# Patient Record
Sex: Female | Born: 1955 | Race: White | Hispanic: No | Smoking: Current every day smoker
Health system: Southern US, Community
[De-identification: ages and names within clinical notes are randomized; demographics above are authoritative.]

## PROBLEM LIST (undated history)

## (undated) DIAGNOSIS — J449 Chronic obstructive pulmonary disease, unspecified: Secondary | ICD-10-CM

## (undated) DIAGNOSIS — E7800 Pure hypercholesterolemia, unspecified: Secondary | ICD-10-CM

## (undated) DIAGNOSIS — K219 Gastro-esophageal reflux disease without esophagitis: Secondary | ICD-10-CM

## (undated) DIAGNOSIS — F329 Major depressive disorder, single episode, unspecified: Secondary | ICD-10-CM

## (undated) DIAGNOSIS — F32A Depression, unspecified: Secondary | ICD-10-CM

## (undated) DIAGNOSIS — I639 Cerebral infarction, unspecified: Secondary | ICD-10-CM

## (undated) DIAGNOSIS — I700 Atherosclerosis of aorta: Secondary | ICD-10-CM

## (undated) DIAGNOSIS — F419 Anxiety disorder, unspecified: Secondary | ICD-10-CM

## (undated) DIAGNOSIS — M797 Fibromyalgia: Secondary | ICD-10-CM

## (undated) DIAGNOSIS — I10 Essential (primary) hypertension: Secondary | ICD-10-CM

## (undated) HISTORY — DX: Pure hypercholesterolemia, unspecified: E78.00

## (undated) HISTORY — DX: Gastro-esophageal reflux disease without esophagitis: K21.9

## (undated) HISTORY — DX: Chronic obstructive pulmonary disease, unspecified: J44.9

## (undated) HISTORY — PX: TRACHEOSTOMY: SUR1362

## (undated) HISTORY — DX: Fibromyalgia: M79.7

## (undated) HISTORY — DX: Essential (primary) hypertension: I10

---

## 1997-08-12 ENCOUNTER — Ambulatory Visit (HOSPITAL_COMMUNITY): Admission: RE | Admit: 1997-08-12 | Discharge: 1997-08-12 | Payer: Self-pay | Admitting: Gastroenterology

## 1998-05-04 ENCOUNTER — Other Ambulatory Visit: Admission: RE | Admit: 1998-05-04 | Discharge: 1998-05-04 | Payer: Self-pay | Admitting: Obstetrics and Gynecology

## 1999-04-19 ENCOUNTER — Ambulatory Visit (HOSPITAL_COMMUNITY): Admission: RE | Admit: 1999-04-19 | Discharge: 1999-04-19 | Payer: Self-pay | Admitting: Obstetrics and Gynecology

## 1999-04-19 ENCOUNTER — Encounter: Payer: Self-pay | Admitting: Obstetrics and Gynecology

## 1999-08-06 ENCOUNTER — Other Ambulatory Visit: Admission: RE | Admit: 1999-08-06 | Discharge: 1999-08-06 | Payer: Self-pay | Admitting: Obstetrics and Gynecology

## 2000-10-27 ENCOUNTER — Other Ambulatory Visit: Admission: RE | Admit: 2000-10-27 | Discharge: 2000-10-27 | Payer: Self-pay | Admitting: Obstetrics and Gynecology

## 2001-11-09 ENCOUNTER — Other Ambulatory Visit: Admission: RE | Admit: 2001-11-09 | Discharge: 2001-11-09 | Payer: Self-pay | Admitting: Obstetrics and Gynecology

## 2005-01-09 ENCOUNTER — Emergency Department (HOSPITAL_COMMUNITY): Admission: EM | Admit: 2005-01-09 | Discharge: 2005-01-09 | Payer: Self-pay | Admitting: Emergency Medicine

## 2005-01-12 ENCOUNTER — Emergency Department (HOSPITAL_COMMUNITY): Admission: EM | Admit: 2005-01-12 | Discharge: 2005-01-12 | Payer: Self-pay | Admitting: Emergency Medicine

## 2006-05-25 ENCOUNTER — Ambulatory Visit: Payer: Self-pay | Admitting: Family Medicine

## 2006-05-26 ENCOUNTER — Ambulatory Visit: Payer: Self-pay | Admitting: *Deleted

## 2006-06-12 ENCOUNTER — Ambulatory Visit: Payer: Self-pay | Admitting: Family Medicine

## 2006-06-12 ENCOUNTER — Encounter (INDEPENDENT_AMBULATORY_CARE_PROVIDER_SITE_OTHER): Payer: Self-pay | Admitting: *Deleted

## 2013-05-30 ENCOUNTER — Encounter: Payer: Self-pay | Admitting: *Deleted

## 2013-05-30 ENCOUNTER — Ambulatory Visit (INDEPENDENT_AMBULATORY_CARE_PROVIDER_SITE_OTHER): Payer: Medicaid Other | Admitting: Neurology

## 2013-05-30 ENCOUNTER — Encounter (INDEPENDENT_AMBULATORY_CARE_PROVIDER_SITE_OTHER): Payer: Self-pay

## 2013-05-30 VITALS — BP 136/86 | HR 61 | Ht 61.0 in | Wt 109.0 lb

## 2013-05-30 DIAGNOSIS — R269 Unspecified abnormalities of gait and mobility: Secondary | ICD-10-CM

## 2013-05-30 DIAGNOSIS — R209 Unspecified disturbances of skin sensation: Secondary | ICD-10-CM

## 2013-05-30 DIAGNOSIS — G8110 Spastic hemiplegia affecting unspecified side: Secondary | ICD-10-CM

## 2013-05-30 DIAGNOSIS — I619 Nontraumatic intracerebral hemorrhage, unspecified: Secondary | ICD-10-CM | POA: Insufficient documentation

## 2013-05-30 DIAGNOSIS — G890 Central pain syndrome: Secondary | ICD-10-CM

## 2013-05-30 MED ORDER — TRAMADOL HCL 50 MG PO TABS: 50.0000 mg | ORAL_TABLET | Freq: Four times a day (QID) | ORAL | Status: DC | PRN

## 2013-05-30 MED ORDER — TOPIRAMATE 25 MG PO TABS: 25.0000 mg | ORAL_TABLET | Freq: Two times a day (BID) | ORAL | Status: DC

## 2013-05-30 NOTE — Progress Notes (Signed)
[facility] [address] [place]. [place] [address] [phone]       OFFICE CONSULT NOTE  [name] Date of Birth:  Dec 27, 1955 Medical Record Number:  [id number]   Referring MD:  self  Reason for Referral:  ICH f/u  HPI: 19 year Caucasian lady who who is seen today for transfer of neurological care as she has moved to [facility]  In February 2015. 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 I do not have details of hospital admission records but do have available for my review of neurological office visit note from  Doran Stabler, NP from [facility] in [place]. She was hospitalized with right thalamic hemorrhage with intraventricular extension. She initially stayed in the hospital and subsequently in a rehabilitation facility for couple of months and was admitted initially with a hemiwalker. She states she's done well and is not able to walk independently and has finished home physical occupational therapy. She still has left-sided weakness with spasticity and dragging of the left leg. She also has intermittent left facial pain as well as left upper extremity numbness and paresthesias which are bothersome. She takes tramadol 50 mg which seems to help. She tried gabapentin 100 mg 3 times daily but it did not work was admitted sleepy and she stopped it. She could not tolerate Lyrica as well. She feels she has lost some weight  And her appetite is poor. She does have mild depression she states she is coping quite well. She states her blood pressure has been quite well controlled and today it is 136/86 office. At home at times it's been high in the 180s. ROS:   14 system review of systems is positive for weight loss, fatigue, trouble swallowing, , snoring, constipation, easy bruising eye pain, snoring, constipation, increased thirst, flushing, joint pain, cramps, aching muscles,  allergies, skin sensitivity, memory loss, confusion, headache, numbness, dizziness, depression, tumors sleep, decreased energy, change in appetite, disinterest in activities, sleepiness, snoring, restless legs  PMH:  Past Medical History  Diagnosis Date  . Hypertension   . High cholesterol   . Fibromyalgia     Social History:  History   Social History  . Marital Status: Divorced    Spouse Name: N/A    Number of Children: 1  . Years of Education: N/A   Occupational History  . Disablity     Social History Main Topics  . Smoking status: Current Every Day Smoker  . Smokeless tobacco: Not on file  . Alcohol Use: Yes  . Drug Use: No  . Sexual Activity: Not on file   Other Topics Concern  . Not on file   Social History Narrative   Patient is right handed and lives at home with family.Patient drinks caffinated drinks.    Medications:   No current outpatient prescriptions on file prior to visit.   No current facility-administered medications on file prior to visit.    Allergies:   Allergies  Allergen Reactions  . Hydrocodone   . Oxycodone   . Sulfa Antibiotics     Physical Exam General: well developed, well nourished, seated, in no evident distress Head: head normocephalic and atraumatic. Orohparynx benign Neck: supple with no carotid or supraclavicular bruits.mild spasm posterior neck muscles. Cardiovascular: regular rate and rhythm, no murmurs Musculoskeletal: no deformity Skin:  no rash/petichiae Vascular:  Normal pulses all extremities Filed Vitals:   05/30/13 1341  BP: 136/86  Pulse: 61  Neurologic Exam Mental Status: Awake and fully alert. Oriented to place and time. Recent and remote memory intact. Attention span, concentration and fund of knowledge appropriate. Mood and affect appropriate. Mini-Mental status exam score 27/30 with deficits in recall naming and drawing. Animal naming test scores 6. Geriatric depression scale seventh suggestive of mild  depression. Clock drawing 4/4. Cranial Nerves: Fundoscopic exam reveals sharp disc margins. Pupils equal, briskly reactive to light. Extraocular movements full without nystagmus. Visual fields full to confrontation. Hearing intact. Facial sensation intact. Face, tongue, palate moves normally and symmetrically.  Motor: Mild left hemiparesis 4/5 strength with weakness of the left grip, intrinsic hand muscles and left ankle dorsiflexors. Increased tone in the left lower extremity. Normal strength on the right side Sensory.: intact to tough and pinprick and vibratory.  Coordination: Impaired finger-to-nose and knee to his coordination on the left side. Normal on the right side Gait and Station: Arises from chair without difficulty. spastic hemiplegic gait with circumduction and dragging of the left foot and mild left foot drop.  Reflexes: 2+ and asymmetric and brisker on the left. Toes downgoing.   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 Continue aggressive treatment of hypertension with blood pressure goal below 130/90. Continue tramadol for facial pain and try Topamax for neuropathic pain. Referred to outpatient physical and occupational therapy. Return for followup in 2 months with Carla FifeLynn, NP or. call earlier if necessary   Note: This document was prepared with digital dictation and possible smart phrase technology. Any transcriptional errors that result from this process are unintentional.

## 2013-05-30 NOTE — Patient Instructions (Signed)
I had a long discussion with the patient and her sister-in-law regarding her hypertensive intercerebral hemorrhage and post stroke thalamic pain and answered questions. Continue aggressive treatment of hypertension with blood pressure goal below 130/90. Continue tramadol for facial pain and try Topamax for neuropathic pain. Referred to outpatient physical and occupational therapy. Return for followup in 2 months with Larita FifeLynn, NP or. call earlier if necessary

## 2013-06-04 ENCOUNTER — Telehealth: Payer: Self-pay | Admitting: Neurology

## 2013-06-04 NOTE — Telephone Encounter (Signed)
Try to fit in earlier on Lynn`s schedule

## 2013-06-04 NOTE — Telephone Encounter (Signed)
Patient calling to state that she has been having difficulty with her leg feeling stiff and she is forced to use her walker which is unusual for her. Patient also states that the numbness in her mouth is getting worse. Please call and advise patient.

## 2013-06-04 NOTE — Telephone Encounter (Signed)
Pt is calling stating that she has been having difficulty with her leg feeling stiff and she is forced to use walker which is unusual for her. Pt also states that the numbness in her mouth is getting worse. Pt next appt is 09/10/13 with Larita FifeLynn, NP. Please advise

## 2013-06-04 NOTE — Telephone Encounter (Signed)
Called pt to make an appt with Dr. Pearlean BrownieSethi on 06/20/13, because Larita FifeLynn, NP did not have any appt available. I advised the pt that if she has any other problems, questions or concerns to call the office. Pt verbalized understanding.

## 2013-06-12 ENCOUNTER — Telehealth: Payer: Self-pay | Admitting: Neurology

## 2013-06-12 MED ORDER — VENLAFAXINE HCL 75 MG PO TABS: 75.0000 mg | ORAL_TABLET | Freq: Every day | ORAL | Status: DC

## 2013-06-12 NOTE — Telephone Encounter (Signed)
Patient states she does not have a PCP and would like to know if we can prescribe Venlafaxine.  Please advise.  Thank you.

## 2013-06-12 NOTE — Telephone Encounter (Signed)
Ok to do so but encourage her to find a PCP

## 2013-06-12 NOTE — Telephone Encounter (Signed)
Rx has been sent.  I spoke with the patient.  She is actively searching for a PCP.

## 2013-06-12 NOTE — Telephone Encounter (Signed)
Needs refill on generic Effexor

## 2013-06-17 ENCOUNTER — Telehealth: Payer: Self-pay | Admitting: Neurology

## 2013-06-17 NOTE — Telephone Encounter (Signed)
Pt called states she is having severe pain and numbness on the left side of her face during the night. Pt wants to know if Dr. Pearlean BrownieSethi would increase her dosage on her topiramate (TOPAMAX) 25 MG tablet &  traMADol (ULTRAM) 50 MG tablet. Please call pt concerning this matter. Thanks

## 2013-06-18 NOTE — Telephone Encounter (Signed)
I called the patient back.  Got no answer.  Left message. 

## 2013-06-18 NOTE — Telephone Encounter (Signed)
Okay to increase the Topamax to 50 mg twice daily and call back in 3 days

## 2013-06-20 ENCOUNTER — Ambulatory Visit: Payer: Self-pay | Admitting: Neurology

## 2013-06-24 ENCOUNTER — Telehealth: Payer: Self-pay | Admitting: Neurology

## 2013-06-24 NOTE — Telephone Encounter (Signed)
Pt called wanting her refill on amLODipine (NORVASC) 10 MG tablet. Please call once this has been called in. Thanks

## 2013-06-24 NOTE — Telephone Encounter (Signed)
This medication is typically prescribed by PCP.  I called the patient back, left message.

## 2013-06-25 ENCOUNTER — Telehealth: Payer: Self-pay

## 2013-06-25 ENCOUNTER — Other Ambulatory Visit: Payer: Self-pay | Admitting: *Deleted

## 2013-06-25 ENCOUNTER — Telehealth: Payer: Self-pay | Admitting: *Deleted

## 2013-06-25 MED ORDER — AMLODIPINE BESYLATE 10 MG PO TABS: 10.0000 mg | ORAL_TABLET | Freq: Every day | ORAL | Status: DC

## 2013-06-25 NOTE — Telephone Encounter (Signed)
Patient stated she does not currently have a PCP and would like to know if Dr Pearlean BrownieSethi would prescribe Norvasc for her.  She says she is trying to find a PCP, but has not been successful thus far.  Please advise.  Thank you.

## 2013-06-25 NOTE — Telephone Encounter (Signed)
Okay to prescribe till she finds a primary care physician within next 3 months

## 2013-06-25 NOTE — Telephone Encounter (Signed)
Pt stated she's been feeling ill since Sunday.  She's experiencing more mouth numbness, can't eat or keep anything down.  Pt requesting a return call regarding this issues.Marland Kitchen.Marland Kitchen.Thanks

## 2013-06-25 NOTE — Telephone Encounter (Signed)
I called and spoke with the patient.  Advised we will send refill, but she will need to establish PCP within the next 3 months.  She verbalized understanding.

## 2013-06-26 ENCOUNTER — Ambulatory Visit: Payer: Medicaid Other | Attending: Neurology | Admitting: Occupational Therapy

## 2013-06-26 ENCOUNTER — Ambulatory Visit: Payer: Medicaid Other

## 2013-06-26 DIAGNOSIS — R279 Unspecified lack of coordination: Secondary | ICD-10-CM | POA: Insufficient documentation

## 2013-06-26 DIAGNOSIS — Z8673 Personal history of transient ischemic attack (TIA), and cerebral infarction without residual deficits: Secondary | ICD-10-CM | POA: Insufficient documentation

## 2013-06-26 DIAGNOSIS — Z5189 Encounter for other specified aftercare: Secondary | ICD-10-CM | POA: Insufficient documentation

## 2013-06-26 DIAGNOSIS — R262 Difficulty in walking, not elsewhere classified: Secondary | ICD-10-CM | POA: Diagnosis not present

## 2013-06-26 NOTE — Telephone Encounter (Signed)
Called pt and pt stated that she was having numbness in her mouth and I asked the pt if she has an PCP and pt stated that she was going to see one on Saturday and that she was feeling a little better. I advised the pt that if she has any other problems, questions or concerns to call the office. Pt verbalized understanding.

## 2013-06-28 ENCOUNTER — Ambulatory Visit: Payer: Medicaid Other

## 2013-06-28 ENCOUNTER — Ambulatory Visit: Payer: Medicaid Other | Admitting: *Deleted

## 2013-07-02 ENCOUNTER — Encounter: Payer: Self-pay | Admitting: Neurology

## 2013-07-02 ENCOUNTER — Ambulatory Visit (INDEPENDENT_AMBULATORY_CARE_PROVIDER_SITE_OTHER): Payer: Medicaid Other | Admitting: Neurology

## 2013-07-02 VITALS — BP 161/100 | HR 62 | Wt 110.0 lb

## 2013-07-02 DIAGNOSIS — G890 Central pain syndrome: Secondary | ICD-10-CM

## 2013-07-02 DIAGNOSIS — R209 Unspecified disturbances of skin sensation: Secondary | ICD-10-CM

## 2013-07-02 MED ORDER — TOPIRAMATE 50 MG PO TABS: 100.0000 mg | ORAL_TABLET | Freq: Two times a day (BID) | ORAL | Status: DC

## 2013-07-02 NOTE — Progress Notes (Signed)
[facility] [address] [place]. [place] [id number] [phone]       OFFICE  FOLLOW UP VISIT NOTE  Carla Bowen Date of Birth:  27-Sep-1955 Medical Record Number:  [id number]   Referring MD:  self  Reason for Referral:  ICH f/u  HPI:  Update 07/02/2013 : She returns for followup of her last visit in a month ago. She is accompanied by sister. The patient reports persistent tightness and pain in the left base as well as burning and tingling in the left arm. She is tolerating Topamax 50 mg twice daily at present without side effects but still feels that the benefit has most been modest. She had an anxiety episode 2 weeks ago when she woke up from sleep with what appeared to be like a panic attack with crying, sweating, feeling scared. She does have a remote history of panic attacks and anxiety. She also takes Ultram for facial pain which seems to help but makes her sleepy and she takes it only at night. She has no other new complaints today. Initial Consult 05/30/2013 : [age] year Caucasian lady who who is seen today for transfer of neurological care as [place]  [place]. She was [place] in [place] with a right thalamic intracerebral hemorrhage on 12/28/12. I do not have details of hospital admission records but do have available for my review of neurological office visit note from  Doran StablerFaye Blasnak, NP from [facility] [place]. She was hospitalized with right thalamic hemorrhage with intraventricular extension. She initially stayed in the hospital and subsequently in a rehabilitation facility for couple of months and was admitted initially with a hemiwalker. She states she's done well and is not able to walk independently and has finished home physical occupational therapy. She still has left-sided weakness with spasticity and dragging of the left leg. She also has intermittent left facial pain as  well as left upper extremity numbness and paresthesias which are bothersome. She takes tramadol 50 mg which seems to help. She tried gabapentin 100 mg 3 times daily but it did not work was admitted sleepy and she stopped it. She could not tolerate Lyrica as well. She feels she has lost some weight  And her appetite is poor. She does have mild depression she states she is coping quite well. She states her blood pressure has been quite well controlled and today it is 136/86 office. At home at times it's been high in the 180s. ROS:   14 system review of systems is positive for appetite change, chills, fatigue, weight change, facial swelling, eye itching and pain, excessive thirst, nausea, daytime sleepiness, snoring, joint pain and back pain, aching muscles, walking difficulty, neck pain and stiffness, headache and numbness, agitation, confusion and anxiety and nervousness.  PMH:  Past Medical History  Diagnosis Date  . Hypertension   . High cholesterol   . Fibromyalgia Right thalamic intracerebral hemorrhage on October 2014      Social History:  History   Social History  . Marital Status: Divorced    Spouse Name: N/A    Number of Children: 1  . Years of Education: N/A   Occupational History  . Disablity     Social History Main Topics  . Smoking status: Current Every Day Smoker  . Smokeless tobacco: Not on file  . Alcohol Use: Yes  . Drug Use: No  . Sexual Activity: Not on file   Other  Topics Concern  . Not on file   Social History Narrative   Patient is right handed and lives at home with family.Patient drinks caffinated drinks.    Medications:   Current Outpatient Prescriptions on File Prior to Visit  Medication Sig Dispense Refill  . amLODipine (NORVASC) 10 MG tablet Take 1 tablet (10 mg total) by mouth daily.  90 tablet  0  . lisinopril (PRINIVIL,ZESTRIL) 20 MG tablet Take 20 mg by mouth daily.      . traMADol (ULTRAM) 50 MG tablet Take 1 tablet (50 mg total) by mouth  every 6 (six) hours as needed.  30 tablet  1  . venlafaxine (EFFEXOR) 75 MG tablet Take 1 tablet (75 mg total) by mouth daily.  90 tablet  0   No current facility-administered medications on file prior to visit.    Allergies:   Allergies  Allergen Reactions  . Hydrocodone   . Oxycodone   . Sulfa Antibiotics     Physical Exam General: well developed, well nourished, seated, in no evident distress Head: head normocephalic and atraumatic. Orohparynx benign Neck: supple with no carotid or supraclavicular bruits.mild spasm posterior neck muscles. Cardiovascular: regular rate and rhythm, no murmurs Musculoskeletal: no deformity Skin:  no rash/petichiae Vascular:  Normal pulses all extremities Filed Vitals:   07/02/13 1600  BP: 161/100  Pulse: 62    Neurologic Exam Mental Status: Awake and fully alert. Oriented to place and time. Recent and remote memory intact. Attention span, concentration and fund of knowledge appropriate. Mood and affect appropriate. Mini-Mental status exam score 27/30 with deficits in recall naming and drawing. Animal naming test scores 6. Geriatric depression scale seventh suggestive of mild depression. Clock drawing 4/4. Cranial Nerves: Fundoscopic exam reveals sharp disc margins. Pupils equal, briskly reactive to light. Extraocular movements full without nystagmus. Visual fields full to confrontation. Hearing intact. Facial sensation intact. Face, tongue, palate moves normally and symmetrically.  Motor: Mild left hemiparesis 4/5 strength with weakness of the left grip, intrinsic hand muscles and left ankle dorsiflexors. Increased tone in the left lower extremity. Normal strength on the right side Sensory.: intact to tough and pinprick and vibration.  Coordination: Impaired finger-to-nose and knee to his coordination on the left side. Normal on the right side Gait and Station: Arises from chair without difficulty. spastic hemiplegic gait with circumduction and  dragging of the left foot and mild left foot drop.  Reflexes: 2+ and asymmetric and brisker on the left. Toes downgoing.      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 I recommend increasing the Topamax to 50 mg in the morning and 100 nightly for 2 weeks and if still no relief and well-tolerated increase further to 100 mg twice daily. I also advised her to decrease tramadol dose to 12.5 mg every 6 hourly when necessary necessary for pain. Maintain strict control of hypertension with goal below 140/90. Avoid falls and use cane.Return for followup in 3 months or call earlier if necessary.  Note: This document was prepared with digital dictation and possible smart phrase technology. Any transcriptional errors that result from this process are unintentional.

## 2013-07-02 NOTE — Patient Instructions (Signed)
I had a long discussion with the patient and her sister with regardless to her post thalamic pain syndrome,discussed treatment options and answered questions. I recommend increasing the Topamax to 50 mg in the morning and 100 nightly for 2 weeks and if still no relief and well-tolerated increase further to 100 mg twice daily. I also advised her to decrease tramadol dose to 12.5 mg every 6 hourly when necessary necessary for pain. Maintain strict control of hypertension with goal below 140/90. Avoid falls and use cane.Return for followup in 3 months or call earlier if necessary.

## 2013-07-08 ENCOUNTER — Ambulatory Visit: Payer: Medicaid Other

## 2013-07-08 DIAGNOSIS — Z5189 Encounter for other specified aftercare: Secondary | ICD-10-CM | POA: Diagnosis not present

## 2013-07-10 ENCOUNTER — Ambulatory Visit: Payer: Medicaid Other

## 2013-07-11 ENCOUNTER — Ambulatory Visit: Payer: Medicaid Other | Admitting: Occupational Therapy

## 2013-07-11 DIAGNOSIS — Z5189 Encounter for other specified aftercare: Secondary | ICD-10-CM | POA: Diagnosis not present

## 2013-07-16 ENCOUNTER — Telehealth: Payer: Self-pay | Admitting: Neurology

## 2013-07-16 ENCOUNTER — Ambulatory Visit: Payer: Medicaid Other | Attending: Neurology | Admitting: Occupational Therapy

## 2013-07-16 ENCOUNTER — Ambulatory Visit: Payer: Medicaid Other

## 2013-07-16 DIAGNOSIS — Z8673 Personal history of transient ischemic attack (TIA), and cerebral infarction without residual deficits: Secondary | ICD-10-CM | POA: Diagnosis not present

## 2013-07-16 DIAGNOSIS — R262 Difficulty in walking, not elsewhere classified: Secondary | ICD-10-CM | POA: Diagnosis not present

## 2013-07-16 DIAGNOSIS — R279 Unspecified lack of coordination: Secondary | ICD-10-CM | POA: Insufficient documentation

## 2013-07-16 DIAGNOSIS — Z5189 Encounter for other specified aftercare: Secondary | ICD-10-CM | POA: Insufficient documentation

## 2013-07-16 NOTE — Telephone Encounter (Signed)
Patient requesting a call back from Dr. Marlis EdelsonSethi's assistant regarding getting a primary doctor referral.  She did call Medicaid but they di not do referrals for PCP, only behavioral health.  The patient needs prescription for lisinopril. She is totally out. Please call to advise.

## 2013-07-18 ENCOUNTER — Ambulatory Visit: Payer: Medicaid Other

## 2013-07-18 ENCOUNTER — Ambulatory Visit: Payer: Medicaid Other | Admitting: Occupational Therapy

## 2013-07-18 ENCOUNTER — Telehealth: Payer: Self-pay | Admitting: Neurology

## 2013-07-18 DIAGNOSIS — Z5189 Encounter for other specified aftercare: Secondary | ICD-10-CM | POA: Diagnosis not present

## 2013-07-18 MED ORDER — LISINOPRIL 20 MG PO TABS: 20.0000 mg | ORAL_TABLET | Freq: Every day | ORAL | Status: DC

## 2013-07-18 NOTE — Telephone Encounter (Signed)
Dr Pearlean BrownieSethi previously (last month) said we could refill BP med while patient is trying to get PCP.  I called and spoke with the patient.  She is aware.  She is waiting for a call back regarding PCP referral.

## 2013-07-18 NOTE — Telephone Encounter (Signed)
Patient requesting refill of lisinopril. She does not have a PCP in the area yet and is totally out of medication. Please call to advise.

## 2013-07-22 ENCOUNTER — Ambulatory Visit: Payer: Medicaid Other

## 2013-07-22 ENCOUNTER — Ambulatory Visit: Payer: Medicaid Other | Admitting: Occupational Therapy

## 2013-07-22 ENCOUNTER — Telehealth: Payer: Self-pay | Admitting: Neurology

## 2013-07-22 DIAGNOSIS — Z5189 Encounter for other specified aftercare: Secondary | ICD-10-CM | POA: Diagnosis not present

## 2013-07-22 NOTE — Telephone Encounter (Signed)
Called patient and let her know to call social service they can help and direct her.

## 2013-07-22 NOTE — Telephone Encounter (Signed)
Patient calling Larita FifeLynn to inform her that she's still experiencing severe left face and mouth pain.  Please call and advise. Thanks

## 2013-07-23 MED ORDER — GABAPENTIN 300 MG PO CAPS: 300.0000 mg | ORAL_CAPSULE | Freq: Three times a day (TID) | ORAL | Status: DC

## 2013-07-23 NOTE — Telephone Encounter (Signed)
DC topamax. Start gabapentin 300 mg twice daily x 3 days then TID

## 2013-07-23 NOTE — Telephone Encounter (Signed)
Called and left message for more information on face and mouth pain

## 2013-07-23 NOTE — Telephone Encounter (Signed)
Spoke with patient and she said that the pain is so great that she can hardly swallow or take her medicines,dosage was increased by Dr Pearlean BrownieSethi last week but not helping. She is concerned now also that her medicaid is about to discontinue at the end of the month

## 2013-07-24 ENCOUNTER — Ambulatory Visit: Payer: Medicaid Other

## 2013-07-24 ENCOUNTER — Telehealth: Payer: Self-pay | Admitting: *Deleted

## 2013-07-24 ENCOUNTER — Encounter: Payer: Medicaid Other | Admitting: Occupational Therapy

## 2013-07-24 MED ORDER — AMITRIPTYLINE HCL 25 MG PO TABS: ORAL_TABLET | ORAL | Status: DC

## 2013-07-24 NOTE — Telephone Encounter (Signed)
See next message

## 2013-07-24 NOTE — Telephone Encounter (Addendum)
I called pt, LMVM and relayed that Dr. Pearlean BrownieSethi will try amitriptyline 25mg  tabs.  Take 1/2 tablet po qhs x 2wks then go to 1 tablet po qhs.  Pt stated that she had tried the gabapentin 200-400mg  po tid previously and this did not help.  Mentioned MRI.  Dr. Pearlean BrownieSethi did not want to do at this time.

## 2013-07-25 NOTE — Telephone Encounter (Signed)
Ok to try for short time

## 2013-07-29 ENCOUNTER — Ambulatory Visit: Payer: Medicaid Other | Admitting: Occupational Therapy

## 2013-07-29 ENCOUNTER — Telehealth: Payer: Self-pay | Admitting: Neurology

## 2013-07-29 ENCOUNTER — Ambulatory Visit: Payer: Medicaid Other

## 2013-07-29 DIAGNOSIS — Z5189 Encounter for other specified aftercare: Secondary | ICD-10-CM | POA: Diagnosis not present

## 2013-07-30 MED ORDER — GABAPENTIN 100 MG PO CAPS: ORAL_CAPSULE | ORAL | Status: DC

## 2013-07-30 NOTE — Telephone Encounter (Signed)
I have called her, she complains of left facial pain, gabapentin was helping, but 300 mg 3 times a day  has made her too sleepy, I have written her a new prescription gabapentin 100 mg, she can titrating the dosage up gradually from 100 mg 3 times a day, to maximum 3 tablets 3 times a day, new prescription was called in

## 2013-08-01 ENCOUNTER — Ambulatory Visit: Payer: Medicaid Other | Admitting: Occupational Therapy

## 2013-08-01 ENCOUNTER — Ambulatory Visit: Payer: Medicaid Other

## 2013-08-01 DIAGNOSIS — Z5189 Encounter for other specified aftercare: Secondary | ICD-10-CM | POA: Diagnosis not present

## 2013-08-07 ENCOUNTER — Ambulatory Visit: Payer: Medicaid Other

## 2013-08-07 ENCOUNTER — Encounter: Payer: Medicaid Other | Admitting: Occupational Therapy

## 2013-08-08 ENCOUNTER — Emergency Department (HOSPITAL_COMMUNITY)
Admission: EM | Admit: 2013-08-08 | Discharge: 2013-08-08 | Disposition: A | Discharge status: Home / Self Care | Payer: Medicaid Other | Attending: Emergency Medicine | Admitting: Emergency Medicine

## 2013-08-08 ENCOUNTER — Telehealth: Payer: Self-pay | Admitting: Neurology

## 2013-08-08 ENCOUNTER — Emergency Department (HOSPITAL_COMMUNITY): Payer: Medicaid Other

## 2013-08-08 ENCOUNTER — Encounter (HOSPITAL_COMMUNITY): Payer: Self-pay | Admitting: Emergency Medicine

## 2013-08-08 DIAGNOSIS — Z79899 Other long term (current) drug therapy: Secondary | ICD-10-CM | POA: Insufficient documentation

## 2013-08-08 DIAGNOSIS — I10 Essential (primary) hypertension: Secondary | ICD-10-CM

## 2013-08-08 DIAGNOSIS — R112 Nausea with vomiting, unspecified: Secondary | ICD-10-CM | POA: Insufficient documentation

## 2013-08-08 DIAGNOSIS — F17200 Nicotine dependence, unspecified, uncomplicated: Secondary | ICD-10-CM | POA: Insufficient documentation

## 2013-08-08 DIAGNOSIS — Z8673 Personal history of transient ischemic attack (TIA), and cerebral infarction without residual deficits: Secondary | ICD-10-CM | POA: Insufficient documentation

## 2013-08-08 DIAGNOSIS — Z8639 Personal history of other endocrine, nutritional and metabolic disease: Secondary | ICD-10-CM | POA: Insufficient documentation

## 2013-08-08 DIAGNOSIS — R109 Unspecified abdominal pain: Secondary | ICD-10-CM | POA: Insufficient documentation

## 2013-08-08 DIAGNOSIS — Z862 Personal history of diseases of the blood and blood-forming organs and certain disorders involving the immune mechanism: Secondary | ICD-10-CM | POA: Insufficient documentation

## 2013-08-08 HISTORY — DX: Cerebral infarction, unspecified: I63.9

## 2013-08-08 LAB — URINALYSIS, ROUTINE W REFLEX MICROSCOPIC
Bilirubin Urine: NEGATIVE
Glucose, UA: NEGATIVE mg/dL
Hgb urine dipstick: NEGATIVE
Ketones, ur: NEGATIVE mg/dL
Leukocytes, UA: NEGATIVE
Nitrite: NEGATIVE
Protein, ur: NEGATIVE mg/dL
Specific Gravity, Urine: 1.004 — ABNORMAL LOW (ref 1.005–1.030)
Urobilinogen, UA: 0.2 mg/dL (ref 0.0–1.0)
pH: 7.5 (ref 5.0–8.0)

## 2013-08-08 LAB — CBC WITH DIFFERENTIAL/PLATELET
Basophils Absolute: 0 10*3/uL (ref 0.0–0.1)
Basophils Relative: 0 % (ref 0–1)
Eosinophils Absolute: 0 10*3/uL (ref 0.0–0.7)
Eosinophils Relative: 0 % (ref 0–5)
HCT: 39.6 % (ref 36.0–46.0)
Hemoglobin: 13.4 g/dL (ref 12.0–15.0)
Lymphocytes Relative: 28 % (ref 12–46)
Lymphs Abs: 1.9 10*3/uL (ref 0.7–4.0)
MCH: 30.7 pg (ref 26.0–34.0)
MCHC: 33.8 g/dL (ref 30.0–36.0)
MCV: 90.8 fL (ref 78.0–100.0)
Monocytes Absolute: 0.3 10*3/uL (ref 0.1–1.0)
Monocytes Relative: 5 % (ref 3–12)
Neutro Abs: 4.5 10*3/uL (ref 1.7–7.7)
Neutrophils Relative %: 67 % (ref 43–77)
Platelets: 230 10*3/uL (ref 150–400)
RBC: 4.36 MIL/uL (ref 3.87–5.11)
RDW: 13.1 % (ref 11.5–15.5)
WBC: 6.8 10*3/uL (ref 4.0–10.5)

## 2013-08-08 LAB — COMPREHENSIVE METABOLIC PANEL
ALT: 9 U/L (ref 0–35)
AST: 13 U/L (ref 0–37)
Albumin: 4 g/dL (ref 3.5–5.2)
Alkaline Phosphatase: 87 U/L (ref 39–117)
BUN: 11 mg/dL (ref 6–23)
CO2: 27 mEq/L (ref 19–32)
Calcium: 10.1 mg/dL (ref 8.4–10.5)
Chloride: 105 mEq/L (ref 96–112)
Creatinine, Ser: 0.52 mg/dL (ref 0.50–1.10)
GFR calc Af Amer: >90 mL/min (ref >90)
GFR calc non Af Amer: >90 mL/min (ref >90)
Glucose, Bld: 116 mg/dL — ABNORMAL HIGH (ref 70–99)
Potassium: 3.9 mEq/L (ref 3.7–5.3)
Sodium: 144 mEq/L (ref 137–147)
Total Bilirubin: 0.3 mg/dL (ref 0.3–1.2)
Total Protein: 7.2 g/dL (ref 6.0–8.3)

## 2013-08-08 LAB — TROPONIN I: Troponin I: <0.3 ng/mL (ref <0.30)

## 2013-08-08 LAB — CBG MONITORING, ED: Glucose-Capillary: 91 mg/dL (ref 70–99)

## 2013-08-08 LAB — LIPASE, BLOOD: Lipase: 17 U/L (ref 11–59)

## 2013-08-08 MED ORDER — HYDRALAZINE HCL 20 MG/ML IJ SOLN
10.0000 mg | Freq: Once | INTRAMUSCULAR | Status: AC
  Administered 2013-08-08: 10 mg via INTRAVENOUS
  Filled 2013-08-08: qty 0.5

## 2013-08-08 MED ORDER — FENTANYL CITRATE 0.05 MG/ML IJ SOLN
50.0000 ug | Freq: Once | INTRAMUSCULAR | Status: AC
  Administered 2013-08-08: 50 ug via INTRAVENOUS
  Filled 2013-08-08: qty 2

## 2013-08-08 MED ORDER — LANSOPRAZOLE 30 MG PO CPDR: 30.0000 mg | DELAYED_RELEASE_CAPSULE | Freq: Every day | ORAL | Status: DC

## 2013-08-08 MED ORDER — PROMETHAZINE HCL 25 MG/ML IJ SOLN
25.0000 mg | Freq: Once | INTRAMUSCULAR | Status: AC
  Administered 2013-08-08: 25 mg via INTRAVENOUS
  Filled 2013-08-08: qty 1

## 2013-08-08 MED ORDER — IOHEXOL 300 MG/ML  SOLN
100.0000 mL | Freq: Once | INTRAMUSCULAR | Status: AC | PRN
  Administered 2013-08-08: 80 mL via INTRAVENOUS

## 2013-08-08 MED ORDER — PROMETHAZINE HCL 25 MG PO TABS: 25.0000 mg | ORAL_TABLET | Freq: Four times a day (QID) | ORAL | Status: DC | PRN

## 2013-08-08 MED ORDER — SODIUM CHLORIDE 0.9 % IV BOLUS (SEPSIS)
500.0000 mL | Freq: Once | INTRAVENOUS | Status: AC
  Administered 2013-08-08: 500 mL via INTRAVENOUS

## 2013-08-08 MED ORDER — ONDANSETRON HCL 4 MG/2ML IJ SOLN
4.0000 mg | Freq: Once | INTRAMUSCULAR | Status: AC
  Administered 2013-08-08: 4 mg via INTRAVENOUS
  Filled 2013-08-08: qty 2

## 2013-08-08 NOTE — ED Notes (Signed)
MD notified regarding patient's BP.

## 2013-08-08 NOTE — ED Provider Notes (Signed)
CSN: [id number]     Arrival date & time 08/08/13  1623 History   First MD Initiated Contact with Patient 08/08/13 1627     Chief Complaint  Patient presents with  . Abdominal Pain  . Emesis     (Consider location/radiation/quality/duration/timing/severity/associated sxs/prior Treatment) Patient is a 58 y.o. female presenting with abdominal pain and vomiting. The history is provided by the patient.  Abdominal Pain Associated symptoms: nausea and vomiting   Associated symptoms: no chest pain, no constipation, no diarrhea and no shortness of breath   Emesis Associated symptoms: abdominal pain   Associated symptoms: no diarrhea and no headaches    patient presents with upper abdominal pain. It is over the entire abdomen, but worsened the operating room. She's had nausea and vomiting with some mild relief after vomiting. She denies fevers. The pain is constant. It was dull. She has had a decreased appetite. The pain is dull. No masses. She's had a previous cesarean section.  Past Medical History  Diagnosis Date  . Hypertension   . High cholesterol   . Fibromyalgia   . Stroke    Past Surgical History  Procedure Laterality Date  . Cesarean section     Family History  Problem Relation Age of Onset  . Diabetes Brother   . Heart attack Brother   . Hypertension Brother   . Hypertension Sister   . Lung cancer Father    History  Substance Use Topics  . Smoking status: Current Every Day Smoker  . Smokeless tobacco: Not on file  . Alcohol Use: Yes   OB History   Grav Para Term Preterm Abortions TAB SAB Ect Mult Living                 Review of Systems  Constitutional: Negative for activity change and appetite change.  Eyes: Negative for pain.  Respiratory: Negative for chest tightness and shortness of breath.   Cardiovascular: Negative for chest pain and leg swelling.  Gastrointestinal: Positive for nausea, vomiting and abdominal pain. Negative for diarrhea and constipation.   Genitourinary: Negative for flank pain.  Musculoskeletal: Negative for back pain and neck stiffness.  Skin: Negative for rash.  Neurological: Negative for weakness, numbness and headaches.  Psychiatric/Behavioral: Negative for behavioral problems.      Allergies  Hydrocodone; Oxycodone; and Sulfa antibiotics  Home Medications   Prior to Admission medications   Medication Sig Start Date End Date Taking? Authorizing Provider  amLODipine (NORVASC) 10 MG tablet Take 1 tablet (10 mg total) by mouth daily. 06/25/13  Yes Micki Riley, MD  gabapentin (NEURONTIN) 100 MG capsule Take 200 mg by mouth 3 (three) times daily. 2 tablets bid 07/30/13  Yes Levert Feinstein, MD  ibuprofen (ADVIL,MOTRIN) 200 MG tablet Take 400 mg by mouth every 6 (six) hours as needed (pain).   Yes Historical Provider, MD  lisinopril (PRINIVIL,ZESTRIL) 20 MG tablet Take 1 tablet (20 mg total) by mouth daily. 07/18/13  Yes Micki Riley, MD  traMADol (ULTRAM) 50 MG tablet Take 50 mg by mouth every 6 (six) hours as needed (pain). 05/30/13  Yes Micki Riley, MD  venlafaxine (EFFEXOR) 75 MG tablet Take 1 tablet (75 mg total) by mouth daily. 06/12/13  Yes Micki Riley, MD  lansoprazole (PREVACID) 30 MG capsule Take 1 capsule (30 mg total) by mouth daily at 12 noon. 08/08/13   Juliet Rude. Jovahn Breit, MD  promethazine (PHENERGAN) 25 MG tablet Take 1 tablet (25 mg total) by mouth every 6 (  six) hours as needed for nausea. 08/08/13   Juliet RudeNathan R. Dailey Buccheri, MD   BP 144/64  Pulse 62  Temp(Src) 99 F (37.2 C) (Oral)  Resp 16  SpO2 96% Physical Exam  Nursing note and vitals reviewed. Constitutional: She is oriented to person, place, and time. She appears well-developed and well-nourished.  HENT:  Head: Normocephalic and atraumatic.  Eyes: EOM are normal. Pupils are equal, round, and reactive to light.  Neck: Normal range of motion. Neck supple.  Cardiovascular: Normal rate, regular rhythm and normal heart sounds.   No murmur  heard. Pulmonary/Chest: Effort normal and breath sounds normal. No respiratory distress. She has no wheezes. She has no rales.  Abdominal: Soft. She exhibits no distension. There is tenderness. There is no rebound and no guarding.  Diffuse abdominal tenderness, worse in the upper abdomen and somewhat worse on left abdomen. No hernias palpated. No rash.  Musculoskeletal: Normal range of motion.  Neurological: She is alert and oriented to person, place, and time. No cranial nerve deficit.  Skin: Skin is warm and dry.  Psychiatric: She has a normal mood and affect. Her speech is normal.    ED Course  Procedures (including critical care time) Labs Review Labs Reviewed  COMPREHENSIVE METABOLIC PANEL - Abnormal; Notable for the following:    Glucose, Bld 116 (*)    All other components within normal limits  URINALYSIS, ROUTINE W REFLEX MICROSCOPIC - Abnormal; Notable for the following:    Specific Gravity, Urine 1.004 (*)    All other components within normal limits  CBC WITH DIFFERENTIAL  LIPASE, BLOOD  TROPONIN I  CBG MONITORING, ED    Imaging Review [place] Abdomen Complete  08/08/2013   CLINICAL DATA:  Abdominal pain, vomiting  EXAM: ULTRASOUND ABDOMEN COMPLETE  COMPARISON:  CT abdomen and pelvis 08/08/2013  FINDINGS: Gallbladder:  Normally distended without stones or wall thickening.  No pericholecystic fluid or sonographic Murphy sign.  Common bile duct:  Diameter: Normal caliber 4 mm diameter  Liver:  Normal appearance  IVC:  Normal appearance  Pancreas:  Normal appearance  Spleen:  Normal appearance, 8.1 cm length  Right Kidney:  Length: 9.5 cm. Normal morphology without solid mass or hydronephrosis. Small cyst at upper pole 8 mm greatest size, also seen on prior CT.  Left Kidney:  Length: 11.1 cm.  Normal morphology without mass or hydronephrosis.  Abdominal aorta:  Normal caliber  Other findings:  No free-fluid  IMPRESSION: Small RIGHT renal cyst.  Otherwise normal exam.   Electronically  Signed   By: Ulyses SouthwardMark  Boles M.D.   On: 08/08/2013 22:26   Ct Abdomen Pelvis W Contrast  08/08/2013   CLINICAL DATA:  Abdominal pain, nausea and vomiting  EXAM: CT ABDOMEN AND PELVIS WITH CONTRAST  TECHNIQUE: Multidetector CT imaging of the abdomen and pelvis was performed using the standard protocol following bolus administration of intravenous contrast.  CONTRAST:  80mL OMNIPAQUE IOHEXOL 300 MG/ML  SOLN  COMPARISON:  01/09/2005  FINDINGS: Sagittal images of the spine shows disc space flattening with vacuum disc phenomenon and mild posterior disc bulge at L5-S1 level. Mild disc space flattening with mild anterior spurring at L2-L3 level. Lung bases are unremarkable. Minimal fatty infiltration of the liver. Gallbladder is contracted without calcified gallstones. The pancreas spleen and adrenal glands are unremarkable. Kidneys are symmetrical in size and enhancement. No hydronephrosis or hydroureter. There is a cyst in upper pole of the right kidney measures 1.3 cm. There is punctate nonobstructive calcified calculus in upper pole  of the right kidney measures 2 mm.  No hydronephrosis or hydroureter.  No aortic aneurysm.  No small bowel obstruction. No pericecal inflammation. Normal appendix.  The uterus and adnexa is unremarkable. Moderate stool noted within rectum. The urinary bladder is unremarkable.  Delayed renal images shows bilateral renal symmetrical excretion. Bilateral visualized proximal ureter is unremarkable.  IMPRESSION: 1. No acute inflammatory process within abdomen or pelvis. 2. No pericecal inflammation.  Normal appendix. 3. No hydronephrosis or hydroureter. Right nonobstructive nephrolithiasis. 4. No small bowel obstruction.   Electronically Signed   By: Natasha Mead M.D.   On: 08/08/2013 18:40     EKG Interpretation None      MDM   Final diagnoses:  Abdominal pain  Hypertension    Patient with upper abdominal pain. Also had some nausea and vomiting. Lab work and CT reassuring.  Ultrasound done that continued pain it was also reassuring. Patient did have hypertension but improved with treatment. Patient did develop mild headache. Blood pressure much improved. 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 Will discharge home with GI and primary care followup    Juliet Rude. Rubin Payor, MD 08/09/13 [phone]

## 2013-08-08 NOTE — ED Notes (Signed)
Bed: [facility] Expected date:  Expected time:  Means of arrival:  Comments: ems- HTN, n/v

## 2013-08-08 NOTE — ED Notes (Signed)
Per EMS- left sided weakness residual from past stroke and hx HTN. Chief complaint: abd pain since this morning, sharp peri umbilical with N,V. 22G Right Hand. Not taken HTN meds today because she keeps throwing it up. BP 169/123 HR 63 NSR RR 16 SPO2 98% CBG 83. 4 mg zofran given.

## 2013-08-08 NOTE — Telephone Encounter (Signed)
Patient calling requesting additional refills of lisinopril (PRINIVIL,ZESTRIL) 20 MG tabletm, due to medicaid will expire at end of month (refilled on 5/7).  Please call and advise

## 2013-08-08 NOTE — Telephone Encounter (Signed)
I called the patient back.  She realized she already has a Rx on file at [facility].  Nothing further is needed.

## 2013-08-08 NOTE — Discharge Instructions (Signed)
Abdominal Pain, Adult Many things can cause abdominal pain. Usually, abdominal pain is not caused by a disease and will improve without treatment. It can often be observed and treated at home. Your health care provider will do a physical exam and possibly order blood tests and X-rays to help determine the seriousness of your pain. However, in many cases, more time must pass before a clear cause of the pain can be found. Before that point, your health care provider may not know if you need more testing or further treatment. HOME CARE INSTRUCTIONS  Monitor your abdominal pain for any changes. The following actions may help to alleviate any discomfort you are experiencing:  Only take over-the-counter or prescription medicines as directed by your health care provider.  Do not take laxatives unless directed to do so by your health care provider.  Try a clear liquid diet (broth, tea, or water) as directed by your health care provider. Slowly move to a bland diet as tolerated. SEEK MEDICAL CARE IF:  You have unexplained abdominal pain.  You have abdominal pain associated with nausea or diarrhea.  You have pain when you urinate or have a bowel movement.  You experience abdominal pain that wakes you in the night.  You have abdominal pain that is worsened or improved by eating food.  You have abdominal pain that is worsened with eating fatty foods. SEEK IMMEDIATE MEDICAL CARE IF:   Your pain does not go away within 2 hours.  You have a fever.  You keep throwing up (vomiting).  Your pain is felt only in portions of the abdomen, such as the right side or the left lower portion of the abdomen.  You pass bloody or black tarry stools. MAKE SURE YOU:  Understand these instructions.   Will watch your condition.   Will get help right away if you are not doing well or get worse.  Document Released: 12/08/2004 Document Revised: 12/19/2012 Document Reviewed: 11/07/2012 [facility] Patient  Information 2014 [place], [place].  Hypertension As your heart beats, it forces blood through your arteries. This force is your blood pressure. If the pressure is too high, it is called hypertension (HTN) or high blood pressure. HTN is dangerous because you may have it and not know it. High blood pressure may mean that your heart has to work harder to pump blood. Your arteries may be narrow or stiff. The extra work puts you at risk for heart disease, stroke, and other problems.  Blood pressure consists of two numbers, a higher number over a lower, 110/72, for example. It is stated as "110 over 72." The ideal is below 120 for the top number (systolic) and under 80 for the bottom (diastolic). Write down your blood pressure today. You should pay close attention to your blood pressure if you have certain conditions such as:  Heart failure.  Prior heart attack.  Diabetes  Chronic kidney disease.  Prior stroke.  Multiple risk factors for heart disease. To see if you have HTN, your blood pressure should be measured while you are seated with your arm held at the level of the heart. It should be measured at least twice. A one-time elevated blood pressure reading (especially in the Emergency Department) does not mean that you need treatment. There may be conditions in which the blood pressure is different between your right and left arms. It is important to see your caregiver soon for a recheck. Most people have essential hypertension which means that there is not a specific  cause. This type of high blood pressure may be lowered by changing lifestyle factors such as:  Stress.  Smoking.  Lack of exercise.  Excessive weight.  Drug/tobacco/alcohol use.  Eating less salt. Most people do not have symptoms from high blood pressure until it has caused damage to the body. Effective treatment can often prevent, delay or reduce that damage. TREATMENT  When a cause has been identified, treatment for high  blood pressure is directed at the cause. There are a large number of medications to treat HTN. These fall into several categories, and your caregiver will help you select the medicines that are best for you. Medications may have side effects. You should review side effects with your caregiver. If your blood pressure stays high after you have made lifestyle changes or started on medicines,   Your medication(s) may need to be changed.  Other problems may need to be addressed.  Be certain you understand your prescriptions, and know how and when to take your medicine.  Be sure to follow up with your caregiver within the time frame advised (usually within two weeks) to have your blood pressure rechecked and to review your medications.  If you are taking more than one medicine to lower your blood pressure, make sure you know how and at what times they should be taken. Taking two medicines at the same time can result in blood pressure that is too low. SEEK IMMEDIATE MEDICAL CARE IF:  You develop a severe headache, blurred or changing vision, or confusion.  You have unusual weakness or numbness, or a faint feeling.  You have severe chest or abdominal pain, vomiting, or breathing problems. MAKE SURE YOU:   Understand these instructions.  Will watch your condition.  Will get help right away if you are not doing well or get worse. Document Released: 02/28/2005 Document Revised: 05/23/2011 Document Reviewed: 10/19/2007 [facility] Patient Information 2014 [place], [place].

## 2013-08-08 NOTE — ED Notes (Signed)
Initial contact-pt alert and oriented x4. C/o N, V since yesterday. Denies blood in vomit and denies being around anyone sick. Pt tearful and in pain. MD at bedside.

## 2013-08-21 ENCOUNTER — Telehealth: Payer: Self-pay | Admitting: Neurology

## 2013-08-22 NOTE — Telephone Encounter (Signed)
Spoke with patient and advised her to contact PCP or go to ER for the shoulder tightness since Dr Pearlean Brownie has not seen her for this problem. She said that she does not have a primary, does not wish to go to ER, has no insurance, will not have coverage until July.

## 2013-09-10 ENCOUNTER — Ambulatory Visit: Payer: Medicaid Other | Admitting: Nurse Practitioner

## 2013-09-23 ENCOUNTER — Telehealth: Payer: Self-pay | Admitting: *Deleted

## 2013-09-23 MED ORDER — LISINOPRIL 20 MG PO TABS: 20.0000 mg | ORAL_TABLET | Freq: Every day | ORAL | Status: DC

## 2013-09-23 NOTE — Telephone Encounter (Signed)
Dr Pearlean BrownieSethi previously said we could refill med until patient finds PCP.  I called and spoke with the patient.  She said she will have ins next month and will have a PCP then.  Advised her she will need to request future fills from PCP.  Patient verbalized understanding.

## 2013-10-05 ENCOUNTER — Other Ambulatory Visit: Payer: Self-pay | Admitting: Neurology

## 2013-10-10 ENCOUNTER — Ambulatory Visit: Payer: Medicaid Other | Admitting: Nurse Practitioner

## 2013-10-17 NOTE — Telephone Encounter (Signed)
Noted  

## 2013-10-18 ENCOUNTER — Ambulatory Visit: Payer: Medicaid Other | Admitting: Nurse Practitioner

## 2013-10-28 ENCOUNTER — Other Ambulatory Visit: Payer: Self-pay | Admitting: Neurology

## 2013-10-30 ENCOUNTER — Other Ambulatory Visit: Payer: Self-pay | Admitting: Neurology

## 2013-10-30 NOTE — Telephone Encounter (Signed)
Patient has cancelled last 3 appts.  She has been advised she needs to get this med from PCP, as we filled it for her temporarily.

## 2014-07-30 DIAGNOSIS — F32A Anxiety disorder, unspecified: Secondary | ICD-10-CM | POA: Insufficient documentation

## 2014-07-30 DIAGNOSIS — M62838 Other muscle spasm: Secondary | ICD-10-CM | POA: Insufficient documentation

## 2014-07-30 DIAGNOSIS — F419 Anxiety disorder, unspecified: Secondary | ICD-10-CM | POA: Insufficient documentation

## 2014-11-03 ENCOUNTER — Encounter (HOSPITAL_COMMUNITY): Payer: Self-pay

## 2014-11-03 ENCOUNTER — Observation Stay (HOSPITAL_COMMUNITY): Payer: Managed Care, Other (non HMO)

## 2014-11-03 ENCOUNTER — Emergency Department (HOSPITAL_COMMUNITY): Payer: Managed Care, Other (non HMO)

## 2014-11-03 ENCOUNTER — Observation Stay (HOSPITAL_COMMUNITY)
Admission: EM | Admit: 2014-11-03 | Discharge: 2014-11-04 | Disposition: A | Discharge status: Home / Self Care | Payer: Managed Care, Other (non HMO) | Attending: Internal Medicine | Admitting: Internal Medicine

## 2014-11-03 DIAGNOSIS — N390 Urinary tract infection, site not specified: Secondary | ICD-10-CM | POA: Diagnosis not present

## 2014-11-03 DIAGNOSIS — G8110 Spastic hemiplegia affecting unspecified side: Secondary | ICD-10-CM | POA: Diagnosis present

## 2014-11-03 DIAGNOSIS — Z8673 Personal history of transient ischemic attack (TIA), and cerebral infarction without residual deficits: Secondary | ICD-10-CM | POA: Insufficient documentation

## 2014-11-03 DIAGNOSIS — M79602 Pain in left arm: Secondary | ICD-10-CM | POA: Diagnosis not present

## 2014-11-03 DIAGNOSIS — G8194 Hemiplegia, unspecified affecting left nondominant side: Secondary | ICD-10-CM

## 2014-11-03 DIAGNOSIS — Z79899 Other long term (current) drug therapy: Secondary | ICD-10-CM | POA: Diagnosis not present

## 2014-11-03 DIAGNOSIS — R0789 Other chest pain: Principal | ICD-10-CM | POA: Insufficient documentation

## 2014-11-03 DIAGNOSIS — I639 Cerebral infarction, unspecified: Secondary | ICD-10-CM

## 2014-11-03 DIAGNOSIS — R51 Headache: Secondary | ICD-10-CM | POA: Insufficient documentation

## 2014-11-03 DIAGNOSIS — R202 Paresthesia of skin: Secondary | ICD-10-CM | POA: Diagnosis not present

## 2014-11-03 DIAGNOSIS — M797 Fibromyalgia: Secondary | ICD-10-CM | POA: Diagnosis not present

## 2014-11-03 DIAGNOSIS — E780 Pure hypercholesterolemia: Secondary | ICD-10-CM | POA: Diagnosis not present

## 2014-11-03 DIAGNOSIS — I10 Essential (primary) hypertension: Secondary | ICD-10-CM | POA: Diagnosis not present

## 2014-11-03 DIAGNOSIS — R079 Chest pain, unspecified: Secondary | ICD-10-CM | POA: Diagnosis present

## 2014-11-03 DIAGNOSIS — G8190 Hemiplegia, unspecified affecting unspecified side: Secondary | ICD-10-CM | POA: Diagnosis not present

## 2014-11-03 DIAGNOSIS — Z720 Tobacco use: Secondary | ICD-10-CM | POA: Diagnosis not present

## 2014-11-03 LAB — RAPID URINE DRUG SCREEN, HOSP PERFORMED
Amphetamines: NOT DETECTED
Barbiturates: NOT DETECTED
Benzodiazepines: NOT DETECTED
Cocaine: NOT DETECTED
Opiates: NOT DETECTED
Tetrahydrocannabinol: NOT DETECTED

## 2014-11-03 LAB — DIFFERENTIAL
Basophils Absolute: 0.1 10*3/uL (ref 0.0–0.1)
Basophils Relative: 1 % (ref 0–1)
Eosinophils Absolute: 0.1 10*3/uL (ref 0.0–0.7)
Eosinophils Relative: 2 % (ref 0–5)
Lymphocytes Relative: 33 % (ref 12–46)
Lymphs Abs: 2.3 10*3/uL (ref 0.7–4.0)
Monocytes Absolute: 0.5 10*3/uL (ref 0.1–1.0)
Monocytes Relative: 7 % (ref 3–12)
Neutro Abs: 4 10*3/uL (ref 1.7–7.7)
Neutrophils Relative %: 57 % (ref 43–77)

## 2014-11-03 LAB — URINALYSIS, ROUTINE W REFLEX MICROSCOPIC
Bilirubin Urine: NEGATIVE
Glucose, UA: NEGATIVE mg/dL
Ketones, ur: 15 mg/dL — AB
Nitrite: POSITIVE — AB
Protein, ur: NEGATIVE mg/dL
Specific Gravity, Urine: 1.02 (ref 1.005–1.030)
Urobilinogen, UA: 0.2 mg/dL (ref 0.0–1.0)
pH: 7 (ref 5.0–8.0)

## 2014-11-03 LAB — COMPREHENSIVE METABOLIC PANEL
ALT: 14 U/L (ref 14–54)
AST: 20 U/L (ref 15–41)
Albumin: 4 g/dL (ref 3.5–5.0)
Alkaline Phosphatase: 75 U/L (ref 38–126)
Anion gap: 11 (ref 5–15)
BUN: 21 mg/dL — ABNORMAL HIGH (ref 6–20)
CO2: 25 mmol/L (ref 22–32)
Calcium: 9.5 mg/dL (ref 8.9–10.3)
Chloride: 104 mmol/L (ref 101–111)
Creatinine, Ser: 0.68 mg/dL (ref 0.44–1.00)
GFR calc Af Amer: >60 mL/min (ref >60)
GFR calc non Af Amer: >60 mL/min (ref >60)
Glucose, Bld: 110 mg/dL — ABNORMAL HIGH (ref 65–99)
Potassium: 3.8 mmol/L (ref 3.5–5.1)
Sodium: 140 mmol/L (ref 135–145)
Total Bilirubin: 0.6 mg/dL (ref 0.3–1.2)
Total Protein: 6.6 g/dL (ref 6.5–8.1)

## 2014-11-03 LAB — CREATININE, SERUM
Creatinine, Ser: 0.71 mg/dL (ref 0.44–1.00)
GFR calc Af Amer: >60 mL/min (ref >60)
GFR calc non Af Amer: >60 mL/min (ref >60)

## 2014-11-03 LAB — I-STAT CHEM 8, ED
BUN: 27 mg/dL — ABNORMAL HIGH (ref 6–20)
Calcium, Ion: 1.12 mmol/L (ref 1.12–1.23)
Chloride: 104 mmol/L (ref 101–111)
Creatinine, Ser: 0.6 mg/dL (ref 0.44–1.00)
Glucose, Bld: 106 mg/dL — ABNORMAL HIGH (ref 65–99)
HCT: 44 % (ref 36.0–46.0)
Hemoglobin: 15 g/dL (ref 12.0–15.0)
Potassium: 3.7 mmol/L (ref 3.5–5.1)
Sodium: 138 mmol/L (ref 135–145)
TCO2: 24 mmol/L (ref 0–100)

## 2014-11-03 LAB — URINE MICROSCOPIC-ADD ON

## 2014-11-03 LAB — TROPONIN I: Troponin I: <0.03 ng/mL (ref <0.031)

## 2014-11-03 LAB — CBC
HCT: 41.4 % (ref 36.0–46.0)
HCT: 41.9 % (ref 36.0–46.0)
Hemoglobin: 14.3 g/dL (ref 12.0–15.0)
Hemoglobin: 14.1 g/dL (ref 12.0–15.0)
MCH: 32.4 pg (ref 26.0–34.0)
MCH: 31.8 pg (ref 26.0–34.0)
MCHC: 34.5 g/dL (ref 30.0–36.0)
MCHC: 33.7 g/dL (ref 30.0–36.0)
MCV: 93.9 fL (ref 78.0–100.0)
MCV: 94.4 fL (ref 78.0–100.0)
Platelets: 212 10*3/uL (ref 150–400)
Platelets: 213 10*3/uL (ref 150–400)
RBC: 4.41 MIL/uL (ref 3.87–5.11)
RBC: 4.44 MIL/uL (ref 3.87–5.11)
RDW: 13.3 % (ref 11.5–15.5)
RDW: 13.2 % (ref 11.5–15.5)
WBC: 7 10*3/uL (ref 4.0–10.5)
WBC: 6.8 10*3/uL (ref 4.0–10.5)

## 2014-11-03 LAB — LIPID PANEL
Cholesterol: 262 mg/dL — ABNORMAL HIGH (ref 0–200)
HDL: 82 mg/dL (ref >40)
LDL Cholesterol: 155 mg/dL — ABNORMAL HIGH (ref 0–99)
Total CHOL/HDL Ratio: 3.2 RATIO
Triglycerides: 126 mg/dL (ref <150)
VLDL: 25 mg/dL (ref 0–40)

## 2014-11-03 LAB — PROTIME-INR
INR: 1.02 (ref 0.00–1.49)
Prothrombin Time: 13.6 seconds (ref 11.6–15.2)

## 2014-11-03 LAB — APTT: aPTT: 26 seconds (ref 24–37)

## 2014-11-03 LAB — ETHANOL: Alcohol, Ethyl (B): <5 mg/dL (ref <5)

## 2014-11-03 LAB — I-STAT TROPONIN, ED: Troponin i, poc: 0 ng/mL (ref 0.00–0.08)

## 2014-11-03 MED ORDER — MORPHINE SULFATE (PF) 2 MG/ML IV SOLN
1.0000 mg | INTRAVENOUS | Status: DC | PRN
  Administered 2014-11-03: 1 mg via INTRAVENOUS
  Filled 2014-11-03: qty 1

## 2014-11-03 MED ORDER — ONDANSETRON HCL 4 MG/2ML IJ SOLN
4.0000 mg | Freq: Four times a day (QID) | INTRAMUSCULAR | Status: DC | PRN
Start: 2014-11-03 — End: 2014-11-04
  Administered 2014-11-04: 4 mg via INTRAVENOUS
  Filled 2014-11-03: qty 2

## 2014-11-03 MED ORDER — PANTOPRAZOLE SODIUM 40 MG PO TBEC
40.0000 mg | DELAYED_RELEASE_TABLET | Freq: Every day | ORAL | Status: DC
  Administered 2014-11-04: 40 mg via ORAL
  Filled 2014-11-03: qty 1

## 2014-11-03 MED ORDER — MORPHINE SULFATE (PF) 2 MG/ML IV SOLN
1.0000 mg | Freq: Once | INTRAVENOUS | Status: AC
  Administered 2014-11-03: 1 mg via INTRAVENOUS
  Filled 2014-11-03: qty 1

## 2014-11-03 MED ORDER — OXYCODONE HCL 5 MG PO TABS: 5.0000 mg | ORAL_TABLET | Freq: Four times a day (QID) | ORAL | Status: DC | PRN

## 2014-11-03 MED ORDER — ASPIRIN EC 81 MG PO TBEC
81.0000 mg | DELAYED_RELEASE_TABLET | Freq: Every day | ORAL | Status: DC
  Administered 2014-11-03 – 2014-11-04 (×2): 81 mg via ORAL
  Filled 2014-11-03 (×2): qty 1

## 2014-11-03 MED ORDER — METAXALONE 400 MG HALF TABLET
400.0000 mg | ORAL_TABLET | Freq: Two times a day (BID) | ORAL | Status: DC | PRN
  Filled 2014-11-03: qty 1

## 2014-11-03 MED ORDER — LABETALOL HCL 5 MG/ML IV SOLN
10.0000 mg | Freq: Once | INTRAVENOUS | Status: AC
  Administered 2014-11-03: 10 mg via INTRAVENOUS
  Filled 2014-11-03: qty 4

## 2014-11-03 MED ORDER — VENLAFAXINE HCL 75 MG PO TABS
75.0000 mg | ORAL_TABLET | Freq: Two times a day (BID) | ORAL | Status: DC
  Administered 2014-11-04: 75 mg via ORAL
  Filled 2014-11-03 (×4): qty 1

## 2014-11-03 MED ORDER — SODIUM CHLORIDE 0.9 % IJ SOLN
3.0000 mL | Freq: Two times a day (BID) | INTRAMUSCULAR | Status: DC
  Administered 2014-11-03 – 2014-11-04 (×2): 3 mL via INTRAVENOUS

## 2014-11-03 MED ORDER — LABETALOL HCL 5 MG/ML IV SOLN: 10.0000 mg | INTRAVENOUS | Status: DC | PRN

## 2014-11-03 MED ORDER — LISINOPRIL 20 MG PO TABS
20.0000 mg | ORAL_TABLET | Freq: Every day | ORAL | Status: DC
  Administered 2014-11-04: 20 mg via ORAL
  Filled 2014-11-03: qty 1

## 2014-11-03 MED ORDER — LORAZEPAM 2 MG/ML IJ SOLN: 0.5000 mg | Freq: Once | INTRAMUSCULAR | Status: DC

## 2014-11-03 MED ORDER — LABETALOL HCL 5 MG/ML IV SOLN
10.0000 mg | INTRAVENOUS | Status: DC | PRN
  Filled 2014-11-03: qty 4

## 2014-11-03 MED ORDER — VENLAFAXINE HCL 75 MG PO TABS
75.0000 mg | ORAL_TABLET | Freq: Two times a day (BID) | ORAL | Status: DC
  Filled 2014-11-03: qty 1

## 2014-11-03 MED ORDER — GABAPENTIN 100 MG PO CAPS
200.0000 mg | ORAL_CAPSULE | Freq: Three times a day (TID) | ORAL | Status: DC
  Filled 2014-11-03: qty 2

## 2014-11-03 MED ORDER — DEXTROSE 5 % IV SOLN
1.0000 g | INTRAVENOUS | Status: DC
  Administered 2014-11-03: 1 g via INTRAVENOUS
  Filled 2014-11-03 (×2): qty 10

## 2014-11-03 MED ORDER — ENOXAPARIN SODIUM 40 MG/0.4ML ~~LOC~~ SOLN
40.0000 mg | SUBCUTANEOUS | Status: DC
Start: 2014-11-03 — End: 2014-11-04
  Administered 2014-11-03: 40 mg via SUBCUTANEOUS
  Filled 2014-11-03: qty 0.4

## 2014-11-03 MED ORDER — ACETAMINOPHEN 325 MG PO TABS: 650.0000 mg | ORAL_TABLET | ORAL | Status: DC | PRN

## 2014-11-03 MED ORDER — AMLODIPINE BESYLATE 10 MG PO TABS
10.0000 mg | ORAL_TABLET | Freq: Every day | ORAL | Status: DC
  Administered 2014-11-04: 10 mg via ORAL
  Filled 2014-11-03: qty 1

## 2014-11-03 NOTE — Consult Note (Addendum)
Reason for Consult:Dr Jeneen Rinks Referring Physician: Code Stroke HPI:   Carla Bowen is an 59 y.o. female. He states that at 8:30 today she notice pressure in her chest as well as left shoulder and she noticed tightness on the left upper and lower extremity. 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 She feels subjectively her left-sided deficits have gotten worse with feeling of tightness as well as increased tingling on the left side. She denies any significant increase weakness. She was brought in as a code stroke and was evaluated by me upon arrival. CT scan of the head personally reviewed by me showed no acute abnormality. Old right thalamic infarct was noted a. The patient denied significant headache but had elevated blood pressure greater than 619 systolic upon arrival. She denies any recent illness, fever, bladder infection. She admits she has not been drinking enough fluids. She has not been taking aspirin everyday but takes ibuprofen as needed for pain. 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 She had a prolonged stay in rehabilitation facility for several months and she saw me in the office in on 05/30/13 and 07/02/13. 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 Hypertension   . High cholesterol   . Fibromyalgia   . Stroke     Past Surgical History  Procedure Laterality Date  . Cesarean section      Family History  Problem Relation Age of Onset  . Diabetes Brother   . Heart attack Brother   . Hypertension Brother   . Hypertension Sister   . Lung cancer Father      Social History:  reports that she has been smoking.  She does not have any smokeless tobacco history on file. She reports that she drinks alcohol. She reports that she does not use illicit drugs.  Allergies:  Allergies  Allergen Reactions  . Codeine Hives  . Hydrocodone Hives  . Oxycodone Hives  . Sulfa Antibiotics Hives    Medications: Prior to Admission: Patient unable to name at present (Not in a hospital admission)  Results for orders placed or performed during the hospital encounter of 11/03/14 (from the past 48 hour(s))  Ethanol     Status: None   Collection Time: 11/03/14  1:45 PM  Result Value Ref Range   Alcohol, Ethyl (B) <5 <5 mg/dL    Comment:        LOWEST DETECTABLE LIMIT FOR SERUM ALCOHOL IS 5 mg/dL FOR MEDICAL PURPOSES ONLY   Protime-INR     Status: None   Collection Time: 11/03/14  1:50 PM  Result Value Ref Range   Prothrombin Time 13.6 11.6 - 15.2 seconds   INR 1.02 0.00 - 1.49  APTT     Status: None   Collection Time: 11/03/14  1:50 PM  Result Value Ref Range   aPTT 26 24 - 37 seconds  CBC     Status: None   Collection Time: 11/03/14  1:50 PM  Result Value Ref Range   WBC 7.0 4.0 - 10.5 K/uL   RBC 4.41 3.87 - 5.11 MIL/uL   Hemoglobin 14.3 12.0 - 15.0 g/dL  HCT 41.4 36.0 - 46.0 %   MCV 93.9 78.0 - 100.0 fL   MCH 32.4 26.0 - 34.0 pg   MCHC 34.5 30.0 - 36.0 g/dL   RDW 13.3 11.5 - 15.5 %   Platelets 212 150 - 400 K/uL  Differential     Status: None   Collection Time: 11/03/14  1:50 PM  Result Value Ref Range   Neutrophils Relative % 57 43 - 77 %   Neutro Abs 4.0 1.7 - 7.7 K/uL   Lymphocytes Relative 33 12 - 46 %   Lymphs Abs 2.3 0.7 - 4.0 K/uL   Monocytes Relative 7 3 - 12 %   Monocytes Absolute 0.5 0.1 - 1.0 K/uL   Eosinophils Relative 2 0 - 5 %   Eosinophils Absolute 0.1 0.0 - 0.7 K/uL   Basophils Relative 1 0 - 1 %   Basophils Absolute 0.1 0.0 - 0.1 K/uL  Comprehensive metabolic panel     Status: Abnormal   Collection Time: 11/03/14   1:50 PM  Result Value Ref Range   Sodium 140 135 - 145 mmol/L   Potassium 3.8 3.5 - 5.1 mmol/L   Chloride 104 101 - 111 mmol/L   CO2 25 22 - 32 mmol/L   Glucose, Bld 110 (H) 65 - 99 mg/dL   BUN 21 (H) 6 - 20 mg/dL   Creatinine, Ser 0.68 0.44 - 1.00 mg/dL   Calcium 9.5 8.9 - 10.3 mg/dL   Total Protein 6.6 6.5 - 8.1 g/dL   Albumin 4.0 3.5 - 5.0 g/dL   AST 20 15 - 41 U/L   ALT 14 14 - 54 U/L   Alkaline Phosphatase 75 38 - 126 U/L   Total Bilirubin 0.6 0.3 - 1.2 mg/dL   GFR calc non Af Amer >60 >60 mL/min   GFR calc Af Amer >60 >60 mL/min    Comment: (NOTE) The eGFR has been calculated using the CKD EPI equation. This calculation has not been validated in all clinical situations. eGFR's persistently <60 mL/min signify possible Chronic Kidney Disease.    Anion gap 11 5 - 15  I-stat troponin, ED (not at [facility], [facility])     Status: None   Collection Time: 11/03/14  1:52 PM  Result Value Ref Range   Troponin i, poc 0.00 0.00 - 0.08 ng/mL   Comment 3            Comment: Due to the release kinetics of cTnI, a negative result within the first hours of the onset of symptoms does not rule out myocardial infarction with certainty. If myocardial infarction is still suspected, repeat the test at appropriate intervals.   I-Stat Chem 8, ED  (not at [facility], [facility])     Status: Abnormal   Collection Time: 11/03/14  1:54 PM  Result Value Ref Range   Sodium 138 135 - 145 mmol/L   Potassium 3.7 3.5 - 5.1 mmol/L   Chloride 104 101 - 111 mmol/L   BUN 27 (H) 6 - 20 mg/dL   Creatinine, Ser 0.60 0.44 - 1.00 mg/dL   Glucose, Bld 106 (H) 65 - 99 mg/dL   Calcium, Ion 1.12 1.12 - 1.23 mmol/L   TCO2 24 0 - 100 mmol/L   Hemoglobin 15.0 12.0 - 15.0 g/dL   HCT 44.0 36.0 - 46.0 %    Ct Head Wo Contrast  11/03/2014   CLINICAL DATA:  59 year old with code stroke.  Left-sided weakness.  EXAM: CT HEAD WITHOUT CONTRAST  TECHNIQUE: Contiguous  axial images were obtained from the base of the skull through the vertex  without contrast.  COMPARISON:  None  FINDINGS: There is low-density in the right thalamus that extends into the posterior right white matter tract. Suspect this represents an old infarct. There is no evidence for acute hemorrhage, mass lesion, midline shift or hydrocephalus. Mastoid air cells are clear. There is mild mucosal thickening in the sphenoid sinuses. No acute bone abnormality. There are high-density areas along the left frontal lobe on images 15 and 16 which likely represent bone artifact.  IMPRESSION: Negative for acute hemorrhage.  Suspect old ischemic changes involving the right thalamus and right white matter tract.  High-density areas along the left frontal bone probably represent artifact and do not correspond with patient's clinical symptoms.  These results were called by telephone at the time of interpretation on 11/03/2014 at 2:00 pm to Dr. Leonie Bowen, who verbally acknowledged these results.   Electronically Signed   By: Markus Daft M.D.   On: 11/03/2014 14:04      ROS as documented in history of present illness Blood pressure 179/94, pulse 73, temperature 98 F (36.7 C), temperature source Oral, resp. rate 21, height 5' 4" (1.626 m), weight 110 lb 0.2 oz (49.9 kg), SpO2 98 %. Physical Exam  . Frail middle-aged Caucasian lady not in distress Afebrile. Head is nontraumatic. Neck is supple without bruit.    Cardiac exam no murmur or gallop. Lungs are clear to auscultation. Distal pulses are well felt. Neurological Exam :  Awake alert oriented 3 normal speech and language function. Fundi were not visualized. Vision acuity seems adequate. Extraocular movements are pharyngeal or nystagmus. She brings to threat bilaterally. No visual field deficit. Mild left lower facial asymmetry when she smiles. Tongue midline. Motor system exam mild left upper and lower extremity drift with tremulousness. Mild weakness of left grip and intrinsic hand muscles. Mild weakness of left hip flexors and ankle  dorsiflexors. Tone is mildly increased on the left side. Mild left finger-to-nose and knee to dysmetria. Left hemibody is diminished touch pinprick sensation. Gait was not tested.  NIH stroke scale 7 ( unclear what is new and old )  Assessment/Plan: 73 year Caucasian lady with subjective left chest and shoulder pain, tightness with subjective worsening of existing left-sided sensory and motor deficits of unclear etiology. Doubt this represents a new large right brain stroke. Patient has presented outside time window for TPA and neurological deficits do not merit evaluation and treatment for large vessel occlusion. Suspect this is transient worsening of her deficits in the setting of an acute medical illness. Plan admit to medical service for evaluation for chest pain. Check EKG, cardiac enzymes. Check MRI scan of the brain later. Physical occupational speech therapy consults. Start aspirin 81 mg daily for stroke prevention and aggressive blood pressure control after first 24-48 hours with goal below 130/90 and lipids with LDL cholesterol goal below 70 mg percent. Check lipid profile, hemoglobin A1c, carotid Dopplers and 2-D echo. Physical and occupational therapy consults. Stroke team will continue to follow. Currently call for questions. Discussed with patient and ER MD GREATER than 50% of time during this 45 minute visit was spent on counseling and coordination of care. Ashrith Bowen 11/03/2014, 3:17 PM    Note: This document was prepared with digital dictation and possible smart phrase technology. Any transcriptional errors that result from this process are unintentional.

## 2014-11-03 NOTE — Code Documentation (Signed)
59yo female arriving to [facility] via [facility] at 1338.  EMS reports the patient has a h/o stroke with left sided residual deficits and reports that she had worsening left sided numbness and tingling along with left arm pain at 0830.  EMS called and activated Code Stroke.  Stroke team at the bedside on arrival.  Labs drawn and patient cleared by Dr. Fayrene Fearing.  Patient to CT with team.  NIHSS 7, see documentation for details and code stroke times.  Exam consistent with patient's baseline except patient reporting an increase in left sided numbness and tingling.  Patient is outside the window to treat with tPA.  No acute stroke treatment at this time.  Bedside handoff with ED RN Denny Peon.

## 2014-11-03 NOTE — ED Notes (Signed)
Pt reports she woke up this morning with left arm pain, tingling and numbness.  Pt had a previous CVA in 2013 and has left sided deficits.  Pt reports the deficits are increased today.

## 2014-11-03 NOTE — H&P (Addendum)
[facility] History and Physical  Carla Bowen:[id number] DOB: 02-13-1956 DOA: 11/03/2014  Referring physician:  PCP: No primary care provider on file.   Chief Complaint: Left arm pain  HPI: Carla Bowen is a 59 y.o. female with a past medical history of CVA in 2014 having left-sided residual hemiparesis and left-sided spasticity, hypertension, presented to the emergency department with complaints of left upper extremity pain. She reports having a history of left upper extremity pain that she reports is constant for the most part having 3-4/10 intensity pain, however, this morning she woke up having severe arm pain with intensity of 10/10, associated with tingling, burning, and worsening weakness. She presented as a code CVA and was evaluated by neurology in the emergency department. A stat CT scan of the brain did not reveal acute intracranial abnormality. Old right thalamic infarct appears stable. During my encounter she complains of ongoing left upper extremity pain that radiates to her left thoracic region. She denies history of exertional chest pain or shortness of breath. She also denies palpitations, fevers, chills, nausea, vomiting, abdominal pain, syncope, presyncope.  Her troponin was negative on presentation. EKG did not show acute ischemic changes.                                   Review of Systems:  Constitutional:  No weight loss, night sweats, Fevers, chills, fatigue.  HEENT:  No headaches, Difficulty swallowing,Tooth/dental problems,Sore throat,  No sneezing, itching, ear ache, nasal congestion, post nasal drip,  Cardio-vascular:  No chest pain, Orthopnea, PND, swelling in lower extremities, anasarca, dizziness, palpitations  GI:  No heartburn, indigestion, abdominal pain, nausea, vomiting, diarrhea, change in bowel habits, loss of appetite  Resp:  No shortness of breath with exertion or at rest. No excess mucus, no productive cough, No non-productive cough,  No coughing up of blood.No change in color of mucus.No wheezing.No chest wall deformity  Skin:  no rash or lesions.  GU:  no dysuria, change in color of urine, no urgency or frequency. No flank pain.  Musculoskeletal:  Positive for severe left upper extremity arm pain and right-sided thoracic wall pain Psych:  No change in mood or affect. No depression or anxiety. No memory loss.   Past Medical History  Diagnosis Date  . Hypertension   . High cholesterol   . Fibromyalgia   . Stroke    Past Surgical History  Procedure Laterality Date  . Cesarean section     Social History:  reports that she has been smoking.  She does not have any smokeless tobacco history on file. She reports that she drinks alcohol. She reports that she does not use illicit drugs.  Allergies  Allergen Reactions  . Codeine Hives  . Sulfa Antibiotics Hives    Family History  Problem Relation Age of Onset  . Diabetes Brother   . Heart attack Brother   . Hypertension Brother   . Hypertension Sister   . Lung cancer Father     Prior to Admission medications   Medication Sig Start Date End Date Taking? Authorizing Provider  amLODipine (NORVASC) 10 MG tablet Take 1 tablet (10 mg total) by mouth daily. 06/25/13  Yes Micki Riley, MD  ibuprofen (ADVIL,MOTRIN) 200 MG tablet Take 400 mg by mouth every 6 (six) hours as needed (pain).   Yes Historical Provider, MD  lisinopril (PRINIVIL,ZESTRIL) 20 MG tablet Take 1 tablet (20 mg  total) by mouth daily. 09/23/13  Yes Micki Riley, MD  venlafaxine (EFFEXOR) 75 MG tablet TAKE 1 TABLET (75 MG TOTAL) BY MOUTH DAILY.   Yes Micki Riley, MD  gabapentin (NEURONTIN) 100 MG capsule Take 200 mg by mouth 3 (three) times daily. 2 tablets bid 07/30/13   Levert Feinstein, MD  lansoprazole (PREVACID) 30 MG capsule Take 1 capsule (30 mg total) by mouth daily at 12 noon. Patient not taking: Reported on 11/03/2014 08/08/13   Benjiman Core, MD  promethazine (PHENERGAN) 25 MG tablet Take 1  tablet (25 mg total) by mouth every 6 (six) hours as needed for nausea. Patient not taking: Reported on 11/03/2014 08/08/13   Benjiman Core, MD  traMADol (ULTRAM) 50 MG tablet Take 50 mg by mouth every 6 (six) hours as needed (pain). 05/30/13   Micki Riley, MD   Physical Exam: Filed Vitals:   11/03/14 1515 11/03/14 1530 11/03/14 1545 11/03/14 1700  BP: 185/90 158/74 171/94 203/78  Pulse: 76 86 72 77  Temp:      TempSrc:      Resp: Height:      Weight:      SpO2: 98% 98% 96% 99%    Wt Readings from Last 3 Encounters:  11/03/14 49.9 kg (110 lb 0.2 oz)  07/02/13 49.896 kg (110 lb)  05/30/13 49.442 kg (109 lb)    General:  Appears in mild distress, reports severe arm pain. She is awake and alert, and can provide her own history. Eyes: PERRL, normal lids, irises & conjunctiva ENT: grossly normal hearing, lips & tongue Neck: no LAD, masses or thyromegaly Cardiovascular: Tachycardic, RRR, no m/r/g. No LE edema. Telemetry: SR, no arrhythmias  Respiratory: CTA bilaterally, no w/r/r. Normal respiratory effort. Abdomen: soft, ntnd Skin: no rash or induration seen on limited exam Musculoskeletal: Patient having spasticity of her left upper extremity, with severe pain with active movement across her entire arm. There was no tenderness to palpation across her thoracic wall region Psychiatric: grossly normal mood and affect, speech fluent and appropriate Neurologic: She is awake and alert oriented 3. Pupils are equal round reactive to light extraocular movement intact. There is no tongue deviation. There was mild left sided facial droop. Neck supple. Significant spasticity of left upper extremity, could not evaluate strength. Father 5 muscle strength to right upper extremity. Had a final strength to left lower extremity and 5, strength to right lower extremity. She has diminished sensation to left side.           Labs on Admission:  Basic Metabolic Panel:  Recent Labs Lab  11/03/14 1350 11/03/14 1354  NA 140 138  K 3.8 3.7  CL 104 104  CO2 25  --   GLUCOSE 110* 106*  BUN 21* 27*  CREATININE 0.68 0.60  CALCIUM 9.5  --    Liver Function Tests:  Recent Labs Lab 11/03/14 1350  AST 20  ALT 14  ALKPHOS 75  BILITOT 0.6  PROT 6.6  ALBUMIN 4.0   No results for input(s): LIPASE, AMYLASE in the last 168 hours. No results for input(s): AMMONIA in the last 168 hours. CBC:  Recent Labs Lab 11/03/14 1350 11/03/14 1354  WBC 7.0  --   NEUTROABS 4.0  --   HGB 14.3 15.0  HCT 41.4 44.0  MCV 93.9  --   PLT 212  --    Cardiac Enzymes: No results for input(s): CKTOTAL, CKMB, CKMBINDEX, TROPONINI in the last 168  hours.  BNP (last 3 results) No results for input(s): BNP in the last 8760 hours.  ProBNP (last 3 results) No results for input(s): PROBNP in the last 8760 hours.  CBG: No results for input(s): GLUCAP in the last 168 hours.  Radiological Exams on Admission: Ct Head Wo Contrast  11/03/2014   CLINICAL DATA:  59 year old with code stroke.  Left-sided weakness.  EXAM: CT HEAD WITHOUT CONTRAST  TECHNIQUE: Contiguous axial images were obtained from the base of the skull through the vertex without contrast.  COMPARISON:  None  FINDINGS: There is low-density in the right thalamus that extends into the posterior right white matter tract. Suspect this represents an old infarct. There is no evidence for acute hemorrhage, mass lesion, midline shift or hydrocephalus. Mastoid air cells are clear. There is mild mucosal thickening in the sphenoid sinuses. No acute bone abnormality. There are high-density areas along the left frontal lobe on images 15 and 16 which likely represent bone artifact.  IMPRESSION: Negative for acute hemorrhage.  Suspect old ischemic changes involving the right thalamus and right white matter tract.  High-density areas along the left frontal bone probably represent artifact and do not correspond with patient's clinical symptoms.  These  results were called by telephone at the time of interpretation on 11/03/2014 at 2:00 pm to Dr. Pearlean Brownie, who verbally acknowledged these results.   Electronically Signed   By: Richarda Overlie M.D.   On: 11/03/2014 14:04    EKG: Independently reviewed. Sinus rhythm  Assessment/Plan Principal Problem:   Chest pain Active Problems:   UTI (lower urinary tract infection)   HTN (hypertension)   Spastic hemiplegia affecting nondominant side   1. Chest pain. Patient presenting with complaints of arm and chest pain having atypical features. I suspect her left-sided chest pain may be radiating from severe arm pain as a result of spasticity. EKG did not reveal acute ischemic changes, troponin was negative. We'll cycle troponins 3 sets overnight, place her on continuous cardiac monitoring, start 81 mg of aspirin daily, check a transthoracic echocardiogram in a.m. 2. Left-sided spasticity with history of CVA. Will continue gabapentin 200 mg by mouth 3 times a day. Add Skelaxin 400 mg by mouth twice a day as needed for muscle spasms. She reports taking Flexeril the past stating this did not help. Will have narcotic analgesics available for severe breakthrough pain. Physical therapy consult.  3. History of CVA. Patient diagnosed with right thalamic intracerebral hemorrhage in 2014. She initially presented as a code CVA given reports of worsening left-sided weakness. Left arm pain seems to be her major complaint. She was seen and evaluated by neurology who recommended starting aspirin 81 mg by mouth daily for primary prevention as she had not been on antiplatelets therapy prior to this presentation. Pending MRI of brain. Await further recommendations from neurology. 4. Hypertension. Patient having elevated blood pressures in the emergency department. Will provide as needed Labetolol 10 mg every 2 hours for systolic blood pressures greater than 170. Will continue home regimen with Norvasc 10 mg by mouth daily and lisinopril  20 mg by mouth daily. 5. Urinary tract infection. Urinalysis showed the presence of many bacteria and nitrates. Will treat with ceftriaxone 1 g IV every 24 hours 6. Gastroesophageal reflux disease. PPI 7. DVT prophylaxis. Lovenox   Code Status: Full code DVT Prophylaxis: Lovenox Family Communication: Spoke with family members present at bedside Disposition Plan: Will admit patient to the inpatient service, anticipate may need greater than 2 nights hospitalization  Time spent: 70 min  Kelvin Cellar [facility] Pager [phone]

## 2014-11-03 NOTE — ED Notes (Signed)
Vanessa Barbara MD made aware of pt blood pressure.  Further orders received.

## 2014-11-03 NOTE — ED Provider Notes (Signed)
CSN: [id number]     Arrival date & time 11/03/14  1338 History   First MD Initiated Contact with Patient 11/03/14 1340     Chief Complaint  Patient presents with  . Code Stroke     (Consider location/radiation/quality/duration/timing/severity/associated sxs/prior Treatment) The history is provided by the patient, the EMS personnel and medical records. No language interpreter was used.   patient is a 59 year old female with past medical history of spastic hemiplegia affecting her left side secondary to stroke who presents today with left-sided chest and left-sided arm tightness and pain. Patient relates she woke up with this pain this morning. She relates she's had shortness of breath with this as well because of the tight sensation in her chest and arm. She denies any previous symptoms like this in the past. She also relates she's had burning sensation in her left hand as well. She denies any worsening weakness in her left arm today. She denies any recent fevers or chills. She does relay she's had dysuria and is currently on an antibiotic that she is unsure the name of. She denies any nausea, vomiting, diarrhea. She denies any increased pain or swelling in her lower extremities bilaterally. She denies any worsening decreased sensation as well. She denies any changes in her medications recently. Patient to come in as a code stroke and was immediately taken to the CT scanner. Neurology relates that do not see any significant changes on CT scan today.  Past Medical History  Diagnosis Date  . Hypertension   . High cholesterol   . Fibromyalgia   . Stroke    Past Surgical History  Procedure Laterality Date  . Cesarean section     Family History  Problem Relation Age of Onset  . Diabetes Brother   . Heart attack Brother   . Hypertension Brother   . Hypertension Sister   . Lung cancer Father    Social History  Substance Use Topics  . Smoking status: Current Every Day Smoker  . Smokeless  tobacco: None  . Alcohol Use: Yes   OB History    No data available     Review of Systems  Constitutional: Negative for fever and chills.  HENT: Negative for congestion and rhinorrhea.   Eyes: Negative for photophobia and visual disturbance.  Respiratory: Positive for chest tightness (L chest radiating into L arm). Negative for shortness of breath, wheezing and stridor.   Cardiovascular: Positive for chest pain. Negative for palpitations.  Gastrointestinal: Negative for nausea, vomiting and abdominal pain.  Endocrine: Negative for polyphagia and polyuria.  Genitourinary: Positive for dysuria (states currently on ABX for this).  Musculoskeletal: Positive for gait problem (at baseline 2/2 prior stroke and L side leg weakness). Negative for arthralgias.  Skin: Negative for pallor and rash.  Neurological: Positive for weakness (LUE and LLE at baseline, not worsened). Negative for dizziness, facial asymmetry, light-headedness, numbness and headaches.  Psychiatric/Behavioral: Negative for confusion and agitation.  All other systems reviewed and are negative.     Allergies  Codeine and Sulfa antibiotics  Home Medications   Prior to Admission medications   Medication Sig Start Date End Date Taking? Authorizing Provider  amLODipine (NORVASC) 10 MG tablet Take 1 tablet (10 mg total) by mouth daily. 06/25/13  Yes Micki Riley, MD  ibuprofen (ADVIL,MOTRIN) 200 MG tablet Take 400 mg by mouth every 6 (six) hours as needed (pain).   Yes Historical Provider, MD  lisinopril (PRINIVIL,ZESTRIL) 20 MG tablet Take 1 tablet (20 mg total)  by mouth daily. 09/23/13  Yes Micki Riley, MD  venlafaxine (EFFEXOR) 75 MG tablet TAKE 1 TABLET (75 MG TOTAL) BY MOUTH DAILY.   Yes Micki Riley, MD  gabapentin (NEURONTIN) 100 MG capsule Take 200 mg by mouth 3 (three) times daily. 2 tablets bid 07/30/13   Levert Feinstein, MD  lansoprazole (PREVACID) 30 MG capsule Take 1 capsule (30 mg total) by mouth daily at 12  noon. Patient not taking: Reported on 11/03/2014 08/08/13   Benjiman Core, MD  promethazine (PHENERGAN) 25 MG tablet Take 1 tablet (25 mg total) by mouth every 6 (six) hours as needed for nausea. Patient not taking: Reported on 11/03/2014 08/08/13   Benjiman Core, MD  traMADol (ULTRAM) 50 MG tablet Take 50 mg by mouth every 6 (six) hours as needed (pain). 05/30/13   Micki Riley, MD   BP 179/94 mmHg  Pulse 73  Temp(Src) 98 F (36.7 C) (Oral)  Resp 21  Ht 5\' 4"  (1.626 m)  Wt 110 lb 0.2 oz (49.9 kg)  BMI 18.87 kg/m2  SpO2 98% Physical Exam  Constitutional: She is oriented to person, place, and time. No distress.  HENT:  Head: Normocephalic and atraumatic.  Eyes: Conjunctivae and EOM are normal.  Neck: Normal range of motion. Neck supple.  Cardiovascular: Normal rate, regular rhythm and normal heart sounds.   No murmur heard. Pulmonary/Chest: Effort normal and breath sounds normal.  Abdominal: Soft. Bowel sounds are normal. There is no tenderness. There is no rebound and no guarding.  Musculoskeletal: Normal range of motion. She exhibits no tenderness.  Neurological: She is alert and oriented to person, place, and time. No cranial nerve deficit (CN II-XII intact) or sensory deficit. GCS eye subscore is 4. GCS verbal subscore is 5. GCS motor subscore is 6.  4/5 strength in upper and lower L side extremity. 5/5 on RUE and RLE. Spastic muscles noted in L arm and L leg  Skin: Skin is warm and dry. She is not diaphoretic.  Psychiatric: She has a normal mood and affect. Her behavior is normal.  Nursing note and vitals reviewed.   ED Course  Procedures (including critical care time) Labs Review Labs Reviewed  COMPREHENSIVE METABOLIC PANEL - Abnormal; Notable for the following:    Glucose, Bld 110 (*)    BUN 21 (*)    All other components within normal limits  URINALYSIS, ROUTINE W REFLEX MICROSCOPIC (NOT AT [facility]) - Abnormal; Notable for the following:    APPearance TURBID (*)     Hgb urine dipstick SMALL (*)    Ketones, ur 15 (*)    Nitrite POSITIVE (*)    Leukocytes, UA TRACE (*)    All other components within normal limits  URINE MICROSCOPIC-ADD ON - Abnormal; Notable for the following:    Squamous Epithelial / LPF FEW (*)    Bacteria, UA MANY (*)    All other components within normal limits  I-STAT CHEM 8, ED - Abnormal; Notable for the following:    BUN 27 (*)    Glucose, Bld 106 (*)    All other components within normal limits  ETHANOL  PROTIME-INR  APTT  CBC  DIFFERENTIAL  URINE RAPID DRUG SCREEN, HOSP PERFORMED  LIPID PANEL  HEMOGLOBIN A1C  I-STAT TROPOININ, ED    Imaging Review Ct Head Wo Contrast  11/03/2014   CLINICAL DATA:  59 year old with code stroke.  Left-sided weakness.  EXAM: CT HEAD WITHOUT CONTRAST  TECHNIQUE: Contiguous axial images were obtained from the  base of the skull through the vertex without contrast.  COMPARISON:  None  FINDINGS: There is low-density in the right thalamus that extends into the posterior right white matter tract. Suspect this represents an old infarct. There is no evidence for acute hemorrhage, mass lesion, midline shift or hydrocephalus. Mastoid air cells are clear. There is mild mucosal thickening in the sphenoid sinuses. No acute bone abnormality. There are high-density areas along the left frontal lobe on images 15 and 16 which likely represent bone artifact.  IMPRESSION: Negative for acute hemorrhage.  Suspect old ischemic changes involving the right thalamus and right white matter tract.  High-density areas along the left frontal bone probably represent artifact and do not correspond with patient's clinical symptoms.  These results were called by telephone at the time of interpretation on 11/03/2014 at 2:00 pm to Dr. Pearlean Brownie, who verbally acknowledged these results.   Electronically Signed   By: Richarda Overlie M.D.   On: 11/03/2014 14:04   I have personally reviewed and evaluated these images and lab results as part of  my medical decision-making.   EKG Interpretation None      MDM   Final diagnoses:  Chest tightness  Left arm pain  History of stroke    patient is a 59 year old female with past medical history of spastic hemiplegia affecting her left side secondary to stroke who presents today with left-sided chest and left-sided arm tightness and pain. Findings today are more concerning for ACS given the history provided by the patient. Neurology relates that they do not appreciate any significant changes on noncontrast head CT today. Currently they do not recommend TPA for current presentation. Initial troponin is negative. No acute ischemic changes on EKG. Given patient's history of hypertension, high cholesterol, long history of smoking -- patient is at high risk for cardiac etiology of today's symptoms. Discussed with internal medicine who will admit the patient for further evaluation and management. Patient aware of the plan to admit to the hospital and is agreeable. Patient was stable the time of admission.     Madolyn Frieze, MD 11/03/14 1538  Rolland Porter, MD 11/03/14 [phone]

## 2014-11-04 ENCOUNTER — Observation Stay (HOSPITAL_BASED_OUTPATIENT_CLINIC_OR_DEPARTMENT_OTHER): Payer: Managed Care, Other (non HMO)

## 2014-11-04 ENCOUNTER — Observation Stay (HOSPITAL_COMMUNITY): Payer: Managed Care, Other (non HMO)

## 2014-11-04 ENCOUNTER — Observation Stay (HOSPITAL_COMMUNITY): Payer: Medicaid Other

## 2014-11-04 DIAGNOSIS — N390 Urinary tract infection, site not specified: Secondary | ICD-10-CM

## 2014-11-04 DIAGNOSIS — G8110 Spastic hemiplegia affecting unspecified side: Secondary | ICD-10-CM | POA: Diagnosis not present

## 2014-11-04 DIAGNOSIS — I639 Cerebral infarction, unspecified: Secondary | ICD-10-CM

## 2014-11-04 DIAGNOSIS — R0789 Other chest pain: Secondary | ICD-10-CM

## 2014-11-04 DIAGNOSIS — R079 Chest pain, unspecified: Secondary | ICD-10-CM

## 2014-11-04 DIAGNOSIS — G8190 Hemiplegia, unspecified affecting unspecified side: Secondary | ICD-10-CM

## 2014-11-04 DIAGNOSIS — I10 Essential (primary) hypertension: Secondary | ICD-10-CM

## 2014-11-04 DIAGNOSIS — Z8673 Personal history of transient ischemic attack (TIA), and cerebral infarction without residual deficits: Secondary | ICD-10-CM | POA: Diagnosis not present

## 2014-11-04 DIAGNOSIS — G8194 Hemiplegia, unspecified affecting left nondominant side: Secondary | ICD-10-CM | POA: Insufficient documentation

## 2014-11-04 LAB — BASIC METABOLIC PANEL
Anion gap: 9 (ref 5–15)
BUN: 15 mg/dL (ref 6–20)
CO2: 23 mmol/L (ref 22–32)
Calcium: 9.1 mg/dL (ref 8.9–10.3)
Chloride: 104 mmol/L (ref 101–111)
Creatinine, Ser: 0.58 mg/dL (ref 0.44–1.00)
GFR calc Af Amer: >60 mL/min (ref >60)
GFR calc non Af Amer: >60 mL/min (ref >60)
Glucose, Bld: 134 mg/dL — ABNORMAL HIGH (ref 65–99)
Potassium: 3.8 mmol/L (ref 3.5–5.1)
Sodium: 136 mmol/L (ref 135–145)

## 2014-11-04 LAB — CBC
HCT: 42.8 % (ref 36.0–46.0)
Hemoglobin: 14.3 g/dL (ref 12.0–15.0)
MCH: 31.6 pg (ref 26.0–34.0)
MCHC: 33.4 g/dL (ref 30.0–36.0)
MCV: 94.7 fL (ref 78.0–100.0)
Platelets: 200 10*3/uL (ref 150–400)
RBC: 4.52 MIL/uL (ref 3.87–5.11)
RDW: 13.2 % (ref 11.5–15.5)
WBC: 6.8 10*3/uL (ref 4.0–10.5)

## 2014-11-04 LAB — TROPONIN I
Troponin I: <0.03 ng/mL (ref <0.031)
Troponin I: <0.03 ng/mL (ref <0.031)

## 2014-11-04 LAB — HEMOGLOBIN A1C
Hgb A1c MFr Bld: 5.5 % (ref 4.8–5.6)
Mean Plasma Glucose: 111 mg/dL

## 2014-11-04 MED ORDER — PHENAZOPYRIDINE HCL 200 MG PO TABS
200.0000 mg | ORAL_TABLET | Freq: Three times a day (TID) | ORAL | Status: DC
  Administered 2014-11-04: 200 mg via ORAL
  Filled 2014-11-04 (×5): qty 1

## 2014-11-04 MED ORDER — CIPROFLOXACIN HCL 500 MG PO TABS: 500.0000 mg | ORAL_TABLET | Freq: Two times a day (BID) | ORAL | Status: DC

## 2014-11-04 MED ORDER — BACLOFEN 10 MG PO TABS: 5.0000 mg | ORAL_TABLET | Freq: Three times a day (TID) | ORAL | Status: DC

## 2014-11-04 MED ORDER — BACLOFEN 10 MG PO TABS
5.0000 mg | ORAL_TABLET | Freq: Two times a day (BID) | ORAL | Status: DC
  Administered 2014-11-04: 5 mg via ORAL
  Filled 2014-11-04 (×2): qty 1

## 2014-11-04 MED ORDER — BACLOFEN 10 MG PO TABS: 5.0000 mg | ORAL_TABLET | Freq: Two times a day (BID) | ORAL | Status: DC

## 2014-11-04 NOTE — Progress Notes (Signed)
  Echocardiogram 2D Echocardiogram has been performed.  Amalie Koran 11/04/2014, 10:20 AM

## 2014-11-04 NOTE — Care Management Note (Signed)
Case Management Note  Patient Details  Name: [name] MRN: [id number] Date of Birth: [birth year]  Subjective/Objective:     Pt admitted for chest pain. Per pt she has insurance via [employer]. CM did provide pt with [facility] # and stated she could call the number on back if insurance card to find PCP that accepts her insurance.                Action/Plan: Per pt she just recently left her husband. Moved from [place] and lives with a friend. CM was trying to set up [facility] services, However unsure if friend will allow [facility] Services in the home. Pt will be with friend until the end of the month. CM did ask CSW to provide housing resources to pt. No further needs from CM at this time.    Expected Discharge Date:                  Expected Discharge Plan:  Home w Home Health Services  In-House Referral:  NA  Discharge planning Services  CM Consult  Post Acute Care Choice:  Home Health Choice offered to:  Patient  DME Arranged:  N/A DME Agency:     HH Arranged:    HH Agency:     Status of Service:  In process, will continue to follow  Medicare Important Message Given:    Date Medicare IM Given:    Medicare IM give by:    Date Additional Medicare IM Given:    Additional Medicare Important Message give by:     If discussed at Long Length of Stay Meetings, dates discussed:    Additional Comments:  Gala Lewandowsky, RN 11/04/2014, 1:49 PM

## 2014-11-04 NOTE — Evaluation (Signed)
Occupational Therapy Evaluation Patient Details Name: Carla Bowen MRN: [id number] DOB: 1955/07/01 Today's Date: 11/04/2014    History of Present Illness 59 y/o WF admitted with L UE and chest pain. 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    Clinical Impression   Patient evaluated by Occupational Therapy with no further acute OT needs identified. All education has been completed and the patient has no further questions. See below for any follow-up Occupational Therapy or equipment needs. OT to sign off. Thank you for referral.      Follow Up Recommendations  No OT follow up    Equipment Recommendations  None recommended by OT    Recommendations for Other Services       Precautions / Restrictions Precautions Precautions: Fall Restrictions Weight Bearing Restrictions: No      Mobility Bed Mobility Overal bed mobility: Independent                Transfers Overall transfer level: Modified independent                    Balance Overall balance assessment: Needs assistance         Standing balance support: During functional activity Standing balance-Leahy Scale: Fair Standing balance comment: Pt able to stand at sink and swish mouthwash before being taken to test by transport                            ADL Overall ADL's : At baseline                                             Vision     Perception     Praxis      Pertinent Vitals/Pain Pain Assessment: 0-10 Pain Score: 5  Pain Location: L arm, L hip, L side of face Pain Descriptors / Indicators: Tightness;Tingling Pain Intervention(s): Limited activity within patient's tolerance     Hand Dominance Right   Extremity/Trunk Assessment Upper Extremity Assessment Upper Extremity Assessment: LUE deficits/detail LUE Deficits / Details: spasticity able to functionally use it to open containers   Lower Extremity  Assessment Lower Extremity Assessment: Defer to PT evaluation LLE Deficits / Details: ROM is WNL when tested, but decreased range noted during functional movement due to increased tone LLE Sensation: decreased proprioception   Cervical / Trunk Assessment Cervical / Trunk Assessment: Normal   Communication Communication Communication: No difficulties   Cognition Arousal/Alertness: Awake/alert Behavior During Therapy: WFL for tasks assessed/performed Overall Cognitive Status: Within Functional Limits for tasks assessed                     General Comments       Exercises       Shoulder Instructions      Home Living Family/patient expects to be discharged to:: Private residence Living Arrangements: Non-relatives/Friends Available Help at Discharge: Available PRN/intermittently Type of Home: House Home Access: Stairs to enter Secretary/administrator of Steps: 5 Entrance Stairs-Rails: Right;Left Home Layout: One level     Bathroom Shower/Tub: Producer, television/film/video: Handicapped height     Home Equipment: Cane - quad;Other (comment);Shower seat - built in;Grab bars - tub/shower   Additional Comments: Pt reports she just left her husband 2 weeks ago and was  unable to bring her shower seat with her.      Prior Functioning/Environment Level of Independence: Independent with assistive device(s)        Comments: Ambulates with quad cane for longer distance    OT Diagnosis:     OT Problem List:     OT Treatment/Interventions:      OT Goals(Current goals can be found in the care plan section) Acute Rehab OT Goals Patient Stated Goal: Go home and get some PT Potential to Achieve Goals: Good  OT Frequency:     Barriers to D/C:            Co-evaluation              End of Session Nurse Communication: Mobility status;Precautions  Activity Tolerance: Patient tolerated treatment well Patient left: in bed;with call bell/phone within reach    Time: 1133-1200 OT Time Calculation (min): 27 min Charges:  OT General Charges $OT Visit: 1 Procedure OT Evaluation $Initial OT Evaluation Tier I: 1 Procedure OT Treatments $Self Care/Home Management : 8-22 mins G-Codes: OT G-codes **NOT FOR INPATIENT CLASS** Functional Assessment Tool Used: clinical judgement Functional Limitation: Self care Self Care Current Status (V4098): At least 1 percent but less than 20 percent impaired, limited or restricted Self Care Goal Status (J1914): At least 1 percent but less than 20 percent impaired, limited or restricted Self Care Discharge Status [phone]): At least 1 percent but less than 20 percent impaired, limited or restricted  Carla Bowen 11/04/2014, 12:19 PM  Mateo Flow   OTR/L Pager: [phone] Office: [phone] .

## 2014-11-04 NOTE — Progress Notes (Signed)
[facility] PROGRESS NOTE   EURIKA SANDY ZOX:[id number] DOB: [birth year] DOA: 11/03/2014 PCP: No primary care provider on file.  HPI/Subjective: Still complaining about significant left arm pain Denies any chest pain.  Assessment/Plan: Principal Problem:   Chest pain Active Problems:   Spastic hemiplegia affecting nondominant side   UTI (lower urinary tract infection)   HTN (hypertension)   1. Chest pain. Patient presenting with complaints of arm and chest pain having atypical features. I suspect her left-sided chest pain may be radiating from severe arm pain as a result of spasticity. Chest pain is likely secondary to arm and left shoulder pain, from spasticity. 12-lead EKG and 3 sets of cardiac enzymes negative for acute cardiac events. 2. Left-sided spasticity with history of CVA. Will continue gabapentin 200 mg by mouth 3 times a day. Seen by neurology and started on baclofen. 3. History of CVA. Patient diagnosed with right thalamic intracerebral hemorrhage in 2014. She initially presented as a code CVA given reports of worsening left-sided weakness. Left arm pain seems to be her major complaint. She was seen and evaluated by neurology who recommended starting aspirin 81 mg by mouth daily for primary prevention as she had not been on antiplatelets therapy prior to this presentation. MRI did not show any changes. 4. Hypertension. Patient having elevated blood pressures in the emergency department. Will provide as needed Labetolol 10 mg every 2 hours for systolic blood pressures greater than 170. Will continue home regimen with Norvasc 10 mg by mouth daily and lisinopril 20 mg by mouth daily. 5. Urinary tract infection. Urinalysis showed the presence of many bacteria and nitrates. Will treat with ceftriaxone 1 g IV every 24 hours, Waiting for cultures, may discharge in a.m.  Code Status: Full Code Family Communication: Plan discussed with the patient. Disposition Plan: Remains  inpatient Diet: Diet Heart Room service appropriate?: Yes; Fluid consistency:: Thin  Consultants:  neurology  Procedures:  none  Antibiotics:  none   Objective: Filed Vitals:   11/04/14 1224  BP: 141/77  Pulse: 65  Temp: 98.7 F (37.1 C)  Resp:    No intake or output data in the 24 hours ending 11/04/14 1431 Filed Weights   11/03/14 1354 11/03/14 2020  Weight: 49.9 kg (110 lb 0.2 oz) 46.63 kg (102 lb 12.8 oz)    Exam: General: Alert and awake, oriented x3, not in any acute distress. HEENT: anicteric sclera, pupils reactive to light and accommodation, EOMI CVS: S1-S2 clear, no murmur rubs or gallops Chest: clear to auscultation bilaterally, no wheezing, rales or rhonchi Abdomen: soft nontender, nondistended, normal bowel sounds, no organomegaly Extremities: no cyanosis, clubbing or edema noted bilaterally Neuro: Cranial nerves II-XII intact, no focal neurological deficits  Data Reviewed: Basic Metabolic Panel:  Recent Labs Lab 11/03/14 1350 11/03/14 1354 11/03/14 2139 11/04/14 0420  NA 140 138  --  136  K 3.8 3.7  --  3.8  CL 104 104  --  104  CO2 25  --   --  23  GLUCOSE 110* 106*  --  134*  BUN 21* 27*  --  15  CREATININE 0.68 0.60 0.71 0.58  CALCIUM 9.5  --   --  9.1   Liver Function Tests:  Recent Labs Lab 11/03/14 1350  AST 20  ALT 14  ALKPHOS 75  BILITOT 0.6  PROT 6.6  ALBUMIN 4.0   No results for input(s): LIPASE, AMYLASE in the last 168 hours. No results for input(s): AMMONIA in the last 168 hours.  CBC:  Recent Labs Lab 11/03/14 1350 11/03/14 1354 11/03/14 2139 11/04/14 0420  WBC 7.0  --  6.8 6.8  NEUTROABS 4.0  --   --   --   HGB 14.3 15.0 14.1 14.3  HCT 41.4 44.0 41.9 42.8  MCV 93.9  --  94.4 94.7  PLT 212  --  213 200   Cardiac Enzymes:  Recent Labs Lab 11/03/14 2139 11/04/14 0420  TROPONINI <0.03 <0.03   BNP (last 3 results) No results for input(s): BNP in the last 8760 hours.  ProBNP (last 3 results) No  results for input(s): PROBNP in the last 8760 hours.  CBG: No results for input(s): GLUCAP in the last 168 hours.  Micro No results found for this or any previous visit (from the past 240 hour(s)).   Studies: Ct Head Wo Contrast  11/03/2014   CLINICAL DATA:  59 year old with code stroke.  Left-sided weakness.  EXAM: CT HEAD WITHOUT CONTRAST  TECHNIQUE: Contiguous axial images were obtained from the base of the skull through the vertex without contrast.  COMPARISON:  None  FINDINGS: There is low-density in the right thalamus that extends into the posterior right white matter tract. Suspect this represents an old infarct. There is no evidence for acute hemorrhage, mass lesion, midline shift or hydrocephalus. Mastoid air cells are clear. There is mild mucosal thickening in the sphenoid sinuses. No acute bone abnormality. There are high-density areas along the left frontal lobe on images 15 and 16 which likely represent bone artifact.  IMPRESSION: Negative for acute hemorrhage.  Suspect old ischemic changes involving the right thalamus and right white matter tract.  High-density areas along the left frontal bone probably represent artifact and do not correspond with patient's clinical symptoms.  These results were called by telephone at the time of interpretation on 11/03/2014 at 2:00 pm to Dr. Pearlean Brownie, who verbally acknowledged these results.   Electronically Signed   By: Richarda Overlie M.D.   On: 11/03/2014 14:04   Mr Brain Wo Contrast  11/03/2014   CLINICAL DATA:  59 year old female with history of 2014 stroke with residual left side deficits. Chief complaint of increased left upper extremity pain today. Initial encounter.  EXAM: MRI HEAD WITHOUT CONTRAST  TECHNIQUE: Multiplanar, multiecho pulse sequences of the brain and surrounding structures were obtained without intravenous contrast.  COMPARISON:  Head CT without contrast 1346 hrs.  FINDINGS: The examination had to be discontinued prior to completion due to  patient agitation/anxiety and refusal to continue. Only coronal T2 weighted imaging was not obtained.  Chronic right thalamic and posterior right corona radiata lacunar type infarct with abundant hemosiderin. Chronic hemosiderin about the atrium of the right lateral ventricle. Chronic micro hemorrhage in the contralateral medial left thalamus.  No restricted diffusion or evidence of acute infarction. Major intracranial vascular flow voids are within normal limits.  There is mild Wallerian degeneration in the right brainstem. Elsewhere mild for age scattered cerebral white matter T2 and FLAIR hyperintensity. No midline shift, mass effect, evidence of mass lesion, ventriculomegaly, extra-axial collection or acute intracranial hemorrhage. Cervicomedullary junction and pituitary are within normal limits. Negative visualized cervical spine.  Normal bone marrow signal Visible internal auditory structures appear normal. Mastoids are clear.  Sphenoid sinus mucosal thickening. Other paranasal sinuses are clear. Orbit and scalp soft tissues appear normal.  IMPRESSION: 1.  No acute intracranial abnormality. 2. Chronic hemorrhagic infarct centered at the right thalamus. Chronic micro hemorrhage also in the left thalamus.   Electronically Signed  By: Odessa Fleming M.D.   On: 11/03/2014 19:47    Scheduled Meds: . amLODipine  10 mg Oral Daily  . aspirin EC  81 mg Oral Daily  . baclofen  5 mg Oral BID   Followed by  . [START ON 11/11/2014] baclofen  5 mg Oral TID  . cefTRIAXone (ROCEPHIN)  IV  1 g Intravenous Q24H  . enoxaparin (LOVENOX) injection  40 mg Subcutaneous Q24H  . gabapentin  200 mg Oral TID  . lisinopril  20 mg Oral Daily  . pantoprazole  40 mg Oral Daily  . phenazopyridine  200 mg Oral TID WC  . sodium chloride  3 mL Intravenous Q12H  . venlafaxine  75 mg Oral BID WC   Continuous Infusions:      Time spent: 35 minutes    [facility]  [facility] Pager [phone] If 7PM-7AM, please contact  night-coverage at [email or web address], password [facility] 11/04/2014, 2:31 PM

## 2014-11-04 NOTE — Progress Notes (Signed)
STROKE TEAM PROGRESS NOTE  Patient feels she is better and left-sided numbness and strength have improved. She still has tightness in the left hand and shoulder. She has never tried any medications for spasticity. Her MRI scan which have personally reviewed showed no acute stroke.  Vital signs stable.  Physical Exam . Frail middle-aged Caucasian lady not in distress Afebrile. Head is nontraumatic. Neck is supple without bruit. Cardiac exam no murmur or gallop. Lungs are clear to auscultation. Distal pulses are well felt. Neurological Exam :  Awake alert oriented 3 normal speech and language function. Fundi were not visualized. Vision acuity seems adequate. Extraocular movements are pharyngeal or nystagmus. She brings to threat bilaterally. No visual field deficit. Mild left lower facial asymmetry when she smiles. Tongue midline. Motor system exam mild left upper and lower extremity drift with tremulousness. Mild weakness of left grip and intrinsic hand muscles. Mild weakness of left hip flexors and ankle dorsiflexors. Tone is mildly increased on the left side. Mild left finger-to-nose and knee to dysmetria. Left hemibody is diminished touch pinprick sensation. Gait was not tested MRI with No acute stroke. Assessment/Plan: 47 year Caucasian lady with subjective left chest and shoulder pain, tightness with subjective worsening of existing left-sided sensory and motor deficits of unclear etiology.MRI shows no evidence of new right brain stroke.  Suspect this is transient worsening of her deficits in the setting of an acute medical illness No further stroke workup indicated. Have discontinued carotid doppler. Left echo given chest pain.  R arm spasticity.   Baclofen 5 gm bid x 1 week then increased to tid due to tightness in the arm and spasticity.  Follow up with Dr Pearlean Brownie in office in 2 months  Ok for discharge from stroke standpoint.  Rhoderick Moody [facility] See Amion  for Pager information 11/04/2014 8:58 AM  I have personally examined this patient, reviewed notes, independently viewed imaging studies, participated in medical decision making and plan of care. I have made any additions or clarifications directly to the above note. Agree with note above.   Delia Heady, MD Medical Director [facility] Pager: [phone] 11/04/2014 2:18 PM

## 2014-11-04 NOTE — Evaluation (Signed)
Physical Therapy Evaluation Patient Details Name: Carla Bowen MRN: [id number] DOB: 1956-03-05 Today's Date: 11/04/2014   History of Present Illness  59 y/o WF admitted with L UE and chest pain. 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   Clinical Impression  Pt admitted with above diagnosis. Pt currently with functional limitations due to the deficits listed below (see PT Problem List). Pt will benefit from skilled PT to increase their independence and safety with mobility to allow discharge to the venue listed below.  Pt just moved from a home with no steps to a home with 5 steps to enter.  Recommend HHPT to assess safety in new home environment.  Unable to assess stairs at time of eval, due to pt needing to be taken to a test.  No DME needs from PT standpoint.     Follow Up Recommendations Home health PT    Equipment Recommendations  None recommended by PT    Recommendations for Other Services       Precautions / Restrictions Precautions Precautions: Fall Restrictions Weight Bearing Restrictions: No      Mobility  Bed Mobility Overal bed mobility: Independent                Transfers Overall transfer level: Modified independent                  Ambulation/Gait Ambulation/Gait assistance: Min assist;Min guard Ambulation Distance (Feet): 220 Feet Assistive device: None Gait Pattern/deviations: Decreased dorsiflexion - left     General Gait Details: Decreased flexion during L swing phase with increased lateral trunk sway with L UE in flexor tone synergy pattern. 1 LOb when someone opened a door and she looked behind her to see who it was.  Educated on safety.  Stairs            Wheelchair Mobility    Modified Rankin (Stroke Patients Only)       Balance Overall balance assessment: Needs assistance         Standing balance support: During functional activity Standing balance-Leahy Scale:  Fair Standing balance comment: Pt able to stand at sink and swish mouthwash before being taken to test by transport                             Pertinent Vitals/Pain Pain Assessment: 0-10 Pain Score: 5  Pain Location: L arm, L hip, L side of face Pain Descriptors / Indicators: Tightness;Tingling Pain Intervention(s): Limited activity within patient's tolerance    Home Living Family/patient expects to be discharged to:: Private residence Living Arrangements: Non-relatives/Friends Available Help at Discharge: Available PRN/intermittently Type of Home: House Home Access: Stairs to enter Entrance Stairs-Rails: Doctor, general practice of Steps: 5 Home Layout: One level Home Equipment: Cane - quad;Other (comment) (hemi-walker) Additional Comments: Pt reports she just left her husband 2 weeks ago and was unable to bring her shower seat with her.    Prior Function Level of Independence: Independent with assistive device(s)         Comments: Ambulates with quad cane for longer distance     Hand Dominance   Dominant Hand: Right    Extremity/Trunk Assessment   Upper Extremity Assessment: Defer to OT evaluation           Lower Extremity Assessment: LLE deficits/detail   LLE Deficits / Details: ROM is WNL when tested, but decreased range noted during functional  movement due to increased tone  Cervical / Trunk Assessment: Normal  Communication      Cognition Arousal/Alertness: Awake/alert Behavior During Therapy: WFL for tasks assessed/performed Overall Cognitive Status: Within Functional Limits for tasks assessed                      General Comments      Exercises        Assessment/Plan    PT Assessment Patient needs continued PT services  PT Diagnosis Abnormality of gait   PT Problem List Decreased strength;Decreased activity tolerance;Decreased balance;Decreased mobility  PT Treatment Interventions Gait training;Stair  training;Functional mobility training;Balance training;Therapeutic exercise;Therapeutic activities;Neuromuscular re-education   PT Goals (Current goals can be found in the Care Plan section) Acute Rehab PT Goals Patient Stated Goal: Go home and get some PT PT Goal Formulation: With patient Time For Goal Achievement: 11/18/14 Potential to Achieve Goals: Good    Frequency Min 3X/week   Barriers to discharge        Co-evaluation               End of Session Equipment Utilized During Treatment: Gait belt Activity Tolerance: Patient tolerated treatment well Patient left: in bed;Other (comment) (transport taking to test) Nurse Communication: Mobility status    Functional Assessment Tool Used: objective findings and clinical judgement Functional Limitation: Mobility: Walking and moving around Mobility: Walking and Moving Around Current Status (Z6109): At least 1 percent but less than 20 percent impaired, limited or restricted Mobility: Walking and Moving Around Goal Status [phone]): 0 percent impaired, limited or restricted    Time: 0902-0929 PT Time Calculation (min) (ACUTE ONLY): 27 min   Charges:   PT Evaluation $Initial PT Evaluation Tier I: 1 Procedure PT Treatments $Gait Training: 8-22 mins   PT G Codes:   PT G-Codes **NOT FOR INPATIENT CLASS** Functional Assessment Tool Used: objective findings and clinical judgement Functional Limitation: Mobility: Walking and moving around Mobility: Walking and Moving Around Current Status (U9811): At least 1 percent but less than 20 percent impaired, limited or restricted Mobility: Walking and Moving Around Goal Status [phone]): 0 percent impaired, limited or restricted    Tracy Kinner LUBECK 11/04/2014, 9:39 AM

## 2014-11-04 NOTE — Discharge Summary (Signed)
Physician Discharge Summary  Carla VANDERWEELE Bowen:[id number] DOB: 05-27-1955 DOA: 11/03/2014  PCP: No primary care provider on file.  Admit date: 11/03/2014 Discharge date: 11/04/2014  Time spent: 30 minutes  Recommendations for Outpatient Follow-up:  1. Follow up with Dr. Pearlean Brownie as out patient  Discharge Diagnoses:  Principal Problem:   Chest pain Active Problems:   Spastic hemiplegia affecting nondominant side   UTI (lower urinary tract infection)   HTN (hypertension)   Chest tightness   History of stroke   Left hemiparesis   Stroke   Discharge Condition: Stable  Diet recommendation: Heart Healthy  Filed Weights   11/03/14 1354 11/03/14 2020  Weight: 49.9 kg (110 lb 0.2 oz) 46.63 kg (102 lb 12.8 oz)    History of present illness:  Carla Bowen is a 59 y.o. female with a past medical history of CVA in 2014 having left-sided residual hemiparesis and left-sided spasticity, hypertension, presented to the emergency department with complaints of left upper extremity pain. She reports having a history of left upper extremity pain that she reports is constant for the most part having 3-4/10 intensity pain, however, this morning she woke up having severe arm pain with intensity of 10/10, associated with tingling, burning, and worsening weakness. She presented as a code CVA and was evaluated by neurology in the emergency department. A stat CT scan of the brain did not reveal acute intracranial abnormality. Old right thalamic infarct appears stable. During my encounter she complains of ongoing left upper extremity pain that radiates to her left thoracic region. She denies history of exertional chest pain or shortness of breath. She also denies palpitations, fevers, chills, nausea, vomiting, abdominal pain, syncope, presyncope. Her troponin was negative on presentation. EKG did not show acute ischemic changes.  Hospital Course:   1. Chest pain. Patient presenting with complaints of arm  and chest pain having atypical features. I suspect her left-sided chest pain may be radiating from severe arm pain as a result of spasticity. Chest pain is likely secondary to arm and left shoulder pain, from spasticity. 12-lead EKG and 3 sets of cardiac enzymes negative for acute cardiac events. No more cardiac workup intended. 2. Left-sided spasticity with history of CVA. Will continue gabapentin 200 mg by mouth 3 times a day. Seen by neurology and started on baclofen. Follow up with Neuro stroke clinic as out patient.  3. History of CVA. Patient diagnosed with right thalamic intracerebral hemorrhage in 2014. She initially presented as a code CVA given reports of worsening left-sided weakness. Left arm pain seems to be her major complaint. She was seen and evaluated by neurology who recommended starting aspirin 81 mg by mouth daily for primary prevention as she had not been on antiplatelets therapy prior to this presentation. MRI did not show any changes. 4. Hypertension. Patient having elevated blood pressures in the emergency department. Will provide as needed Labetolol 10 mg every 2 hours for systolic blood pressures greater than 170. Will continue home regimen with Norvasc 10 mg by mouth daily and lisinopril 20 mg by mouth daily. 5. Urinary tract infection. Urinalysis showed the presence of many bacteria and nitrates. Discharged on Cipro for 3 more days  Procedures:  None  Consultations:  Neuro  Discharge Exam: Filed Vitals:   11/04/14 1224  BP: 141/77  Pulse: 65  Temp: 98.7 F (37.1 C)  Resp:    General: Alert and awake, oriented x3, not in any acute distress. HEENT: anicteric sclera, pupils reactive to light and accommodation,  EOMI CVS: S1-S2 clear, no murmur rubs or gallops Chest: clear to auscultation bilaterally, no wheezing, rales or rhonchi Abdomen: soft nontender, nondistended, normal bowel sounds, no organomegaly Extremities: Lt side spasticity, shuffling gate Neuro:  Cranial nerves II-XII intact, no focal neurological deficits   Discharge Instructions   Discharge Instructions    Diet - low sodium heart healthy    Complete by:  As directed      Increase activity slowly    Complete by:  As directed           Current Discharge Medication List    START taking these medications   Details  baclofen (LIORESAL) 10 MG tablet Take 0.5 tablets (5 mg total) by mouth 2 (two) times daily. Qty: 60 each, Refills: 0      CONTINUE these medications which have NOT CHANGED   Details  amLODipine (NORVASC) 10 MG tablet Take 1 tablet (10 mg total) by mouth daily. Qty: 90 tablet, Refills: 0    ibuprofen (ADVIL,MOTRIN) 200 MG tablet Take 400 mg by mouth every 6 (six) hours as needed (pain).    lisinopril (PRINIVIL,ZESTRIL) 20 MG tablet Take 1 tablet (20 mg total) by mouth daily. Qty: 30 tablet, Refills: 0    venlafaxine (EFFEXOR) 75 MG tablet TAKE 1 TABLET (75 MG TOTAL) BY MOUTH DAILY. Qty: 30 tablet, Refills: 0    gabapentin (NEURONTIN) 100 MG capsule Take 200 mg by mouth 3 (three) times daily. 2 tablets bid    lansoprazole (PREVACID) 30 MG capsule Take 1 capsule (30 mg total) by mouth daily at 12 noon. Qty: 14 capsule, Refills: 0    promethazine (PHENERGAN) 25 MG tablet Take 1 tablet (25 mg total) by mouth every 6 (six) hours as needed for nausea. Qty: 10 tablet, Refills: 0    traMADol (ULTRAM) 50 MG tablet Take 50 mg by mouth every 6 (six) hours as needed (pain).       Allergies  Allergen Reactions  . Codeine Hives  . Sulfa Antibiotics Hives      The results of significant diagnostics from this hospitalization (including imaging, microbiology, ancillary and laboratory) are listed below for reference.    Significant Diagnostic Studies: Ct Head Wo Contrast  11/03/2014   CLINICAL DATA:  59 year old with code stroke.  Left-sided weakness.  EXAM: CT HEAD WITHOUT CONTRAST  TECHNIQUE: Contiguous axial images were obtained from the base of the skull  through the vertex without contrast.  COMPARISON:  None  FINDINGS: There is low-density in the right thalamus that extends into the posterior right white matter tract. Suspect this represents an old infarct. There is no evidence for acute hemorrhage, mass lesion, midline shift or hydrocephalus. Mastoid air cells are clear. There is mild mucosal thickening in the sphenoid sinuses. No acute bone abnormality. There are high-density areas along the left frontal lobe on images 15 and 16 which likely represent bone artifact.  IMPRESSION: Negative for acute hemorrhage.  Suspect old ischemic changes involving the right thalamus and right white matter tract.  High-density areas along the left frontal bone probably represent artifact and do not correspond with patient's clinical symptoms.  These results were called by telephone at the time of interpretation on 11/03/2014 at 2:00 pm to Dr. Pearlean Brownie, who verbally acknowledged these results.   Electronically Signed   By: Richarda Overlie M.D.   On: 11/03/2014 14:04   Mr Brain Wo Contrast  11/03/2014   CLINICAL DATA:  59 year old female with history of 2014 stroke with residual left side deficits.  Chief complaint of increased left upper extremity pain today. Initial encounter.  EXAM: MRI HEAD WITHOUT CONTRAST  TECHNIQUE: Multiplanar, multiecho pulse sequences of the brain and surrounding structures were obtained without intravenous contrast.  COMPARISON:  Head CT without contrast 1346 hrs.  FINDINGS: The examination had to be discontinued prior to completion due to patient agitation/anxiety and refusal to continue. Only coronal T2 weighted imaging was not obtained.  Chronic right thalamic and posterior right corona radiata lacunar type infarct with abundant hemosiderin. Chronic hemosiderin about the atrium of the right lateral ventricle. Chronic micro hemorrhage in the contralateral medial left thalamus.  No restricted diffusion or evidence of acute infarction. Major intracranial  vascular flow voids are within normal limits.  There is mild Wallerian degeneration in the right brainstem. Elsewhere mild for age scattered cerebral white matter T2 and FLAIR hyperintensity. No midline shift, mass effect, evidence of mass lesion, ventriculomegaly, extra-axial collection or acute intracranial hemorrhage. Cervicomedullary junction and pituitary are within normal limits. Negative visualized cervical spine.  Normal bone marrow signal Visible internal auditory structures appear normal. Mastoids are clear.  Sphenoid sinus mucosal thickening. Other paranasal sinuses are clear. Orbit and scalp soft tissues appear normal.  IMPRESSION: 1.  No acute intracranial abnormality. 2. Chronic hemorrhagic infarct centered at the right thalamus. Chronic micro hemorrhage also in the left thalamus.   Electronically Signed   By: Odessa Fleming M.D.   On: 11/03/2014 19:47    Microbiology: No results found for this or any previous visit (from the past 240 hour(s)).   Labs: Basic Metabolic Panel:  Recent Labs Lab 11/03/14 1350 11/03/14 1354 11/03/14 2139 11/04/14 0420  NA 140 138  --  136  K 3.8 3.7  --  3.8  CL 104 104  --  104  CO2 25  --   --  23  GLUCOSE 110* 106*  --  134*  BUN 21* 27*  --  15  CREATININE 0.68 0.60 0.71 0.58  CALCIUM 9.5  --   --  9.1   Liver Function Tests:  Recent Labs Lab 11/03/14 1350  AST 20  ALT 14  ALKPHOS 75  BILITOT 0.6  PROT 6.6  ALBUMIN 4.0   No results for input(s): LIPASE, AMYLASE in the last 168 hours. No results for input(s): AMMONIA in the last 168 hours. CBC:  Recent Labs Lab 11/03/14 1350 11/03/14 1354 11/03/14 2139 11/04/14 0420  WBC 7.0  --  6.8 6.8  NEUTROABS 4.0  --   --   --   HGB 14.3 15.0 14.1 14.3  HCT 41.4 44.0 41.9 42.8  MCV 93.9  --  94.4 94.7  PLT 212  --  213 200   Cardiac Enzymes:  Recent Labs Lab 11/03/14 2139 11/04/14 0420  TROPONINI <0.03 <0.03   BNP: BNP (last 3 results) No results for input(s): BNP in the last  8760 hours.  ProBNP (last 3 results) No results for input(s): PROBNP in the last 8760 hours.  CBG: No results for input(s): GLUCAP in the last 168 hours.     Signed:  Shaylie Eklund A  [facility] 11/04/2014, 4:44 PM

## 2014-11-13 IMAGING — US US ABDOMEN COMPLETE
1 series · 14 of 25 positions shown · non-contrast
Comparison: CT abdomen and pelvis 08/08/2013

CLINICAL DATA: Abdominal pain, vomiting

EXAM:
ULTRASOUND ABDOMEN COMPLETE

[Series 1: us abdomen complete · 0.26mm/px · 14 of 43 slices shown]
[im 1/43]
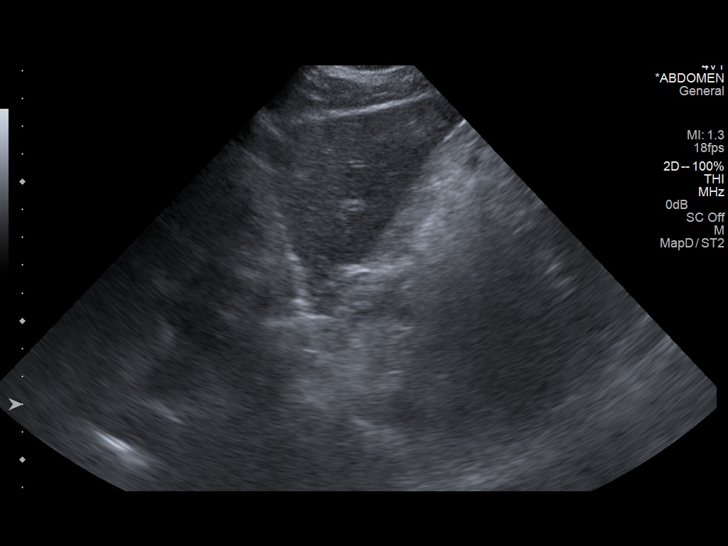
[im 4/43]
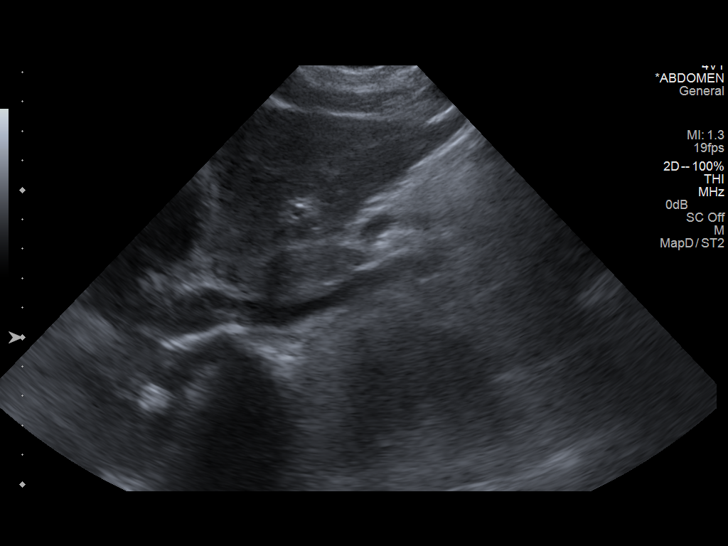
[im 8/43]
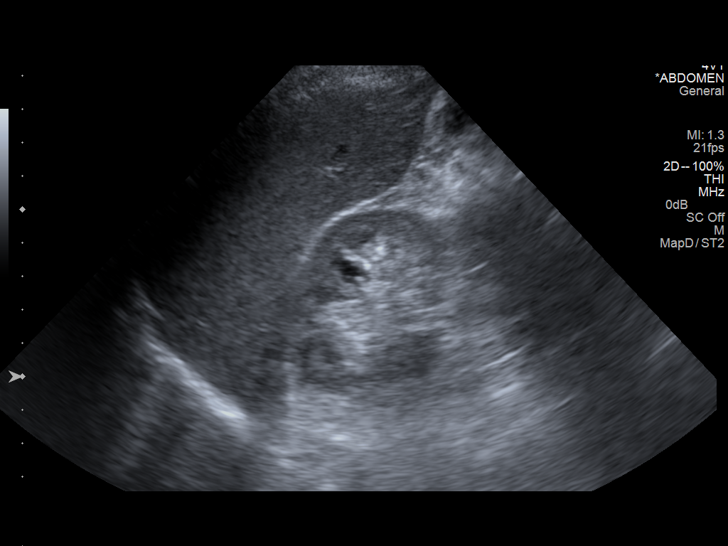
[im 11/43]
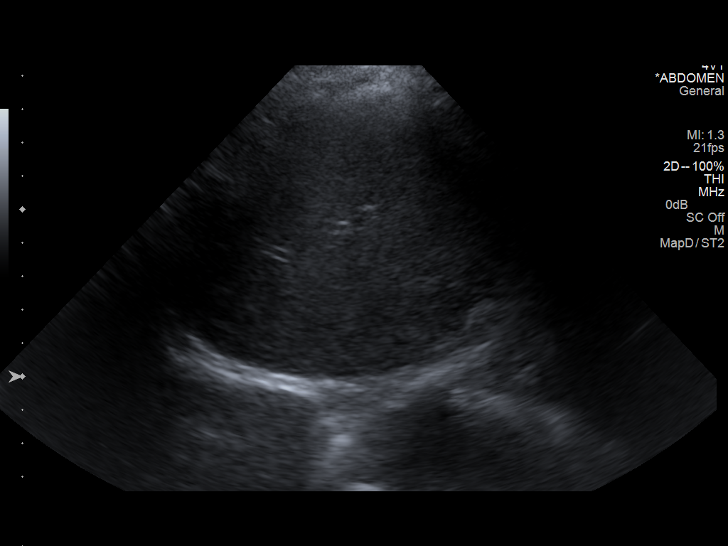
[im 15/43]
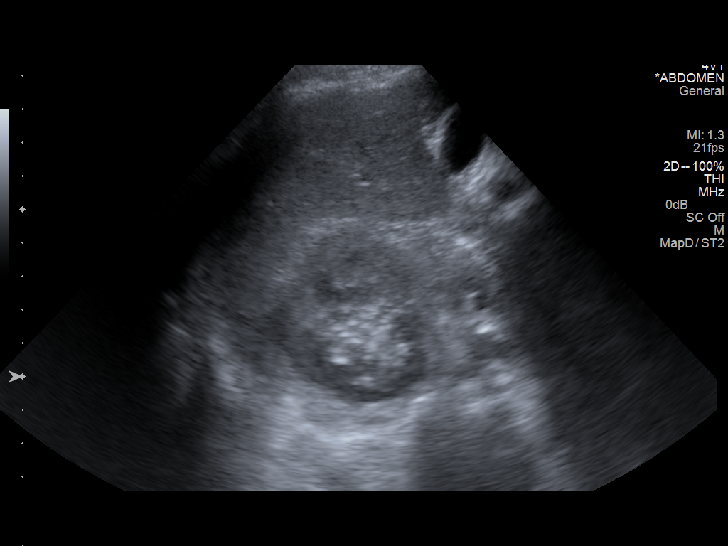
[im 16/43]
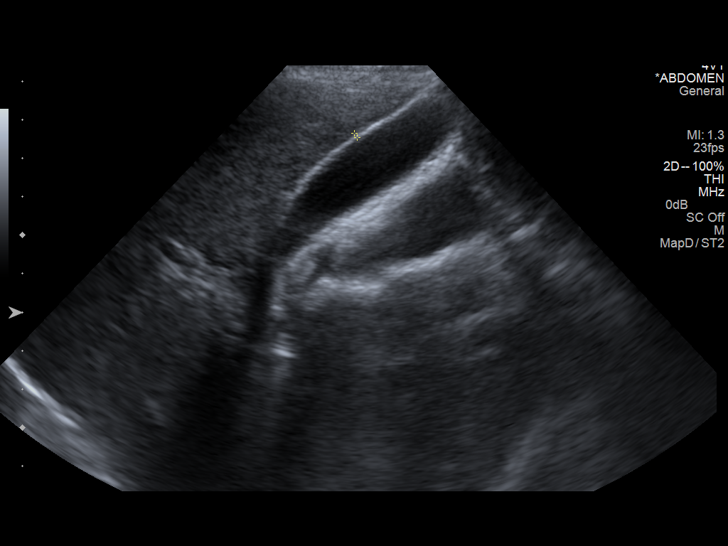
[im 20/43]
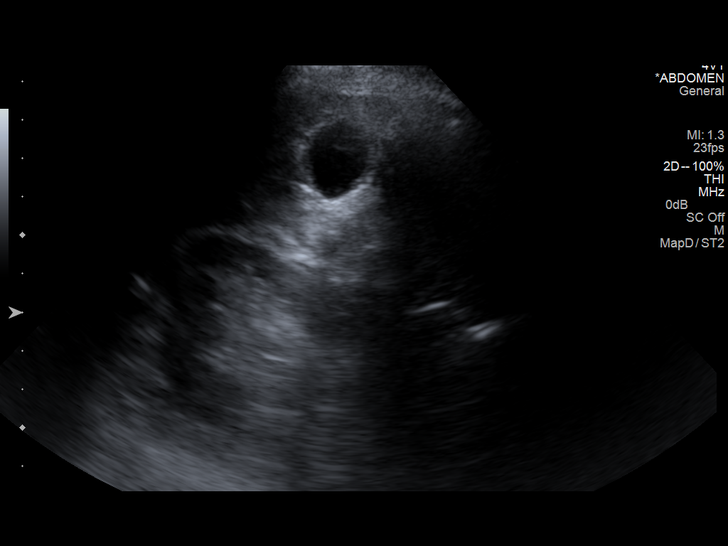
[im 23/43]
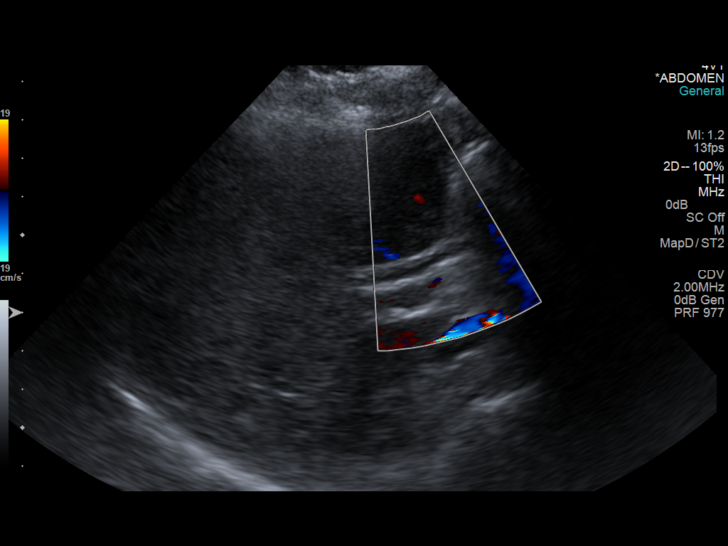
[im 27/43]
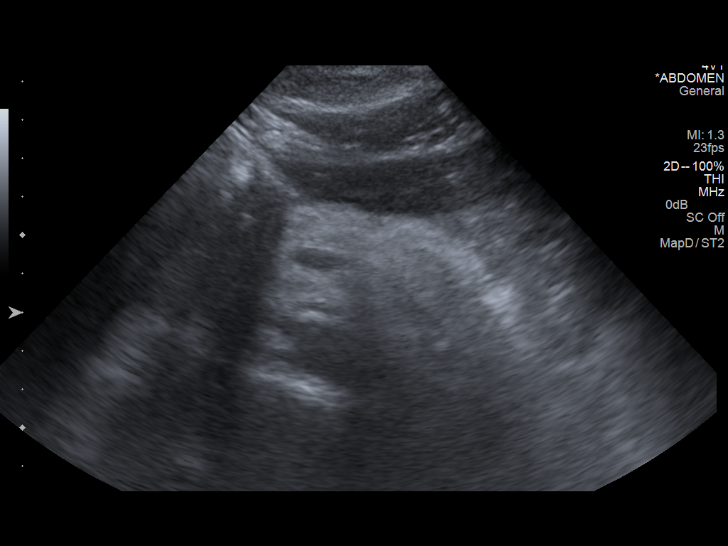
[im 29/43]
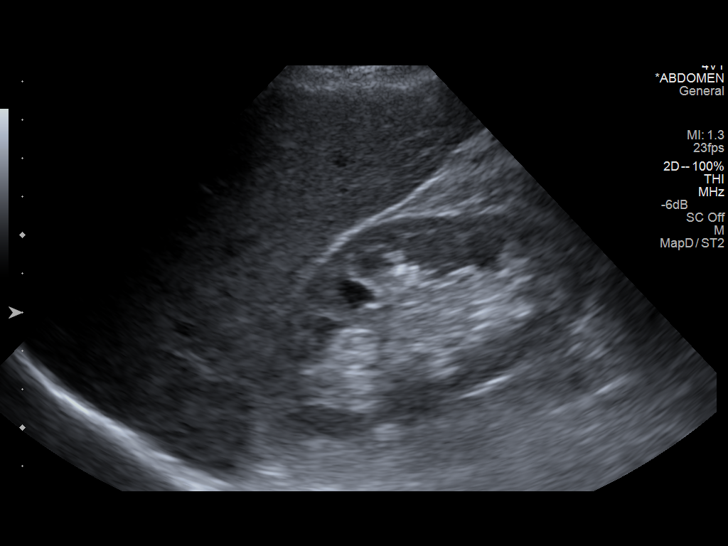
[im 32/43]
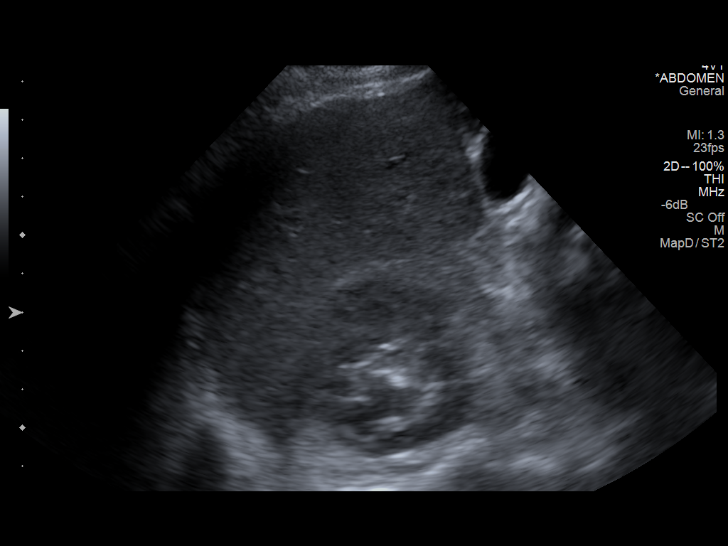
[im 36/43]
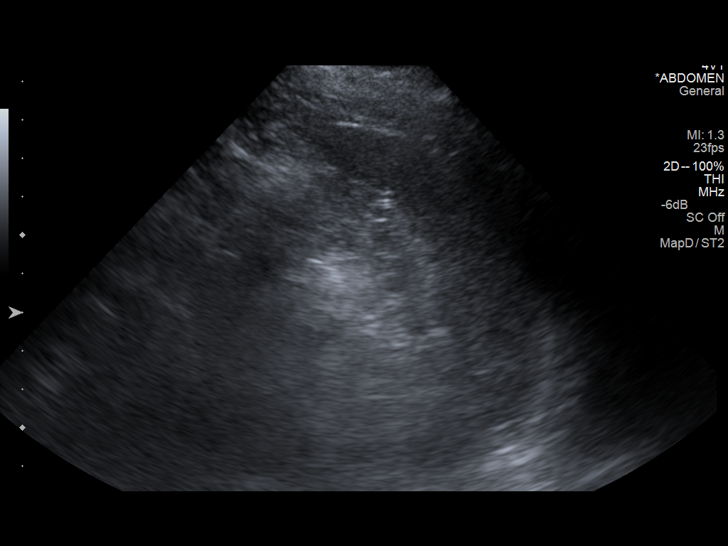
[im 39/43]
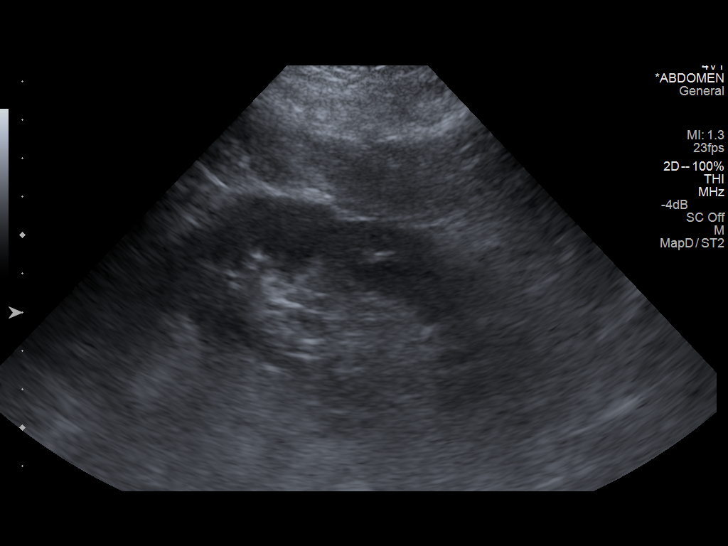
[im 43/43]
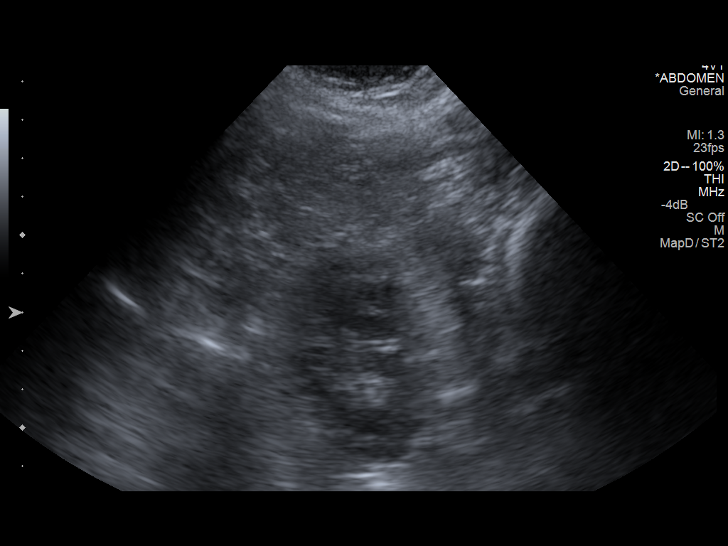

[14 of 25 positions shown; findings below may reference images not displayed]

FINDINGS: Gallbladder:

Normally distended without stones or wall thickening.

No pericholecystic fluid or sonographic Murphy sign.

Common bile duct:

Diameter: Normal caliber 4 mm diameter

Liver:

Normal appearance

IVC:

Normal appearance

Pancreas:

Normal appearance

Spleen:

Normal appearance, 8.1 cm length

Right Kidney:

Length: 9.5 cm. Normal morphology without solid mass or
hydronephrosis. Small cyst at upper pole 8 mm greatest size, also
seen on prior CT.

Left Kidney:

Length: 11.1 cm.  Normal morphology without mass or hydronephrosis.

Abdominal aorta:

Normal caliber

Other findings:

No free-fluid
IMPRESSION: Small RIGHT renal cyst.

Otherwise normal exam.

## 2015-12-24 ENCOUNTER — Emergency Department (HOSPITAL_COMMUNITY)
Admission: EM | Admit: 2015-12-24 | Discharge: 2015-12-24 | Disposition: A | Discharge status: Home / Self Care | Payer: Medicare Other | Attending: Emergency Medicine | Admitting: Emergency Medicine

## 2015-12-24 ENCOUNTER — Encounter (HOSPITAL_COMMUNITY): Payer: Self-pay | Admitting: Emergency Medicine

## 2015-12-24 ENCOUNTER — Emergency Department (HOSPITAL_COMMUNITY): Payer: Medicare Other

## 2015-12-24 DIAGNOSIS — G629 Polyneuropathy, unspecified: Secondary | ICD-10-CM | POA: Diagnosis not present

## 2015-12-24 DIAGNOSIS — R5383 Other fatigue: Secondary | ICD-10-CM | POA: Diagnosis present

## 2015-12-24 DIAGNOSIS — I10 Essential (primary) hypertension: Secondary | ICD-10-CM | POA: Insufficient documentation

## 2015-12-24 DIAGNOSIS — F17200 Nicotine dependence, unspecified, uncomplicated: Secondary | ICD-10-CM | POA: Insufficient documentation

## 2015-12-24 DIAGNOSIS — Z8673 Personal history of transient ischemic attack (TIA), and cerebral infarction without residual deficits: Secondary | ICD-10-CM | POA: Diagnosis not present

## 2015-12-24 LAB — CBC WITH DIFFERENTIAL/PLATELET
Basophils Absolute: 0.1 10*3/uL (ref 0.0–0.1)
Basophils Relative: 1 %
Eosinophils Absolute: 0.2 10*3/uL (ref 0.0–0.7)
Eosinophils Relative: 3 %
HCT: 45.6 % (ref 36.0–46.0)
Hemoglobin: 15.3 g/dL — ABNORMAL HIGH (ref 12.0–15.0)
Lymphocytes Relative: 46 %
Lymphs Abs: 3.1 10*3/uL (ref 0.7–4.0)
MCH: 32.1 pg (ref 26.0–34.0)
MCHC: 33.6 g/dL (ref 30.0–36.0)
MCV: 95.6 fL (ref 78.0–100.0)
Monocytes Absolute: 0.5 10*3/uL (ref 0.1–1.0)
Monocytes Relative: 7 %
Neutro Abs: 2.9 10*3/uL (ref 1.7–7.7)
Neutrophils Relative %: 43 %
Platelets: 216 10*3/uL (ref 150–400)
RBC: 4.77 MIL/uL (ref 3.87–5.11)
RDW: 12.8 % (ref 11.5–15.5)
WBC: 6.7 10*3/uL (ref 4.0–10.5)

## 2015-12-24 LAB — COMPREHENSIVE METABOLIC PANEL
ALT: 43 U/L (ref 14–54)
AST: 48 U/L — ABNORMAL HIGH (ref 15–41)
Albumin: 3.9 g/dL (ref 3.5–5.0)
Alkaline Phosphatase: 84 U/L (ref 38–126)
Anion gap: 11 (ref 5–15)
BUN: 14 mg/dL (ref 6–20)
CO2: 22 mmol/L (ref 22–32)
Calcium: 9 mg/dL (ref 8.9–10.3)
Chloride: 106 mmol/L (ref 101–111)
Creatinine, Ser: 0.56 mg/dL (ref 0.44–1.00)
GFR calc Af Amer: >60 mL/min (ref >60)
GFR calc non Af Amer: >60 mL/min (ref >60)
Glucose, Bld: 88 mg/dL (ref 65–99)
Potassium: 3.4 mmol/L — ABNORMAL LOW (ref 3.5–5.1)
Sodium: 139 mmol/L (ref 135–145)
Total Bilirubin: 0.5 mg/dL (ref 0.3–1.2)
Total Protein: 6.7 g/dL (ref 6.5–8.1)

## 2015-12-24 LAB — URINALYSIS, ROUTINE W REFLEX MICROSCOPIC
Bilirubin Urine: NEGATIVE
Glucose, UA: NEGATIVE mg/dL
Ketones, ur: NEGATIVE mg/dL
Nitrite: NEGATIVE
Protein, ur: NEGATIVE mg/dL
Specific Gravity, Urine: 1.004 — ABNORMAL LOW (ref 1.005–1.030)
pH: 7 (ref 5.0–8.0)

## 2015-12-24 LAB — TSH: TSH: 0.131 u[IU]/mL — ABNORMAL LOW (ref 0.350–4.500)

## 2015-12-24 LAB — URINE MICROSCOPIC-ADD ON

## 2015-12-24 LAB — LIPASE, BLOOD: Lipase: 16 U/L (ref 11–51)

## 2015-12-24 LAB — I-STAT BETA HCG BLOOD, ED (MC, WL, AP ONLY): I-stat hCG, quantitative: <5 m[IU]/mL (ref <5)

## 2015-12-24 LAB — T4, FREE: Free T4: 0.78 ng/dL (ref 0.61–1.12)

## 2015-12-24 MED ORDER — GABAPENTIN 100 MG PO CAPS: 300.0000 mg | ORAL_CAPSULE | Freq: Once | ORAL | Status: DC

## 2015-12-24 NOTE — ED Provider Notes (Signed)
[facility]-EMERGENCY DEPT Provider Note   CSN: [id number] Arrival date & time: 12/24/15  2013     History   Chief Complaint No chief complaint on file.   HPI Carla Bowen is a 60 y.o. female.  Patient complains of fatigue and left arm pain.  She has decreased use of her left UE s/p stroke and now is having burning and tingling pain to the arm.  She also complains of suprapubic abdominal pain and foul smelling urine.  No fevers.    Neurologic Problem  This is a chronic problem. Episode onset: 2 months, worse in past couple days. The problem occurs constantly. The problem has not changed since onset.Associated symptoms include abdominal pain (suprapubic). Pertinent negatives include no chest pain and no shortness of breath. Nothing aggravates the symptoms. Nothing relieves the symptoms.    Past Medical History:  Diagnosis Date  . Fibromyalgia   . High cholesterol   . Hypertension   . Stroke [facility])     Patient Active Problem List   Diagnosis Date Noted  . Chest tightness   . History of stroke   . Left hemiparesis (HCC)   . Stroke (HCC)   . Chest pain 11/03/2014  . UTI (lower urinary tract infection) 11/03/2014  . HTN (hypertension) 11/03/2014  . ICH (intracerebral hemorrhage) (HCC) 05/30/2013  . Spastic hemiplegia affecting nondominant side (HCC) 05/30/2013  . Abnormality of gait 05/30/2013  . 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 Disturbance of skin sensation 05/30/2013    Past Surgical History:  Procedure Laterality Date  . CESAREAN SECTION      OB History    No data available       Home Medications    Prior to Admission medications   Medication Sig Start Date End Date Taking? Authorizing Provider  amLODipine (NORVASC) 10 MG tablet Take 1 tablet (10 mg total) by mouth daily. 06/25/13   Micki Riley, MD  baclofen (LIORESAL) 10 MG tablet Take 0.5 tablets (5 mg total) by mouth 2 (two) times daily. 11/04/14   Clydia Llano, MD  ciprofloxacin (CIPRO) 500 MG  tablet Take 1 tablet (500 mg total) by mouth 2 (two) times daily. 11/04/14   Clydia Llano, MD  gabapentin (NEURONTIN) 100 MG capsule Take 200 mg by mouth 3 (three) times daily. 2 tablets bid 07/30/13   Levert Feinstein, MD  ibuprofen (ADVIL,MOTRIN) 200 MG tablet Take 400 mg by mouth every 6 (six) hours as needed (pain).    Historical Provider, MD  lansoprazole (PREVACID) 30 MG capsule Take 1 capsule (30 mg total) by mouth daily at 12 noon. Patient not taking: Reported on 11/03/2014 08/08/13   Benjiman Core, MD  lisinopril (PRINIVIL,ZESTRIL) 20 MG tablet Take 1 tablet (20 mg total) by mouth daily. 09/23/13   Micki Riley, MD  promethazine (PHENERGAN) 25 MG tablet Take 1 tablet (25 mg total) by mouth every 6 (six) hours as needed for nausea. Patient not taking: Reported on 11/03/2014 08/08/13   Benjiman Core, MD  traMADol (ULTRAM) 50 MG tablet Take 50 mg by mouth every 6 (six) hours as needed (pain). 05/30/13   Micki Riley, MD  venlafaxine (EFFEXOR) 75 MG tablet TAKE 1 TABLET (75 MG TOTAL) BY MOUTH DAILY.    Micki Riley, MD    Family History Family History  Problem Relation Age of Onset  . Lung cancer Father   . Diabetes Brother   . Heart attack Brother   . Hypertension Brother   . Hypertension Sister  Social History Social History  Substance Use Topics  . Smoking status: Current Every Day Smoker  . Smokeless tobacco: Never Used  . Alcohol use Yes     Allergies   Codeine and Sulfa antibiotics   Review of Systems Review of Systems  Constitutional: Positive for chills. Negative for fever.  HENT: Negative for ear pain and sore throat.   Eyes: Negative for pain and visual disturbance.  Respiratory: Negative for cough and shortness of breath.   Cardiovascular: Negative for chest pain and palpitations.  Gastrointestinal: Positive for abdominal pain (suprapubic). Negative for vomiting.  Genitourinary: Negative for dysuria and hematuria.  Musculoskeletal: Negative for arthralgias  and back pain.  Skin: Negative for color change and rash.  Neurological: Negative for seizures and syncope.  All other systems reviewed and are negative.    Physical Exam Updated Vital Signs BP (!) 127/115 (BP Location: Right Arm)   Pulse 76   Temp 98.6 F (37 C) (Oral)   Resp 17   Ht 5\' 1"  (1.549 m)   Wt 47.6 kg   SpO2 96%   BMI 19.84 kg/m   Physical Exam  Constitutional: She is oriented to person, place, and time. She appears well-developed and well-nourished.  HENT:  Head: Normocephalic and atraumatic.  Eyes: Conjunctivae and EOM are normal. Pupils are equal, round, and reactive to light.  Neck: Normal range of motion. Neck supple.  Cardiovascular: Normal rate and regular rhythm.   Pulmonary/Chest: Effort normal and breath sounds normal. No respiratory distress.  Abdominal: Soft. There is tenderness (suprapubic).  Musculoskeletal: She exhibits no edema.  Left arm is contracted and has decrease muscle mass compared with right.  Sensitive to palpation throughout, no point tenderness.  Neurological: She is alert and oriented to person, place, and time.  Skin: Skin is warm and dry.  Psychiatric: She has a normal mood and affect.  Nursing note and vitals reviewed.    ED Treatments / Results  Labs (all labs ordered are listed, but only abnormal results are displayed) Labs Reviewed  CBC WITH DIFFERENTIAL/PLATELET - Abnormal; Notable for the following:       Result Value   Hemoglobin 15.3 (*)    All other components within normal limits  COMPREHENSIVE METABOLIC PANEL - Abnormal; Notable for the following:    Potassium 3.4 (*)    AST 48 (*)    All other components within normal limits  URINALYSIS, ROUTINE W REFLEX MICROSCOPIC (NOT AT [facility]) - Abnormal; Notable for the following:    Specific Gravity, Urine 1.004 (*)    Hgb urine dipstick SMALL (*)    Leukocytes, UA TRACE (*)    All other components within normal limits  TSH - Abnormal; Notable for the following:    TSH  0.131 (*)    All other components within normal limits  URINE MICROSCOPIC-ADD ON - Abnormal; Notable for the following:    Squamous Epithelial / LPF 0-5 (*)    Bacteria, UA FEW (*)    All other components within normal limits  LIPASE, BLOOD  T4, FREE  I-STAT BETA HCG BLOOD, ED ([facility], [facility], [facility] ONLY)    EKG  EKG Interpretation  Date/Time:  Thursday December 24 2015 20:13:53 EDT Ventricular Rate:  74 PR Interval:    QRS Duration: 85 QT Interval:  417 QTC Calculation: 463 R Axis:   58 Text Interpretation:  Sinus rhythm Probable left atrial enlargement Artifact Abnormal ekg Confirmed by Gerhard Munch  MD ([id number]) on 12/24/2015 8:26:35 PM  Radiology Dg Chest 2 View  Result Date: 12/24/2015 CLINICAL DATA:  60 year old female with fatigue. Initial encounter. Smoker. EXAM: CHEST  2 VIEW COMPARISON:  CT Abdomen and Pelvis 08/08/2013. FINDINGS: One of the patient's arms was not raised on the lateral view. Normal cardiac size and mediastinal contours. Lung volumes are within normal limits. Visualized tracheal air column is within normal limits. No pneumothorax, pulmonary edema, pleural effusion or confluent pulmonary opacity. Mild increased interstitial markings diffusely, probably chronic. No acute osseous abnormality identified. IMPRESSION: No acute cardiopulmonary abnormality. Electronically Signed   By: Odessa FlemingH  Hall M.D.   On: 12/24/2015 21:39    Procedures Procedures (including critical care time)  Medications Ordered in ED Medications - No data to display   Initial Impression / Assessment and Plan / ED Course  I have reviewed the triage vital signs and the nursing notes.  Pertinent labs & imaging results that were available during my care of the patient were reviewed by me and considered in my medical decision making (see chart for details).  Clinical Course    Ms. Delanna NoticeDejong is a 60 year old female with PMH significant for fibromyalgia, depression, and stroke who presents for  fatigue and left arm paresthesias and pain.  She has been fatigued for months.  EKG obtained, demonstrates NSR with artifact. Doubt ACS.  Labs ordered, including Ua, thyroid studies, CBC, CMP, lipase, and b-hcg.  Results significant for mild hypokalemia and borderline liver enzymes.    CXR obtained, personally reviewed by me, demonstrates no acute cardic or pulmonary processes.  Patient was offered gabapentin for neurologic arm pain.  She declined stating that it has not worked in the past.  She was also offered a CT Head.  After discussing the risks and benefits with her, she elected not to have the study at this time.  The patient expresses that she feels the symptoms are likely a manifestation of her depression.  She has no PCP to manage her care at this time.  I will provide contact information to establish PCP and neurology care.  Patient is discharged with strict return precautions, follow up instructions and educational materials.  Final Clinical Impressions(s) / ED Diagnoses   Final diagnoses:  Fatigue, unspecified type  Peripheral polyneuropathy [facility](HCC)    New Prescriptions New Prescriptions   No medications on file     Garey HamMichael E Malori Myers, MD 12/24/15 2352    Gerhard Munchobert Lockwood, MD 12/30/15 2212

## 2015-12-24 NOTE — ED Notes (Signed)
Patient transported to X-ray 

## 2015-12-24 NOTE — ED Notes (Signed)
Pt does not wish to have CT. Pt requesting her IV to be removed and pt is wanting to find a water fountain, drink given

## 2015-12-24 NOTE — ED Triage Notes (Signed)
Per [facility]  Pt coming form home. Pt called someone to call 911 c/o lethargy and left arm pain/numbness. Lethargy has been going on for 2 days or 3-4 week. Arm pain for 2 days and the switches to 3-4 days. Started having numbness today and tenderness in the L arm. Decreased appetite. Back, buttock, and hip pain. Possible bladder infection because her urine smells "bad". Has a hx of stroke with L sided deficits. No weakness in grips noted.

## 2015-12-24 NOTE — ED Notes (Signed)
Pt states that the lethargy and left arm pain has been going on for a long time.  Pt requested to leave

## 2015-12-24 NOTE — ED Notes (Signed)
Patient transported to CT 

## 2016-02-15 ENCOUNTER — Encounter (HOSPITAL_COMMUNITY): Payer: Self-pay | Admitting: Emergency Medicine

## 2016-02-15 ENCOUNTER — Emergency Department (HOSPITAL_COMMUNITY)
Admission: EM | Admit: 2016-02-15 | Discharge: 2016-02-15 | Disposition: A | Discharge status: Home / Self Care | Payer: Medicare Other | Attending: Emergency Medicine | Admitting: Emergency Medicine

## 2016-02-15 ENCOUNTER — Emergency Department (HOSPITAL_COMMUNITY): Payer: Medicare Other

## 2016-02-15 DIAGNOSIS — R05 Cough: Secondary | ICD-10-CM | POA: Diagnosis present

## 2016-02-15 DIAGNOSIS — F17200 Nicotine dependence, unspecified, uncomplicated: Secondary | ICD-10-CM | POA: Insufficient documentation

## 2016-02-15 DIAGNOSIS — R0602 Shortness of breath: Secondary | ICD-10-CM | POA: Insufficient documentation

## 2016-02-15 DIAGNOSIS — Z8673 Personal history of transient ischemic attack (TIA), and cerebral infarction without residual deficits: Secondary | ICD-10-CM | POA: Diagnosis not present

## 2016-02-15 DIAGNOSIS — M792 Neuralgia and neuritis, unspecified: Secondary | ICD-10-CM | POA: Diagnosis not present

## 2016-02-15 DIAGNOSIS — Z79899 Other long term (current) drug therapy: Secondary | ICD-10-CM | POA: Insufficient documentation

## 2016-02-15 DIAGNOSIS — J069 Acute upper respiratory infection, unspecified: Secondary | ICD-10-CM

## 2016-02-15 DIAGNOSIS — Z7982 Long term (current) use of aspirin: Secondary | ICD-10-CM | POA: Diagnosis not present

## 2016-02-15 DIAGNOSIS — I10 Essential (primary) hypertension: Secondary | ICD-10-CM | POA: Diagnosis not present

## 2016-02-15 LAB — I-STAT TROPONIN, ED: Troponin i, poc: 0 ng/mL (ref 0.00–0.08)

## 2016-02-15 LAB — CBC
HCT: 44.1 % (ref 36.0–46.0)
Hemoglobin: 14.9 g/dL (ref 12.0–15.0)
MCH: 31.9 pg (ref 26.0–34.0)
MCHC: 33.8 g/dL (ref 30.0–36.0)
MCV: 94.4 fL (ref 78.0–100.0)
Platelets: 224 10*3/uL (ref 150–400)
RBC: 4.67 MIL/uL (ref 3.87–5.11)
RDW: 14.2 % (ref 11.5–15.5)
WBC: 7.8 10*3/uL (ref 4.0–10.5)

## 2016-02-15 LAB — BASIC METABOLIC PANEL
Anion gap: 9 (ref 5–15)
BUN: 18 mg/dL (ref 6–20)
CO2: 26 mmol/L (ref 22–32)
Calcium: 9.7 mg/dL (ref 8.9–10.3)
Chloride: 105 mmol/L (ref 101–111)
Creatinine, Ser: 0.63 mg/dL (ref 0.44–1.00)
GFR calc Af Amer: >60 mL/min (ref >60)
GFR calc non Af Amer: >60 mL/min (ref >60)
Glucose, Bld: 146 mg/dL — ABNORMAL HIGH (ref 65–99)
Potassium: 3.7 mmol/L (ref 3.5–5.1)
Sodium: 140 mmol/L (ref 135–145)

## 2016-02-15 LAB — BRAIN NATRIURETIC PEPTIDE: B Natriuretic Peptide: 16 pg/mL (ref 0.0–100.0)

## 2016-02-15 NOTE — Discharge Instructions (Signed)
Follow up with a primary care doctor, follow up with the neurologist as planned

## 2016-02-15 NOTE — ED Provider Notes (Signed)
[facility] DEPT Provider Note   CSN: [id number] Arrival date & time: 02/15/16  1446     History   Chief Complaint Chief Complaint  Patient presents with  . Arm Pain    HPI Carla Bowen is a 60 y.o. female.  HPI Pt presents to the ED initially complaining of feeling short of breath since yesterday, associated with a cough, nausea and vomiting and decreased PO intake.  She has been coughing up mucus.  She has not measured a temp but does not know if she has any fevers.  She denies any leg swelling.  No history of PE, MI or BNP.  As I asked her more about her breathing issues she states she is really here because of her arm pain.  She has had this pain ever since she had a stroke several years ago.   The pain is on her left face and left arm.  It seems to come and go.  She has a primary care doctor but does not see an neurologist.  She states she moved to this area back in August.  Past Medical History:  Diagnosis Date  . Fibromyalgia   . High cholesterol   . Hypertension   . Stroke [facility](HCC)     Patient Active Problem List   Diagnosis Date Noted  . Chest tightness   . History of stroke   . Left hemiparesis (HCC)   . Stroke (HCC)   . Chest pain 11/03/2014  . UTI (lower urinary tract infection) 11/03/2014  . HTN (hypertension) 11/03/2014  . ICH (intracerebral hemorrhage) (HCC) 05/30/2013  . Spastic hemiplegia affecting nondominant side (HCC) 05/30/2013  . Abnormality of gait 05/30/2013  . 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 Disturbance of skin sensation 05/30/2013    Past Surgical History:  Procedure Laterality Date  . CESAREAN SECTION      OB History    No data available       Home Medications    Prior to Admission medications   Medication Sig Start Date End Date Taking? Authorizing Provider  amLODipine (NORVASC) 10 MG tablet Take 1 tablet (10 mg total) by mouth daily. 06/25/13   Micki RileyPramod S Sethi, MD  aspirin EC 81 MG tablet Take 81 mg by mouth daily.     Historical Provider, MD  baclofen (LIORESAL) 10 MG tablet Take 0.5 tablets (5 mg total) by mouth 2 (two) times daily. Patient not taking: Reported on 12/24/2015 11/04/14   Clydia LlanoMutaz Elmahi, MD  ciprofloxacin (CIPRO) 500 MG tablet Take 1 tablet (500 mg total) by mouth 2 (two) times daily. Patient not taking: Reported on 12/24/2015 11/04/14   Clydia LlanoMutaz Elmahi, MD  cycloSPORINE (RESTASIS) 0.05 % ophthalmic emulsion Place 1 drop into both eyes 2 (two) times daily.    Historical Provider, MD  DULoxetine (CYMBALTA) 60 MG capsule Take 60 mg by mouth daily.    Historical Provider, MD  ibuprofen (ADVIL,MOTRIN) 200 MG tablet Take 400 mg by mouth every 6 (six) hours as needed for moderate pain.    Historical Provider, MD  lansoprazole (PREVACID) 30 MG capsule Take 1 capsule (30 mg total) by mouth daily at 12 noon. Patient not taking: Reported on 12/24/2015 08/08/13   Benjiman CoreNathan Pickering, MD  lisinopril (PRINIVIL,ZESTRIL) 20 MG tablet Take 1 tablet (20 mg total) by mouth daily. Patient not taking: Reported on 12/24/2015 09/23/13   Micki RileyPramod S Sethi, MD  lisinopril (PRINIVIL,ZESTRIL) 40 MG tablet Take 40 mg by mouth daily.    Historical Provider, MD  promethazine (  PHENERGAN) 25 MG tablet Take 1 tablet (25 mg total) by mouth every 6 (six) hours as needed for nausea. Patient not taking: Reported on 12/24/2015 08/08/13   Benjiman CoreNathan Pickering, MD  traMADol (ULTRAM) 50 MG tablet Take 50 mg by mouth every 6 (six) hours as needed (pain). 05/30/13   Micki RileyPramod S Sethi, MD  venlafaxine (EFFEXOR) 75 MG tablet TAKE 1 TABLET (75 MG TOTAL) BY MOUTH DAILY. Patient not taking: Reported on 12/24/2015    Micki RileyPramod S Sethi, MD    Family History Family History  Problem Relation Age of Onset  . Lung cancer Father   . Diabetes Brother   . Heart attack Brother   . Hypertension Brother   . Hypertension Sister     Social History Social History  Substance Use Topics  . Smoking status: Current Every Day Smoker  . Smokeless tobacco: Never Used  .  Alcohol use Yes     Allergies   Ciprofloxacin; Codeine; and Sulfa antibiotics   Review of Systems Review of Systems  All other systems reviewed and are negative.    Physical Exam Updated Vital Signs BP (!) 175/111 (BP Location: Right Arm)   Pulse 91   Temp 98.5 F (36.9 C)   Resp 24   LMP  (LMP Unknown)   SpO2 92%   Physical Exam  Constitutional: She appears well-developed and well-nourished. No distress.  HENT:  Head: Normocephalic and atraumatic.  Right Ear: External ear normal.  Left Ear: External ear normal.  Eyes: Conjunctivae are normal. Right eye exhibits no discharge. Left eye exhibits no discharge. No scleral icterus.  Neck: Neck supple. No tracheal deviation present.  Cardiovascular: Normal rate, regular rhythm and intact distal pulses.   Pulmonary/Chest: Effort normal and breath sounds normal. No stridor. No respiratory distress. She has no wheezes. She has no rales.  Abdominal: Soft. Bowel sounds are normal. She exhibits no distension. There is no tenderness. There is no rebound and no guarding.  Musculoskeletal: She exhibits no edema or tenderness.  Neurological: She is alert. She has normal strength. No cranial nerve deficit (no facial droop, extraocular movements intact, no slurred speech) or sensory deficit. She exhibits normal muscle tone. She displays no seizure activity.  Spasticity LUE and LLE, equal grip strength  Skin: Skin is warm and dry. No rash noted.  Psychiatric: She has a normal mood and affect.  Nursing note and vitals reviewed.    ED Treatments / Results  Labs (all labs ordered are listed, but only abnormal results are displayed) Labs Reviewed  BASIC METABOLIC PANEL - Abnormal; Notable for the following:       Result Value   Glucose, Bld 146 (*)    All other components within normal limits  CBC  BRAIN NATRIURETIC PEPTIDE  I-STAT TROPOININ, ED    EKG  EKG Interpretation  Date/Time:  Monday February 15 2016 14:57:06  EST Ventricular Rate:  90 PR Interval:  124 QRS Duration: 82 QT Interval:  394 QTC Calculation: 481 R Axis:   62 Text Interpretation:  Normal sinus rhythm RSR' or QR pattern in V1 suggests right ventricular conduction delay ST abnormality, possible digitalis effect Prolonged QT , increased since last tracing Abnormal ECG Confirmed by Buelah Rennie  MD-J, Moises Terpstra ([id number]) on 02/15/2016 3:20:40 PM Also confirmed by [facility]  MD-J, Agustina Witzke [phone]), editor [place]LOGAN, Cala BradfordKIMBERLY [phone])  on 02/15/2016 3:27:35 PM       Radiology Dg Chest 2 View  Result Date: 02/15/2016 CLINICAL DATA:  Left-sided pain. EXAM: CHEST  2  VIEW COMPARISON:  12/24/2015. FINDINGS: Mediastinum and hilar structures normal. Nodular density noted over the left lung base most likely represents nipple shadow. Repeat PA and lateral chest x-ray with nipple markers suggested. No pleural effusion or pneumothorax. Heart size normal . No pleural effusion or pneumothorax. No pleural effusion or pneumothorax. IMPRESSION: Nodular density noted left lung base, most likely nipple shadow. Repeat PA lateral chest x-ray with nipple markers suggested. Exam otherwise unremarkable. Electronically Signed   By: Maisie Fus  Register   On: 02/15/2016 15:58    Procedures Procedures (including critical care time)  Medications Ordered in ED Medications - No data to display   Initial Impression / Assessment and Plan / ED Course  I have reviewed the triage vital signs and the nursing notes.  Pertinent labs & imaging results that were available during my care of the patient were reviewed by me and considered in my medical decision making (see chart for details).  Clinical Course     The patient's laboratory tests and imaging tests are reassuring. No evidence of pneumonia, congestive heart failure, dehydration or serious infection.  The patient's arm pain is chronic. There is documentation in her medical record in the past about the chronic pain in her left arm ever since her  stroke. Patient is taking Neurontin as well as other medications. I recommend she follow up with her neurologist or her pain management doctor. Patient is feeling well and is ready to go home  Final Clinical Impressions(s) / ED Diagnoses   Final diagnoses:  Upper respiratory tract infection, unspecified type  Neuropathic pain, arm    New Prescriptions New Prescriptions   No medications on file     Linwood Dibbles, MD 02/15/16 1747

## 2016-02-15 NOTE — ED Notes (Signed)
Pt is in stable condition upon d/c and ambulates from ED. Pt was given a sandwhich bag and a sprite while she waits for her ride.

## 2016-02-15 NOTE — ED Notes (Signed)
Pt is requesting to have IV taken out.

## 2016-02-15 NOTE — ED Triage Notes (Signed)
Per [facility] patient complaining of left sided pain that she describes at "tightness"  that started yesterday.  Patient has a history of left sided tingling and numbness that started with a stroke in 2014, states this sensation has gotten more intense in the left arm.  Patient is alert and oriented at this time.  Patient also complains of nausea, received 4mg  zofran from EMS PTA.  20g IV saline lock in right hand.

## 2016-02-15 NOTE — ED Notes (Signed)
Patient transported to X-ray 

## 2016-04-08 ENCOUNTER — Ambulatory Visit: Payer: Self-pay | Admitting: Neurology

## 2016-04-12 ENCOUNTER — Emergency Department (HOSPITAL_COMMUNITY)
Admission: EM | Admit: 2016-04-12 | Discharge: 2016-04-13 | Disposition: A | Discharge status: Home / Self Care | Payer: Medicare Other | Attending: Emergency Medicine | Admitting: Emergency Medicine

## 2016-04-12 DIAGNOSIS — F17200 Nicotine dependence, unspecified, uncomplicated: Secondary | ICD-10-CM | POA: Diagnosis not present

## 2016-04-12 DIAGNOSIS — Z9889 Other specified postprocedural states: Secondary | ICD-10-CM | POA: Diagnosis not present

## 2016-04-12 DIAGNOSIS — E876 Hypokalemia: Secondary | ICD-10-CM | POA: Insufficient documentation

## 2016-04-12 DIAGNOSIS — Z8673 Personal history of transient ischemic attack (TIA), and cerebral infarction without residual deficits: Secondary | ICD-10-CM | POA: Insufficient documentation

## 2016-04-12 DIAGNOSIS — F4321 Adjustment disorder with depressed mood: Secondary | ICD-10-CM

## 2016-04-12 DIAGNOSIS — I10 Essential (primary) hypertension: Secondary | ICD-10-CM | POA: Diagnosis not present

## 2016-04-12 DIAGNOSIS — R780 Finding of alcohol in blood: Secondary | ICD-10-CM

## 2016-04-12 DIAGNOSIS — Z801 Family history of malignant neoplasm of trachea, bronchus and lung: Secondary | ICD-10-CM | POA: Diagnosis not present

## 2016-04-12 DIAGNOSIS — T424X2A Poisoning by benzodiazepines, intentional self-harm, initial encounter: Secondary | ICD-10-CM | POA: Insufficient documentation

## 2016-04-12 DIAGNOSIS — F329 Major depressive disorder, single episode, unspecified: Secondary | ICD-10-CM | POA: Diagnosis not present

## 2016-04-12 DIAGNOSIS — F4329 Adjustment disorder with other symptoms: Secondary | ICD-10-CM | POA: Diagnosis present

## 2016-04-12 DIAGNOSIS — Z79899 Other long term (current) drug therapy: Secondary | ICD-10-CM | POA: Diagnosis not present

## 2016-04-12 DIAGNOSIS — F1010 Alcohol abuse, uncomplicated: Secondary | ICD-10-CM | POA: Diagnosis not present

## 2016-04-12 DIAGNOSIS — F32A Depression, unspecified: Secondary | ICD-10-CM

## 2016-04-12 LAB — COMPREHENSIVE METABOLIC PANEL
ALT: 27 U/L (ref 14–54)
AST: 39 U/L (ref 15–41)
Albumin: 3.8 g/dL (ref 3.5–5.0)
Alkaline Phosphatase: 83 U/L (ref 38–126)
Anion gap: 11 (ref 5–15)
BUN: 13 mg/dL (ref 6–20)
CO2: 27 mmol/L (ref 22–32)
Calcium: 8.4 mg/dL — ABNORMAL LOW (ref 8.9–10.3)
Chloride: 102 mmol/L (ref 101–111)
Creatinine, Ser: 0.5 mg/dL (ref 0.44–1.00)
GFR calc Af Amer: >60 mL/min (ref >60)
GFR calc non Af Amer: >60 mL/min (ref >60)
Glucose, Bld: 83 mg/dL (ref 65–99)
Potassium: 2.7 mmol/L — CL (ref 3.5–5.1)
Sodium: 140 mmol/L (ref 135–145)
Total Bilirubin: 0.5 mg/dL (ref 0.3–1.2)
Total Protein: 6.7 g/dL (ref 6.5–8.1)

## 2016-04-12 LAB — CBC
HCT: 40.4 % (ref 36.0–46.0)
Hemoglobin: 13.6 g/dL (ref 12.0–15.0)
MCH: 31.6 pg (ref 26.0–34.0)
MCHC: 33.7 g/dL (ref 30.0–36.0)
MCV: 93.7 fL (ref 78.0–100.0)
Platelets: 208 10*3/uL (ref 150–400)
RBC: 4.31 MIL/uL (ref 3.87–5.11)
RDW: 13.4 % (ref 11.5–15.5)
WBC: 5.6 10*3/uL (ref 4.0–10.5)

## 2016-04-12 LAB — SALICYLATE LEVEL: Salicylate Lvl: <7 mg/dL (ref 2.8–30.0)

## 2016-04-12 LAB — RAPID URINE DRUG SCREEN, HOSP PERFORMED
Amphetamines: NOT DETECTED
Barbiturates: NOT DETECTED
Benzodiazepines: NOT DETECTED
Cocaine: NOT DETECTED
Opiates: NOT DETECTED
Tetrahydrocannabinol: NOT DETECTED

## 2016-04-12 LAB — ACETAMINOPHEN LEVEL: Acetaminophen (Tylenol), Serum: <10 ug/mL — ABNORMAL LOW (ref 10–30)

## 2016-04-12 LAB — ETHANOL: Alcohol, Ethyl (B): 150 mg/dL — ABNORMAL HIGH (ref <5)

## 2016-04-12 LAB — CBG MONITORING, ED: Glucose-Capillary: 120 mg/dL — ABNORMAL HIGH (ref 65–99)

## 2016-04-12 MED ORDER — POTASSIUM CHLORIDE CRYS ER 20 MEQ PO TBCR
40.0000 meq | EXTENDED_RELEASE_TABLET | Freq: Once | ORAL | Status: AC
  Administered 2016-04-13: 40 meq via ORAL
  Filled 2016-04-12: qty 2

## 2016-04-12 NOTE — ED Triage Notes (Addendum)
Per EMS patient brought in for patient was having breathing difficulty and having cough with phlegm as to why they were called out to scene.  Family told EMS that patient has been having depression x 5 months and took 2 whole Klonopin 1mg  each around 3pm and half one little while ago.  Patient reports that she is only supposed to take 0.5mg  Klonopin as needed.  Patient's family wanted her evaluated for depression.  Patient had previous CVA

## 2016-04-12 NOTE — BH Assessment (Addendum)
Tele Assessment Note   Carla Bowen is a 61 y.o. female who presented voluntarily to [facility]. Pt states she had some trouble breathing at home earlier today and her son got concerned and called EMS. Pt states she is only depressed in regards to ongoing chronic pain that was left from having a stroke last year. Pt denies S/I, H/I, AV hallucinations and self-injurious behaviors. Pt states she is not on medications for depression nor is seeing anybody for outpatient therapy.   Pt states her primary stressors is the chronic pain in on the left side of her body. She states "the pain is unbearable and none of my doctors seem to know what the right medications I  should be taking or can have."  Pt contributes are all depressive symptoms to her stroke.  Pt states she is safe to return home and denies having access to weapons.  Pt also stated that she separated from her husband late last  Year and now is leaving with her son, his girlfriend, and his four children. Pt states they are getting ready to move out so her living situation will become more relaxing.   Pt was dressed in hospital scrubs. Pt was alert and oriented x4. Pt had good eye contact. Pt's mood was depressed and irritated. Pt was not responding to any internal stimuli. Pt's thought process was logical and coherent.   Diagnosis: Adjustment Disorder   Past Medical History:  Past Medical History:  Diagnosis Date  . Fibromyalgia   . High cholesterol   . Hypertension   . Stroke [facility])     Past Surgical History:  Procedure Laterality Date  . CESAREAN SECTION      Family History:  Family History  Problem Relation Age of Onset  . Lung cancer Father   . Diabetes Brother   . Heart attack Brother   . Hypertension Brother   . Hypertension Sister     Social History:  reports that she has been smoking.  She has never used smokeless tobacco. She reports that she drinks alcohol. She reports that she does not use drugs.  Additional Social  History:  Alcohol / Drug Use Pain Medications: see MAR Prescriptions: see MAR Over the Counter: see MAR  History of alcohol / drug use?: No history of alcohol / drug abuse  CIWA: CIWA-Ar BP: 149/91 Pulse Rate: 82 COWS:    PATIENT STRENGTHS: (choose at least two) Ability for insight Average or above average intelligence Capable of independent living Communication skills General fund of knowledge Motivation for treatment/growth Physical Health  Allergies:  Allergies  Allergen Reactions  . Ciprofloxacin Itching, Nausea And Vomiting and Rash  . Codeine Hives  . Sulfa Antibiotics Hives    Home Medications:  (Not in a hospital admission)  OB/GYN Status:  No LMP recorded (lmp unknown). Patient is postmenopausal.  General Assessment Data Location of Assessment: [facility] ED TTS Assessment: In system Is this a Tele or Face-to-Face Assessment?: Face-to-Face Is this an Initial Assessment or a Re-assessment for this encounter?: Initial Assessment Marital status: Separated Is patient pregnant?: No Pregnancy Status: No Living Arrangements: Children, Alone Can pt return to current living arrangement?: Yes Admission Status: Voluntary Is patient capable of signing voluntary admission?: Yes Referral Source: Self/Family/Friend Insurance type: [place] [facility]   Medical Screening Exam [facility] Walk-in ONLY) Medical Exam completed: Yes  Crisis Care Plan Living Arrangements: Children, Alone Name of Psychiatrist: none Name of Therapist: none  Education Status Is patient currently in school?: No Current  Grade: na Highest grade of school patient has completed: na Name of school: na Contact person: na  Risk to self with the past 6 months Suicidal Ideation: No Has patient been a risk to self within the past 6 months prior to admission? : No Suicidal Intent: No Has patient had any suicidal intent within the past 6 months prior to admission? : Yes Is patient at risk for suicide?:  No Suicidal Plan?: No Has patient had any suicidal plan within the past 6 months prior to admission? : No Access to Means: No What has been your use of drugs/alcohol within the last 12 months?: denies Previous Attempts/Gestures: No How many times?: 0 Other Self Harm Risks: denies Triggers for Past Attempts: None known Intentional Self Injurious Behavior: None Family Suicide History: Unknown Recent stressful life event(s): Divorce, Trauma (Comment) Persecutory voices/beliefs?: No Depression: Yes Depression Symptoms: Feeling worthless/self pity, Fatigue, Tearfulness Substance abuse history and/or treatment for substance abuse?: No Suicide prevention information given to non-admitted patients: Not applicable  Risk to Others within the past 6 months Homicidal Ideation: No Does patient have any lifetime risk of violence toward others beyond the six months prior to admission? : No Thoughts of Harm to Others: No Current Homicidal Intent: No Current Homicidal Plan: No Access to Homicidal Means: No Identified Victim: na History of harm to others?: No Assessment of Violence: None Noted Violent Behavior Description: na Does patient have access to weapons?: No Criminal Charges Pending?: No Does patient have a court date: No Is patient on probation?: No  Psychosis Hallucinations: None noted Delusions: None noted  Mental Status Report Appearance/Hygiene: In scrubs Eye Contact: Good Motor Activity: Freedom of movement Speech: Logical/coherent Level of Consciousness: Alert Mood: Depressed, Irritable Affect: Depressed Anxiety Level: None Thought Processes: Coherent Judgement: Unimpaired Orientation: Person, Place, Time, Situation, Appropriate for developmental age Obsessive Compulsive Thoughts/Behaviors: None  Cognitive Functioning Concentration: Normal Memory: Recent Intact IQ: Average Insight: Good Impulse Control: Good Appetite: Fair Weight Loss: 0 Weight Gain: 0 Sleep:  No Change Total Hours of Sleep: 6 Vegetative Symptoms: None  ADLScreening [facility] Assessment Services) Patient's cognitive ability adequate to safely complete daily activities?: Yes  Prior Inpatient Therapy Prior Inpatient Therapy: No Prior Therapy Dates: na Prior Therapy Facilty/Provider(s): na Reason for Treatment: na  Prior Outpatient Therapy Prior Outpatient Therapy: No Prior Therapy Dates: na Prior Therapy Facilty/Provider(s): na Reason for Treatment: na Does patient have an ACCT team?: No Does patient have Intensive In-House Services?  : No Does patient have [facility] services? : No Does patient have P4CC services?: No  ADL Screening (condition at time of admission) Patient's cognitive ability adequate to safely complete daily activities?: Yes Is the patient deaf or have difficulty hearing?: No Does the patient have difficulty seeing, even when wearing glasses/contacts?: No Does the patient have difficulty concentrating, remembering, or making decisions?: No Does the patient have difficulty dressing or bathing?: No Does the patient have difficulty walking or climbing stairs?: Yes Weakness of Legs: Left Weakness of Arms/Hands: Left       Abuse/Neglect Assessment (Assessment to be complete while patient is alone) Physical Abuse: Denies Verbal Abuse: Denies Sexual Abuse: Denies Exploitation of patient/patient's resources: Denies Self-Neglect: Denies Values / Beliefs Cultural Requests During Hospitalization: None Spiritual Requests During Hospitalization: None   Advance Directives (For Healthcare) Does Patient Have a Medical Advance Directive?: No Would patient like information on creating a medical advance directive?: No - Patient declined    Additional Information 1:1 In Past 12 Months?: No CIRT Risk:  No Elopement Risk: No Does patient have medical clearance?: Yes     Disposition: Gave clinical report to Donell SievertSpencer Simon, PA who states pt does not meet inpatient  criteria and can be discharged with outpatient resources.     Morrie Sheldonshley n Arie SabinaBlanton 04/12/2016 10:28 PM

## 2016-04-12 NOTE — ED Triage Notes (Addendum)
Patient states that she is here due to her depression.  Patient states that she has thoughts that she was dead but denies any plan or being able to harm herself.  Patient denies HI. Patient states that since the stroke she has numbness and tingling on left side of face, dental pain, left arm and left hip pain that continues to get worse. Patient states that she sees neurologist and they aren't able to get pain uncontrol as it just increases every year, as to why she wants to die.

## 2016-04-12 NOTE — BHH Counselor (Signed)
Notified RN that pt does not meet inpatient criteria and can be discharged with outpatient resources. Pt currently does not have transportation home but options will be discussed.   Orlie PollenAshley Evadne Ose, LPC, NCC,  LCAS-A Therapeutic Triage Specialist  04/12/2016 10:56 PM

## 2016-04-12 NOTE — ED Notes (Addendum)
Called Poison control regarding ingestion spoke with Patty:   States that patient could experience drowsiness.   Recommend: Monitor at least 6 hours from time of ingestion, obtaining an Acetaminophen and salicylate level as well as an EKG.

## 2016-04-13 DIAGNOSIS — Z833 Family history of diabetes mellitus: Secondary | ICD-10-CM

## 2016-04-13 DIAGNOSIS — Z882 Allergy status to sulfonamides status: Secondary | ICD-10-CM

## 2016-04-13 DIAGNOSIS — F4321 Adjustment disorder with depressed mood: Secondary | ICD-10-CM

## 2016-04-13 DIAGNOSIS — Z801 Family history of malignant neoplasm of trachea, bronchus and lung: Secondary | ICD-10-CM

## 2016-04-13 DIAGNOSIS — F4329 Adjustment disorder with other symptoms: Secondary | ICD-10-CM | POA: Diagnosis present

## 2016-04-13 DIAGNOSIS — F1010 Alcohol abuse, uncomplicated: Secondary | ICD-10-CM | POA: Diagnosis not present

## 2016-04-13 DIAGNOSIS — Z79899 Other long term (current) drug therapy: Secondary | ICD-10-CM

## 2016-04-13 DIAGNOSIS — Z8249 Family history of ischemic heart disease and other diseases of the circulatory system: Secondary | ICD-10-CM

## 2016-04-13 DIAGNOSIS — F17210 Nicotine dependence, cigarettes, uncomplicated: Secondary | ICD-10-CM

## 2016-04-13 DIAGNOSIS — Z9889 Other specified postprocedural states: Secondary | ICD-10-CM

## 2016-04-13 DIAGNOSIS — Z888 Allergy status to other drugs, medicaments and biological substances status: Secondary | ICD-10-CM

## 2016-04-13 MED ORDER — ACETAMINOPHEN 325 MG PO TABS
650.0000 mg | ORAL_TABLET | Freq: Once | ORAL | Status: DC
  Filled 2016-04-13: qty 2

## 2016-04-13 MED ORDER — ACETAMINOPHEN 500 MG PO TABS
1000.0000 mg | ORAL_TABLET | Freq: Once | ORAL | Status: AC
Start: 2016-04-13 — End: 2016-04-13
  Administered 2016-04-13: 1000 mg via ORAL
  Filled 2016-04-13: qty 2

## 2016-04-13 NOTE — Consult Note (Signed)
[facility] Face-to-Face Psychiatry Consult   Reason for Consult:  Alcohol abuse with pain and desire to die Referring Physician:  EDP Patient Identification: Carla Bowen MRN:  [id number] Principal Diagnosis: Adjustment disorder with depressed mood Diagnosis:   Patient Active Problem List   Diagnosis Date Noted  . Adjustment disorder with depressed mood [F43.21] 04/13/2016    Priority: High  . Alcohol abuse [F10.10] 04/13/2016    Priority: High  . Chest tightness [R07.89]   . History of stroke [Z86.73]   . Left hemiparesis (HCC) [G81.94]   . Stroke (HCC) [I63.9]   . Chest pain [R07.9] 11/03/2014  . UTI (lower urinary tract infection) [N39.0] 11/03/2014  . HTN (hypertension) [I10] 11/03/2014  . ICH (intracerebral hemorrhage) (HCC) [I61.9] 05/30/2013  . 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 Abnormality of gait [R26.9] 05/30/2013  . 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 Disturbance of skin sensation [R20.9] 05/30/2013    Total Time spent with patient: 45 minutes  Subjective:   Carla Bowen is a 60 y.o. female patient does not warrant admission.  HPI:  60 yo female who came to the ED for pain (recent stroke) while in the ED she stated the pain was making her want to die.  She was also positive for alcohol and stated she had "bad thoughts."  Denies these thoughts today, no suicidal/homicidal ideations, hallucinations, and withdrawal symptoms.  Carla Bowen wants to leave as her son and girlfriend with their four children are leaving her house after staying for awhile.  She rates this as a positive.  Carla Bowen does not want any help for her alcohol abuse, pint every other day, "I can quit on my own and can do it again."  No desire for rehab, agreeable for therapy and already has contacted a facility.  Stable for discharge.  Past Psychiatric History: alcohol abuse  Risk to Self: Suicidal Ideation: No Suicidal Intent: No Is patient at risk for suicide?:  No Suicidal Plan?: No Access to Means: No What has been your use of drugs/alcohol within the last 12 months?: denies How many times?: 0 Other Self Harm Risks: denies Triggers for Past Attempts: None known Intentional Self Injurious Behavior: None Risk to Others: Homicidal Ideation: No Thoughts of Harm to Others: No Current Homicidal Intent: No Current Homicidal Plan: No Access to Homicidal Means: No Identified Victim: na History of harm to others?: No Assessment of Violence: None Noted Violent Behavior Description: na Does patient have access to weapons?: No Criminal Charges Pending?: No Does patient have a court date: No Prior Inpatient Therapy: Prior Inpatient Therapy: No Prior Therapy Dates: na Prior Therapy Facilty/Provider(s): na Reason for Treatment: na Prior Outpatient Therapy: Prior Outpatient Therapy: No Prior Therapy Dates: na Prior Therapy Facilty/Provider(s): na Reason for Treatment: na Does patient have an ACCT team?: No Does patient have Intensive In-House Services?  : No Does patient have [facility] services? : No Does patient have P4CC services?: No  Past Medical History:  Past Medical History:  Diagnosis Date  . Fibromyalgia   . High cholesterol   . Hypertension   . Stroke (HCC)     Past Surgical History:  Procedure Laterality Date  . CESAREAN SECTION     Family History:  Family History  Problem Relation Age of Onset  . Lung cancer Father   . Diabetes Brother   . Heart attack Brother   . Hypertension Brother   . Hypertension Sister    Family Psychiatric  History: none Social   History:  History  Alcohol Use  . Yes     History  Drug Use No    Social History   Social History  . Marital status: Divorced    Spouse name: N/A  . Number of children: 1  . Years of education: N/A   Occupational History  . Disablity     Social History Main Topics  . Smoking status: Current Every Day Smoker  . Smokeless tobacco: Never Used  . Alcohol use  Yes  . Drug use: No  . Sexual activity: Not on file   Other Topics Concern  . Not on file   Social History Narrative   Patient is right handed and lives at home with family.Patient drinks caffinated drinks.   Additional Social History:    Allergies:   Allergies  Allergen Reactions  . Ciprofloxacin Itching, Nausea And Vomiting and Rash  . Codeine Hives  . Sulfa Antibiotics Hives    Labs:  Results for orders placed or performed during the hospital encounter of 04/12/16 (from the past 48 hour(s))  Rapid urine drug screen (hospital performed)     Status: None   Collection Time: 04/12/16  6:38 PM  Result Value Ref Range   Opiates NONE DETECTED NONE DETECTED   Cocaine NONE DETECTED NONE DETECTED   Benzodiazepines NONE DETECTED NONE DETECTED   Amphetamines NONE DETECTED NONE DETECTED   Tetrahydrocannabinol NONE DETECTED NONE DETECTED   Barbiturates NONE DETECTED NONE DETECTED    Comment:        DRUG SCREEN FOR MEDICAL PURPOSES ONLY.  IF CONFIRMATION IS NEEDED FOR ANY PURPOSE, NOTIFY LAB WITHIN 5 DAYS.        LOWEST DETECTABLE LIMITS FOR URINE DRUG SCREEN Drug Class       Cutoff (ng/mL) Amphetamine      1000 Barbiturate      200 Benzodiazepine   109 Tricyclics       323 Opiates          300 Cocaine          300 THC              50   Comprehensive metabolic panel     Status: Abnormal   Collection Time: 04/12/16  7:08 PM  Result Value Ref Range   Sodium 140 135 - 145 mmol/L   Potassium 2.7 (LL) 3.5 - 5.1 mmol/L    Comment: CRITICAL RESULT CALLED TO, READ BACK BY AND VERIFIED WITH: LISA.ED AT 1946 ON 04/12/2016 BY MOSLEY,J    Chloride 102 101 - 111 mmol/L   CO2 27 22 - 32 mmol/L   Glucose, Bld 83 65 - 99 mg/dL   BUN 13 6 - 20 mg/dL   Creatinine, Ser 0.50 0.44 - 1.00 mg/dL   Calcium 8.4 (L) 8.9 - 10.3 mg/dL   Total Protein 6.7 6.5 - 8.1 g/dL   Albumin 3.8 3.5 - 5.0 g/dL   AST 39 15 - 41 U/L   ALT 27 14 - 54 U/L   Alkaline Phosphatase 83 38 - 126 U/L   Total  Bilirubin 0.5 0.3 - 1.2 mg/dL   GFR calc non Af Amer >60 >60 mL/min   GFR calc Af Amer >60 >60 mL/min    Comment: (NOTE) The eGFR has been calculated using the CKD EPI equation. This calculation has not been validated in all clinical situations. eGFR's persistently <60 mL/min signify possible Chronic Kidney Disease.    Anion gap 11 5 - 15  Ethanol  Status: Abnormal   Collection Time: 04/12/16  7:08 PM  Result Value Ref Range   Alcohol, Ethyl (B) 150 (H) <5 mg/dL    Comment:        LOWEST DETECTABLE LIMIT FOR SERUM ALCOHOL IS 5 mg/dL FOR MEDICAL PURPOSES ONLY   Salicylate level     Status: None   Collection Time: 04/12/16  7:08 PM  Result Value Ref Range   Salicylate Lvl <3.5 2.8 - 30.0 mg/dL  Acetaminophen level     Status: Abnormal   Collection Time: 04/12/16  7:08 PM  Result Value Ref Range   Acetaminophen (Tylenol), Serum <10 (L) 10 - 30 ug/mL    Comment:        THERAPEUTIC CONCENTRATIONS VARY SIGNIFICANTLY. A RANGE OF 10-30 ug/mL MAY BE AN EFFECTIVE CONCENTRATION FOR MANY PATIENTS. HOWEVER, SOME ARE BEST TREATED AT CONCENTRATIONS OUTSIDE THIS RANGE. ACETAMINOPHEN CONCENTRATIONS >150 ug/mL AT 4 HOURS AFTER INGESTION AND >50 ug/mL AT 12 HOURS AFTER INGESTION ARE OFTEN ASSOCIATED WITH TOXIC REACTIONS.   cbc     Status: None   Collection Time: 04/12/16  7:08 PM  Result Value Ref Range   WBC 5.6 4.0 - 10.5 K/uL   RBC 4.31 3.87 - 5.11 MIL/uL   Hemoglobin 13.6 12.0 - 15.0 g/dL   HCT 40.4 36.0 - 46.0 %   MCV 93.7 78.0 - 100.0 fL   MCH 31.6 26.0 - 34.0 pg   MCHC 33.7 30.0 - 36.0 g/dL   RDW 13.4 11.5 - 15.5 %   Platelets 208 150 - 400 K/uL  CBG monitoring, ED     Status: Abnormal   Collection Time: 04/12/16  8:36 PM  Result Value Ref Range   Glucose-Capillary 120 (H) 65 - 99 mg/dL    Current Facility-Administered Medications  Medication Dose Route Frequency Provider Last Rate Last Dose  . acetaminophen (TYLENOL) tablet 650 mg  650 mg Oral Once Nat Christen, MD        Current Outpatient Prescriptions  Medication Sig Dispense Refill  . amLODipine (NORVASC) 10 MG tablet Take 1 tablet (10 mg total) by mouth daily. 90 tablet 0  . clonazePAM (KLONOPIN) 0.5 MG tablet Take 1 tablet by mouth 2 (two) times daily.    . cycloSPORINE (RESTASIS) 0.05 % ophthalmic emulsion Place 1 drop into both eyes 2 (two) times daily.    . DULoxetine (CYMBALTA) 60 MG capsule Take 60 mg by mouth daily.    Marland Kitchen ibuprofen (ADVIL,MOTRIN) 200 MG tablet Take 400 mg by mouth every 6 (six) hours as needed for moderate pain.    Marland Kitchen lisinopril (PRINIVIL,ZESTRIL) 40 MG tablet Take 40 mg by mouth daily.    . rosuvastatin (CRESTOR) 20 MG tablet Take 20 mg by mouth daily.    . baclofen (LIORESAL) 10 MG tablet Take 0.5 tablets (5 mg total) by mouth 2 (two) times daily. (Patient not taking: Reported on 12/24/2015) 60 each 0  . ciprofloxacin (CIPRO) 500 MG tablet Take 1 tablet (500 mg total) by mouth 2 (two) times daily. (Patient not taking: Reported on 12/24/2015) 6 tablet 0  . lansoprazole (PREVACID) 30 MG capsule Take 1 capsule (30 mg total) by mouth daily at 12 noon. (Patient not taking: Reported on 12/24/2015) 14 capsule 0  . lisinopril (PRINIVIL,ZESTRIL) 20 MG tablet Take 1 tablet (20 mg total) by mouth daily. (Patient not taking: Reported on 12/24/2015) 30 tablet 0  . promethazine (PHENERGAN) 25 MG tablet Take 1 tablet (25 mg total) by mouth every 6 (six) hours as  needed for nausea. (Patient not taking: Reported on 12/24/2015) 10 tablet 0  . venlafaxine (EFFEXOR) 75 MG tablet TAKE 1 TABLET (75 MG TOTAL) BY MOUTH DAILY. (Patient not taking: Reported on 12/24/2015) 30 tablet 0    Musculoskeletal: Strength & Muscle Tone: within normal limits Gait & Station: normal Patient leans: N/A  Psychiatric Specialty Exam: Physical Exam  Constitutional: She is oriented to person, place, and time. She appears well-developed and well-nourished.  HENT:  Head: Normocephalic.  Neck: Normal range of motion.   Respiratory: Effort normal.  Musculoskeletal: Normal range of motion.  Neurological: She is alert and oriented to person, place, and time.  Psychiatric: Her speech is normal and behavior is normal. Judgment and thought content normal. Cognition and memory are normal. She exhibits a depressed mood.    Review of Systems  Psychiatric/Behavioral: Positive for depression and substance abuse.  All other systems reviewed and are negative.   Blood pressure 147/91, pulse 83, temperature 98.1 F (36.7 C), temperature source Oral, resp. rate 19, SpO2 94 %.There is no height or weight on file to calculate BMI.  General Appearance: Casual  Eye Contact:  Good  Speech:  Normal Rate  Volume:  Normal  Mood:  Depressed, mild  Affect:  Congruent  Thought Process:  Coherent and Descriptions of Associations: Intact  Orientation:  Full (Time, Place, and Person)  Thought Content:  WDL  Suicidal Thoughts:  No  Homicidal Thoughts:  No  Memory:  Immediate;   Good Recent;   Good Remote;   Good  Judgement:  Fair  Insight:  Fair  Psychomotor Activity:  Normal  Concentration:  Concentration: Good and Attention Span: Good  Recall:  Good  Fund of Knowledge:  Good  Language:  Good  Akathisia:  No  Handed:  Right  AIMS (if indicated):     Assets:  Housing Leisure Time Physical Health Resilience Social Support  ADL's:  Intact  Cognition:  WNL  Sleep:        Treatment Plan Summary: Daily contact with patient to assess and evaluate symptoms and progress in treatment, Medication management and Plan adjustment disorder with depressed mood:  -Crisis stabilization -Medication management:  No medications started due to patient wanting to leave and needing to clear alcohol -Individual and substance abuse counseling  Disposition: No evidence of imminent risk to self or others at present.    LORD, JAMISON, NP 04/13/2016 9:57 AM  Patient seen face-to-face for psychiatric evaluation, chart reviewed and  case discussed with the physician extender and developed treatment plan. Reviewed the information documented and agree with the treatment plan.  , MD 

## 2016-04-13 NOTE — Progress Notes (Addendum)
CSW met with the pt and discussed pt's need for transportation.  Pt signed R.O.I. provided by the CSW with pt's family's names and phone numbers and CSW called all informing them of pt's need for transportation.  CSW was unable to reach all but one who refused citing a need for gas.  CSW provided pt with a phone to continue calling family members.  CSW provided pt with a meeting list for Alcoholics Anonymous and educated pt as to the efficacy of 12-step programs for long-time community support for those needing support other than outpatient treatment.  Pt appreciated CSW's efforts and thanked the CSW.  1:11 PM CSW provided pt with a taxi voucher with permission from the Engineer, agricultural.  CSW verified pt's son would be at home to ensure the pt could get in her home.  Pt was then discharged.  CSW signing off.   Carla Bowen. Carla Bowen, Latanya Presser, LCAS Clinical Social Worker Ph: [phone]

## 2016-04-13 NOTE — Progress Notes (Signed)
CSW consulted to assist with transportation home at d/c. CSW spoke to nsg for update on dc status. NSG will contact CSW when pt is cleared for d/c. CSW will remain available to provide assistance when needed.  Cori RazorJamie Haille Pardi LCSW [phone].

## 2016-04-13 NOTE — ED Provider Notes (Signed)
[facility] DEPT Provider Note   CSN: [id number] Arrival date & time: 04/12/16  1817     History   Chief Complaint Chief Complaint  Patient presents with  . Suicidal  . Ingestion    HPI Carla Bowen is a 61 y.o. female.  Patient is depressed secondary to multiple stressors at home. Her son, his girlfriend, 4 children are living with her and depleting her financial resources. She lives on a disability check. She apparently took several Klonopin tablets today. No frank suicidal ideation.  Past medical history includes CVA with left hemiparesis      Past Medical History:  Diagnosis Date  . Fibromyalgia   . High cholesterol   . Hypertension   . Stroke [facility])     Patient Active Problem List   Diagnosis Date Noted  . Chest tightness   . History of stroke   . Left hemiparesis (HCC)   . Stroke (HCC)   . Chest pain 11/03/2014  . UTI (lower urinary tract infection) 11/03/2014  . HTN (hypertension) 11/03/2014  . ICH (intracerebral hemorrhage) (HCC) 05/30/2013  . Spastic hemiplegia affecting nondominant side (HCC) 05/30/2013  . Abnormality of gait 05/30/2013  . 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 Disturbance of skin sensation 05/30/2013    Past Surgical History:  Procedure Laterality Date  . CESAREAN SECTION      OB History    No data available       Home Medications    Prior to Admission medications   Medication Sig Start Date End Date Taking? Authorizing Provider  amLODipine (NORVASC) 10 MG tablet Take 1 tablet (10 mg total) by mouth daily. 06/25/13  Yes Micki Riley, MD  clonazePAM (KLONOPIN) 0.5 MG tablet Take 1 tablet by mouth 2 (two) times daily. 03/25/16  Yes Historical Provider, MD  cycloSPORINE (RESTASIS) 0.05 % ophthalmic emulsion Place 1 drop into both eyes 2 (two) times daily.   Yes Historical Provider, MD  DULoxetine (CYMBALTA) 60 MG capsule Take 60 mg by mouth daily.   Yes Historical Provider, MD  ibuprofen (ADVIL,MOTRIN) 200 MG tablet Take  400 mg by mouth every 6 (six) hours as needed for moderate pain.   Yes Historical Provider, MD  lisinopril (PRINIVIL,ZESTRIL) 40 MG tablet Take 40 mg by mouth daily.   Yes Historical Provider, MD  rosuvastatin (CRESTOR) 20 MG tablet Take 20 mg by mouth daily.   Yes Historical Provider, MD  baclofen (LIORESAL) 10 MG tablet Take 0.5 tablets (5 mg total) by mouth 2 (two) times daily. Patient not taking: Reported on 12/24/2015 11/04/14   Clydia Llano, MD  ciprofloxacin (CIPRO) 500 MG tablet Take 1 tablet (500 mg total) by mouth 2 (two) times daily. Patient not taking: Reported on 12/24/2015 11/04/14   Clydia Llano, MD  lansoprazole (PREVACID) 30 MG capsule Take 1 capsule (30 mg total) by mouth daily at 12 noon. Patient not taking: Reported on 12/24/2015 08/08/13   Benjiman Core, MD  lisinopril (PRINIVIL,ZESTRIL) 20 MG tablet Take 1 tablet (20 mg total) by mouth daily. Patient not taking: Reported on 12/24/2015 09/23/13   Micki Riley, MD  promethazine (PHENERGAN) 25 MG tablet Take 1 tablet (25 mg total) by mouth every 6 (six) hours as needed for nausea. Patient not taking: Reported on 12/24/2015 08/08/13   Benjiman Core, MD  venlafaxine (EFFEXOR) 75 MG tablet TAKE 1 TABLET (75 MG TOTAL) BY MOUTH DAILY. Patient not taking: Reported on 12/24/2015    Micki Riley, MD    Family History  Family History  Problem Relation Age of Onset  . Lung cancer Father   . Diabetes Brother   . Heart attack Brother   . Hypertension Brother   . Hypertension Sister     Social History Social History  Substance Use Topics  . Smoking status: Current Every Day Smoker  . Smokeless tobacco: Never Used  . Alcohol use Yes     Allergies   Ciprofloxacin; Codeine; and Sulfa antibiotics   Review of Systems Review of Systems  All other systems reviewed and are negative.    Physical Exam Updated Vital Signs BP 149/91 (BP Location: Right Arm)   Pulse 82   Temp 98.4 F (36.9 C) (Oral)   Resp 18   LMP   (LMP Unknown)   SpO2 94%   Physical Exam  Constitutional: She is oriented to person, place, and time.  No acute distress  HENT:  Head: Normocephalic and atraumatic.  Eyes: Conjunctivae are normal.  Neck: Neck supple.  Cardiovascular: Normal rate and regular rhythm.   Pulmonary/Chest: Effort normal and breath sounds normal.  Abdominal: Soft. Bowel sounds are normal.  Musculoskeletal: Normal range of motion.  Neurological: She is alert and oriented to person, place, and time.  Skin: Skin is warm and dry.  Psychiatric:  Flat affect, depressed  Nursing note and vitals reviewed.    ED Treatments / Results  Labs (all labs ordered are listed, but only abnormal results are displayed) Labs Reviewed  COMPREHENSIVE METABOLIC PANEL - Abnormal; Notable for the following:       Result Value   Potassium 2.7 (*)    Calcium 8.4 (*)    All other components within normal limits  ETHANOL - Abnormal; Notable for the following:    Alcohol, Ethyl (B) 150 (*)    All other components within normal limits  ACETAMINOPHEN LEVEL - Abnormal; Notable for the following:    Acetaminophen (Tylenol), Serum <10 (*)    All other components within normal limits  CBG MONITORING, ED - Abnormal; Notable for the following:    Glucose-Capillary 120 (*)    All other components within normal limits  SALICYLATE LEVEL  CBC  RAPID URINE DRUG SCREEN, HOSP PERFORMED    EKG  EKG Interpretation  Date/Time:  Tuesday April 12 2016 18:36:37 EST Ventricular Rate:  75 PR Interval:    QRS Duration: 82 QT Interval:  440 QTC Calculation: 492 R Axis:   66 Text Interpretation:  Sinus rhythm Minimal ST depression, anterolateral leads Borderline prolonged QT interval Confirmed by Adriana Simas  MD, Haille Pardi ([id number]) on 04/12/2016 8:17:27 PM       Radiology No results found.  Procedures Procedures (including critical care time)  Medications Ordered in ED Medications  potassium chloride SA (K-DUR,KLOR-CON) CR tablet 40 mEq  (40 mEq Oral Given 04/13/16 0002)     Initial Impression / Assessment and Plan / ED Course  I have reviewed the triage vital signs and the nursing notes.  Pertinent labs & imaging results that were available during my care of the patient were reviewed by me and considered in my medical decision making (see chart for details).     Patient is alert and hemodynamically stable. Will obtain behavioral health consult.  Final Clinical Impressions(s) / ED Diagnoses   Final diagnoses:  Depression, unspecified depression type  Hypokalemia  Elevated blood alcohol level, blood alcohol level not specified  Intentional benzodiazepine overdose, initial encounter [facility])    New Prescriptions New Prescriptions   No medications on file  Donnetta HutchingBrian Aly Hauser, MD 04/13/16 [phone]

## 2016-04-13 NOTE — ED Notes (Signed)
Patient sleeping, will give tylenol when she wakes.

## 2016-04-13 NOTE — ED Notes (Signed)
Patty from poison control called to review pt status. Patient is now cleared by poison control

## 2016-04-13 NOTE — [facility] Suicide Risk Assessment (Signed)
Suicide Risk Assessment  Discharge Assessment   [facility]   Principal Problem: Adjustment disorder with depressed mood Discharge Diagnoses:  Patient Active Problem List   Diagnosis Date Noted  . Adjustment disorder with depressed mood [F43.21] 04/13/2016    Priority: High  . Alcohol abuse [F10.10] 04/13/2016    Priority: High  . Chest tightness [R07.89]   . History of stroke [Z86.73]   . Left hemiparesis (HCC) [G81.94]   . Stroke [facility](HCC) [I63.9]   . Chest pain [R07.9] 11/03/2014  . UTI (lower urinary tract infection) [N39.0] 11/03/2014  . HTN (hypertension) [I10] 11/03/2014  . ICH (intracerebral hemorrhage) (HCC) [I61.9] 05/30/2013  . 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 Abnormality of gait [R26.9] 05/30/2013  . 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 Disturbance of skin sensation [R20.9] 05/30/2013    Total Time spent with patient: 45 minutes  Musculoskeletal: Strength & Muscle Tone: within normal limits Gait & Station: normal Patient leans: N/A  Psychiatric Specialty Exam: Physical Exam  Constitutional: She is oriented to person, place, and time. She appears well-developed and well-nourished.  HENT:  Head: Normocephalic.  Neck: Normal range of motion.  Respiratory: Effort normal.  Musculoskeletal: Normal range of motion.  Neurological: She is alert and oriented to person, place, and time.  Psychiatric: Her speech is normal and behavior is normal. Judgment and thought content normal. Cognition and memory are normal. She exhibits a depressed mood.    Review of Systems  Psychiatric/Behavioral: Positive for depression and substance abuse.  All other systems reviewed and are negative.   Blood pressure 147/91, pulse 83, temperature 98.1 F (36.7 C), temperature source Oral, resp. rate 19, SpO2 94 %.There is no height or weight on file to calculate BMI.  General Appearance: Casual  Eye Contact:  Good   Speech:  Normal Rate  Volume:  Normal  Mood:  Depressed, mild  Affect:  Congruent  Thought Process:  Coherent and Descriptions of Associations: Intact  Orientation:  Full (Time, Place, and Person)  Thought Content:  WDL  Suicidal Thoughts:  No  Homicidal Thoughts:  No  Memory:  Immediate;   Good Recent;   Good Remote;   Good  Judgement:  Fair  Insight:  Fair  Psychomotor Activity:  Normal  Concentration:  Concentration: Good and Attention Span: Good  Recall:  Good  Fund of Knowledge:  Good  Language:  Good  Akathisia:  No  Handed:  Right  AIMS (if indicated):     Assets:  Housing Leisure Time Physical Health Resilience Social Support  ADL's:  Intact  Cognition:  WNL  Sleep:       Mental Status Per Nursing Assessment::   On Admission:   alcohol abuse with suicidal ideations  Demographic Factors:  Caucasian  Loss Factors: NA  Historical Factors: NA  Risk Reduction Factors:   Sense of responsibility to family, Living with another person, especially a relative and Positive social support  Continued Clinical Symptoms:  Depressed, mild  Cognitive Features That Contribute To Risk:  None    Suicide Risk:  Minimal: No identifiable suicidal ideation.  Patients presenting with no risk factors but with morbid ruminations; may be classified as minimal risk based on the severity of the depressive symptoms    Plan Of Care/Follow-up recommendations:  Activity:  as tolerated Diet:  heart healthy diet  Joah Patlan, NP 04/13/2016, 10:09 AM

## 2016-04-19 ENCOUNTER — Ambulatory Visit: Payer: Self-pay | Admitting: Neurology

## 2016-04-21 ENCOUNTER — Encounter: Payer: Self-pay | Admitting: Neurology

## 2016-06-18 ENCOUNTER — Emergency Department (HOSPITAL_COMMUNITY)
Admission: EM | Admit: 2016-06-18 | Discharge: 2016-06-19 | Disposition: A | Discharge status: Home / Self Care | Payer: Medicare Other | Attending: Emergency Medicine | Admitting: Emergency Medicine

## 2016-06-18 ENCOUNTER — Encounter (HOSPITAL_COMMUNITY): Payer: Self-pay | Admitting: Emergency Medicine

## 2016-06-18 DIAGNOSIS — F1014 Alcohol abuse with alcohol-induced mood disorder: Secondary | ICD-10-CM | POA: Diagnosis not present

## 2016-06-18 DIAGNOSIS — Z8673 Personal history of transient ischemic attack (TIA), and cerebral infarction without residual deficits: Secondary | ICD-10-CM | POA: Diagnosis not present

## 2016-06-18 DIAGNOSIS — Z79899 Other long term (current) drug therapy: Secondary | ICD-10-CM | POA: Diagnosis not present

## 2016-06-18 DIAGNOSIS — I10 Essential (primary) hypertension: Secondary | ICD-10-CM | POA: Diagnosis not present

## 2016-06-18 DIAGNOSIS — F17200 Nicotine dependence, unspecified, uncomplicated: Secondary | ICD-10-CM | POA: Diagnosis not present

## 2016-06-18 DIAGNOSIS — F1020 Alcohol dependence, uncomplicated: Secondary | ICD-10-CM

## 2016-06-18 DIAGNOSIS — Z7982 Long term (current) use of aspirin: Secondary | ICD-10-CM | POA: Diagnosis not present

## 2016-06-18 DIAGNOSIS — F17210 Nicotine dependence, cigarettes, uncomplicated: Secondary | ICD-10-CM | POA: Diagnosis not present

## 2016-06-18 DIAGNOSIS — R45851 Suicidal ideations: Secondary | ICD-10-CM | POA: Diagnosis present

## 2016-06-18 HISTORY — DX: Major depressive disorder, single episode, unspecified: F32.9

## 2016-06-18 HISTORY — DX: Depression, unspecified: F32.A

## 2016-06-18 LAB — CBC WITH DIFFERENTIAL/PLATELET
Basophils Absolute: 0.1 10*3/uL (ref 0.0–0.1)
Basophils Relative: 1 %
Eosinophils Absolute: 0.1 10*3/uL (ref 0.0–0.7)
Eosinophils Relative: 2 %
HCT: 46.1 % — ABNORMAL HIGH (ref 36.0–46.0)
Hemoglobin: 16 g/dL — ABNORMAL HIGH (ref 12.0–15.0)
Lymphocytes Relative: 42 %
Lymphs Abs: 3.2 10*3/uL (ref 0.7–4.0)
MCH: 33 pg (ref 26.0–34.0)
MCHC: 34.7 g/dL (ref 30.0–36.0)
MCV: 95.1 fL (ref 78.0–100.0)
Monocytes Absolute: 0.5 10*3/uL (ref 0.1–1.0)
Monocytes Relative: 6 %
Neutro Abs: 3.8 10*3/uL (ref 1.7–7.7)
Neutrophils Relative %: 49 %
Platelets: 291 10*3/uL (ref 150–400)
RBC: 4.85 MIL/uL (ref 3.87–5.11)
RDW: 14.2 % (ref 11.5–15.5)
WBC: 7.7 10*3/uL (ref 4.0–10.5)

## 2016-06-18 LAB — COMPREHENSIVE METABOLIC PANEL
ALT: 44 U/L (ref 14–54)
AST: 60 U/L — ABNORMAL HIGH (ref 15–41)
Albumin: 4.6 g/dL (ref 3.5–5.0)
Alkaline Phosphatase: 91 U/L (ref 38–126)
Anion gap: 13 (ref 5–15)
BUN: 16 mg/dL (ref 6–20)
CO2: 22 mmol/L (ref 22–32)
Calcium: 9.2 mg/dL (ref 8.9–10.3)
Chloride: 107 mmol/L (ref 101–111)
Creatinine, Ser: 0.58 mg/dL (ref 0.44–1.00)
GFR calc Af Amer: >60 mL/min (ref >60)
GFR calc non Af Amer: >60 mL/min (ref >60)
Glucose, Bld: 89 mg/dL (ref 65–99)
Potassium: 3.8 mmol/L (ref 3.5–5.1)
Sodium: 142 mmol/L (ref 135–145)
Total Bilirubin: 0.4 mg/dL (ref 0.3–1.2)
Total Protein: 7.8 g/dL (ref 6.5–8.1)

## 2016-06-18 LAB — ETHANOL: Alcohol, Ethyl (B): 249 mg/dL — ABNORMAL HIGH (ref <5)

## 2016-06-18 LAB — RAPID URINE DRUG SCREEN, HOSP PERFORMED
Amphetamines: NOT DETECTED
Barbiturates: NOT DETECTED
Benzodiazepines: NOT DETECTED
Cocaine: NOT DETECTED
Opiates: NOT DETECTED
Tetrahydrocannabinol: NOT DETECTED

## 2016-06-18 LAB — SALICYLATE LEVEL: Salicylate Lvl: <7 mg/dL (ref 2.8–30.0)

## 2016-06-18 LAB — ACETAMINOPHEN LEVEL: Acetaminophen (Tylenol), Serum: <10 ug/mL — ABNORMAL LOW (ref 10–30)

## 2016-06-18 MED ORDER — DIPHENHYDRAMINE HCL 50 MG/ML IJ SOLN: 50.0000 mg | Freq: Once | INTRAMUSCULAR | Status: DC

## 2016-06-18 MED ORDER — LISINOPRIL 40 MG PO TABS
40.0000 mg | ORAL_TABLET | Freq: Every day | ORAL | Status: DC
  Administered 2016-06-19: 40 mg via ORAL
  Filled 2016-06-18 (×2): qty 1

## 2016-06-18 MED ORDER — ADULT MULTIVITAMIN W/MINERALS CH
1.0000 | ORAL_TABLET | Freq: Every day | ORAL | Status: DC
  Administered 2016-06-19: 1 via ORAL
  Filled 2016-06-18: qty 1

## 2016-06-18 MED ORDER — STERILE WATER FOR INJECTION IJ SOLN
INTRAMUSCULAR | Status: AC
  Administered 2016-06-18: 10 mL
  Filled 2016-06-18: qty 10

## 2016-06-18 MED ORDER — LORAZEPAM 2 MG/ML IJ SOLN: 1.0000 mg | Freq: Four times a day (QID) | INTRAMUSCULAR | Status: DC | PRN

## 2016-06-18 MED ORDER — DULOXETINE HCL 30 MG PO CPEP
60.0000 mg | ORAL_CAPSULE | Freq: Every day | ORAL | Status: DC
  Filled 2016-06-18 (×2): qty 2

## 2016-06-18 MED ORDER — ZIPRASIDONE MESYLATE 20 MG IM SOLR: 20.0000 mg | Freq: Once | INTRAMUSCULAR | Status: DC

## 2016-06-18 MED ORDER — ZIPRASIDONE MESYLATE 20 MG IM SOLR
10.0000 mg | Freq: Once | INTRAMUSCULAR | Status: AC
  Administered 2016-06-18: 10 mg via INTRAMUSCULAR
  Filled 2016-06-18: qty 20

## 2016-06-18 MED ORDER — ROSUVASTATIN CALCIUM 20 MG PO TABS
20.0000 mg | ORAL_TABLET | Freq: Every day | ORAL | Status: DC
  Administered 2016-06-19: 20 mg via ORAL
  Filled 2016-06-18 (×2): qty 1

## 2016-06-18 MED ORDER — LORAZEPAM 2 MG/ML IJ SOLN: 2.0000 mg | Freq: Once | INTRAMUSCULAR | Status: DC

## 2016-06-18 MED ORDER — LORAZEPAM 1 MG PO TABS: 1.0000 mg | ORAL_TABLET | Freq: Four times a day (QID) | ORAL | Status: DC | PRN

## 2016-06-18 MED ORDER — LORAZEPAM 1 MG PO TABS: 0.0000 mg | ORAL_TABLET | Freq: Two times a day (BID) | ORAL | Status: DC

## 2016-06-18 MED ORDER — AMLODIPINE BESYLATE 5 MG PO TABS
10.0000 mg | ORAL_TABLET | Freq: Every day | ORAL | Status: DC
  Administered 2016-06-19: 10 mg via ORAL
  Filled 2016-06-18 (×2): qty 2

## 2016-06-18 MED ORDER — CYCLOSPORINE 0.05 % OP EMUL
1.0000 [drp] | Freq: Two times a day (BID) | OPHTHALMIC | Status: DC
  Administered 2016-06-18 – 2016-06-19 (×2): 1 [drp] via OPHTHALMIC
  Filled 2016-06-18 (×2): qty 1

## 2016-06-18 MED ORDER — HALOPERIDOL LACTATE 5 MG/ML IJ SOLN: 5.0000 mg | Freq: Once | INTRAMUSCULAR | Status: DC

## 2016-06-18 MED ORDER — VITAMIN B-1 100 MG PO TABS
100.0000 mg | ORAL_TABLET | Freq: Every day | ORAL | Status: DC
  Administered 2016-06-19: 100 mg via ORAL
  Filled 2016-06-18: qty 1

## 2016-06-18 MED ORDER — ACETAMINOPHEN 325 MG PO TABS
325.0000 mg | ORAL_TABLET | Freq: Once | ORAL | Status: AC
  Administered 2016-06-18: 325 mg via ORAL
  Filled 2016-06-18: qty 1

## 2016-06-18 MED ORDER — LORAZEPAM 1 MG PO TABS
0.0000 mg | ORAL_TABLET | Freq: Four times a day (QID) | ORAL | Status: DC
  Administered 2016-06-18: 1 mg via ORAL
  Filled 2016-06-18: qty 1

## 2016-06-18 MED ORDER — THIAMINE HCL 100 MG/ML IJ SOLN: 100.0000 mg | Freq: Every day | INTRAMUSCULAR | Status: DC

## 2016-06-18 MED ORDER — FOLIC ACID 1 MG PO TABS
1.0000 mg | ORAL_TABLET | Freq: Every day | ORAL | Status: DC
  Administered 2016-06-19: 1 mg via ORAL
  Filled 2016-06-18: qty 1

## 2016-06-18 NOTE — ED Notes (Signed)
Bed: WU98 Expected date:  Expected time:  Means of arrival:  Comments: Combative [employer]

## 2016-06-18 NOTE — ED Provider Notes (Addendum)
[facility] DEPT Provider Note   CSN: [id number] Arrival date & time: 06/18/16  1428     History   Chief Complaint Chief Complaint  Patient presents with  . IVC  . Suicidal  Level V caveat psychiatric complaint. Patient brought by police. History obtained from [employer]  HPI Carla Bowen is a 61 y.o. female. Patient called 911 stating she wants to kill herself. EMS reports that she was combative in the field 20 And spit and kicked the police car. She had to be carried by the police officer due to lack of cooperation. She admits to not taking her Cymbalta for the past 4 days. She admits to drinking alcohol last time yesterday. Police filed involuntary commit appetite  HPI  Past Medical History:  Diagnosis Date  . Depression   . Fibromyalgia   . High cholesterol   . Hypertension   . Stroke [facility])     Patient Active Problem List   Diagnosis Date Noted  . Adjustment disorder with depressed mood 04/13/2016  . Alcohol abuse 04/13/2016  . Chest tightness   . History of stroke   . Left hemiparesis (HCC)   . Stroke (HCC)   . Chest pain 11/03/2014  . UTI (lower urinary tract infection) 11/03/2014  . HTN (hypertension) 11/03/2014  . ICH (intracerebral hemorrhage) (HCC) 05/30/2013  . Spastic hemiplegia affecting nondominant side (HCC) 05/30/2013  . Abnormality of gait 05/30/2013  . 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 Disturbance of skin sensation 05/30/2013    Past Surgical History:  Procedure Laterality Date  . CESAREAN SECTION      OB History    No data available       Home Medications    Prior to Admission medications   Medication Sig Start Date End Date Taking? Authorizing Provider  amLODipine (NORVASC) 10 MG tablet Take 1 tablet (10 mg total) by mouth daily. 06/25/13   Micki Riley, MD  baclofen (LIORESAL) 10 MG tablet Take 0.5 tablets (5 mg total) by mouth 2 (two) times daily. Patient not taking: Reported on 12/24/2015 11/04/14   Clydia Llano, MD   ciprofloxacin (CIPRO) 500 MG tablet Take 1 tablet (500 mg total) by mouth 2 (two) times daily. Patient not taking: Reported on 12/24/2015 11/04/14   Clydia Llano, MD  clonazePAM (KLONOPIN) 0.5 MG tablet Take 1 tablet by mouth 2 (two) times daily. 03/25/16   Historical Provider, MD  cycloSPORINE (RESTASIS) 0.05 % ophthalmic emulsion Place 1 drop into both eyes 2 (two) times daily.    Historical Provider, MD  DULoxetine (CYMBALTA) 60 MG capsule Take 60 mg by mouth daily.    Historical Provider, MD  ibuprofen (ADVIL,MOTRIN) 200 MG tablet Take 400 mg by mouth every 6 (six) hours as needed for moderate pain.    Historical Provider, MD  lansoprazole (PREVACID) 30 MG capsule Take 1 capsule (30 mg total) by mouth daily at 12 noon. Patient not taking: Reported on 12/24/2015 08/08/13   Benjiman Core, MD  lisinopril (PRINIVIL,ZESTRIL) 20 MG tablet Take 1 tablet (20 mg total) by mouth daily. Patient not taking: Reported on 12/24/2015 09/23/13   Micki Riley, MD  lisinopril (PRINIVIL,ZESTRIL) 40 MG tablet Take 40 mg by mouth daily.    Historical Provider, MD  promethazine (PHENERGAN) 25 MG tablet Take 1 tablet (25 mg total) by mouth every 6 (six) hours as needed for nausea. Patient not taking: Reported on 12/24/2015 08/08/13   Benjiman Core, MD  rosuvastatin (CRESTOR) 20 MG tablet Take 20 mg by  mouth daily.    Historical Provider, MD  venlafaxine (EFFEXOR) 75 MG tablet TAKE 1 TABLET (75 MG TOTAL) BY MOUTH DAILY. Patient not taking: Reported on 12/24/2015    Micki Riley, MD    Family History Family History  Problem Relation Age of Onset  . Lung cancer Father   . Diabetes Brother   . Heart attack Brother   . Hypertension Brother   . Hypertension Sister     Social History Social History  Substance Use Topics  . Smoking status: Current Every Day Smoker  . Smokeless tobacco: Never Used  . Alcohol use Yes     Allergies   Ciprofloxacin; Codeine; and Sulfa antibiotics   Review of  Systems Review of Systems  Unable to perform ROS: Psychiatric disorder  Neurological:       Chronic left-sided weakness     Physical Exam Updated Vital Signs LMP  (LMP Unknown)   Physical Exam  Constitutional:  Chronically ill-appearing, argumentative  HENT:  Head: Normocephalic and atraumatic.  Eyes: Conjunctivae are normal. Pupils are equal, round, and reactive to light.  Neck: Neck supple. No tracheal deviation present. No thyromegaly present.  Cardiovascular: Normal rate and regular rhythm.   No murmur heard. Pulmonary/Chest: Effort normal and breath sounds normal.  Abdominal: Soft. Bowel sounds are normal. She exhibits no distension. There is no tenderness.  Musculoskeletal: Normal range of motion. She exhibits no edema or tenderness.  Neurological: She is alert. No cranial nerve deficit. Coordination normal.  Follow simple commands. Walks with a limp favoring left leg  Skin: Skin is warm and dry. No rash noted.  Psychiatric: She has a normal mood and affect.  Nursing note and vitals reviewed.    ED Treatments / Results  Labs (all labs ordered are listed, but only abnormal results are displayed) Labs Reviewed  COMPREHENSIVE METABOLIC PANEL  ETHANOL  CBC WITH DIFFERENTIAL/PLATELET  RAPID URINE DRUG SCREEN, HOSP PERFORMED  ACETAMINOPHEN LEVEL  SALICYLATE LEVEL    EKG  EKG Interpretation  Date/Time:  Saturday June 18 2016 16:16:26 EDT Ventricular Rate:  77 PR Interval:    QRS Duration: 87 QT Interval:  397 QTC Calculation: 450 R Axis:   42 Text Interpretation:  Sinus rhythm Abnormal R-wave progression, early transition No significant change since last tracing Confirmed by Ethelda Chick  MD, Evelynn Hench [phone]) on 06/18/2016 4:19:45 PM       Radiology No results found.  Procedures Procedures (including critical care time)  Medications Ordered in ED Medications - No data to display Results for orders placed or performed during the hospital encounter of 06/18/16   Comprehensive metabolic panel  Result Value Ref Range   Sodium 142 135 - 145 mmol/L   Potassium 3.8 3.5 - 5.1 mmol/L   Chloride 107 101 - 111 mmol/L   CO2 22 22 - 32 mmol/L   Glucose, Bld 89 65 - 99 mg/dL   BUN 16 6 - 20 mg/dL   Creatinine, Ser 6.04 0.44 - 1.00 mg/dL   Calcium 9.2 8.9 - 54.0 mg/dL   Total Protein 7.8 6.5 - 8.1 g/dL   Albumin 4.6 3.5 - 5.0 g/dL   AST 60 (H) 15 - 41 U/L   ALT 44 14 - 54 U/L   Alkaline Phosphatase 91 38 - 126 U/L   Total Bilirubin 0.4 0.3 - 1.2 mg/dL   GFR calc non Af Amer >60 >60 mL/min   GFR calc Af Amer >60 >60 mL/min   Anion gap 13 5 - 15  Ethanol  Result Value Ref Range   Alcohol, Ethyl (B) 249 (H) <5 mg/dL  CBC with Diff  Result Value Ref Range   WBC 7.7 4.0 - 10.5 K/uL   RBC 4.85 3.87 - 5.11 MIL/uL   Hemoglobin 16.0 (H) 12.0 - 15.0 g/dL   HCT 16.1 (H) 09.6 - 04.5 %   MCV 95.1 78.0 - 100.0 fL   MCH 33.0 26.0 - 34.0 pg   MCHC 34.7 30.0 - 36.0 g/dL   RDW 40.9 81.1 - 91.4 %   Platelets 291 150 - 400 K/uL   Neutrophils Relative % 49 %   Neutro Abs 3.8 1.7 - 7.7 K/uL   Lymphocytes Relative 42 %   Lymphs Abs 3.2 0.7 - 4.0 K/uL   Monocytes Relative 6 %   Monocytes Absolute 0.5 0.1 - 1.0 K/uL   Eosinophils Relative 2 %   Eosinophils Absolute 0.1 0.0 - 0.7 K/uL   Basophils Relative 1 %   Basophils Absolute 0.1 0.0 - 0.1 K/uL  Urine rapid drug screen (hosp performed)not at [facility]  Result Value Ref Range   Opiates NONE DETECTED NONE DETECTED   Cocaine NONE DETECTED NONE DETECTED   Benzodiazepines NONE DETECTED NONE DETECTED   Amphetamines NONE DETECTED NONE DETECTED   Tetrahydrocannabinol NONE DETECTED NONE DETECTED   Barbiturates NONE DETECTED NONE DETECTED  Acetaminophen level  Result Value Ref Range   Acetaminophen (Tylenol), Serum <10 (L) 10 - 30 ug/mL  Salicylate level  Result Value Ref Range   Salicylate Lvl <7.0 2.8 - 30.0 mg/dL   No results found.  Initial Impression / Assessment and Plan / ED Course  I have reviewed the  triage vital signs and the nursing notes.  Pertinent labs & imaging results that were available during my care of the patient were reviewed by me and considered in my medical decision making (see chart for details).     Patient pacing back and forth. Grabbing at police officers present in the ED. Uncooperative. IM Geodon ordered Complains of mild diffuse headache. Alert appropriate. Tylenol ordered. 4:05 PM patient resting comfortably after treatment with Tylenol and IM Geodon. More cooperative states "I'm just a little tired". TTS consulted Patient is medically cleared for psychiatric evaluation Final Clinical Impressions(s) / ED Diagnoses  Dx#1 suicidal ideation #2 alcohol intoxication Final diagnoses:  None  CRITICAL CARE Performed by: Doug Sou Total critical care time: 30 minutes Critical care time was exclusive of separately billable procedures and treating other patients. Critical care was necessary to treat or prevent imminent or life-threatening deterioration. Critical care was time spent personally by me on the following activities: development of treatment plan with patient and/or surrogate as well as nursing, discussions with consultants, evaluation of patient's response to treatment, examination of patient, obtaining history from patient or surrogate, ordering and performing treatments and interventions, ordering and review of laboratory studies, ordering and review of radiographic studies, pulse oximetry and re-evaluation of patient's condition.  New Prescriptions New Prescriptions   No medications on file     Doug Sou, MD 06/18/16 [id number]    Doug Sou, MD 06/19/16 0110

## 2016-06-18 NOTE — ED Notes (Signed)
TTS at bedside. Pt able to go to [facility] after she finishes.

## 2016-06-18 NOTE — ED Notes (Signed)
Clothing and cell phone placed in patient belongings bag, patient label sticker affixed to bag. Patient to be transferred to [facility].

## 2016-06-18 NOTE — ED Notes (Signed)
Patient refusing EKG at this time.

## 2016-06-18 NOTE — ED Notes (Signed)
Hourly rounding reveals patient in room. Stable, in no acute distress. Q15 minute rounds and monitoring via [employer] to continue. 

## 2016-06-18 NOTE — ED Notes (Signed)
Pt refusing to chagne in to scrubs, refusing vitals, refusing to sit up in the bed. Officer Bush from [employer] witnessed encouraging patient to grab other officer's buttocks. Officers warned that this is not appropriate and not tolerated. Was also witnessed by nurse Lyla Son.
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
## 2016-06-18 NOTE — ED Notes (Signed)
Patient arrived to unit and appears to be drowsy and unsteady after receiving medication in the main ED. Patient cooperative and answering questions appropriately. This writer instructed patient multiple times to call for assistance and to push the call light beside of her prior to ambulating. Patient verbally agreed and repeated instructions to nurse. Patient given extra blankets on arrival. No signs or symptoms of distress noted. Patient denies suicidal or homicidal ideations at this time and verbally contracts for safety.

## 2016-06-18 NOTE — ED Notes (Signed)
Hourly rounding reveals patient sleeping in room. No complaints, stable, in no acute distress. Q15 minute rounds and monitoring via Security Cameras to continue. 

## 2016-06-18 NOTE — ED Notes (Signed)
Report to include situation, background, assessment and recommendations from Michelle RN. Patient sleeping, respirations regular and unlabored. Q15 minute rounds and security camera observation to continue.   
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
## 2016-06-18 NOTE — ED Notes (Signed)
Hourly rounding reveals patient in room. No complaints, stable, in no acute distress. Q15 minute rounds and monitoring via Security Cameras to continue.Snack and beverage given. 

## 2016-06-18 NOTE — ED Notes (Signed)
WAS ABLE TO GET PT CHANGED INTO SCRUBS WITH THE HELP OF THE SITTER BUT SHE IS REFUSING VITALS AND EKG
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
## 2016-06-19 DIAGNOSIS — F1014 Alcohol abuse with alcohol-induced mood disorder: Secondary | ICD-10-CM | POA: Diagnosis not present

## 2016-06-19 DIAGNOSIS — F17210 Nicotine dependence, cigarettes, uncomplicated: Secondary | ICD-10-CM | POA: Diagnosis not present

## 2016-06-19 DIAGNOSIS — Z79899 Other long term (current) drug therapy: Secondary | ICD-10-CM | POA: Diagnosis not present

## 2016-06-19 DIAGNOSIS — Z7982 Long term (current) use of aspirin: Secondary | ICD-10-CM

## 2016-06-19 DIAGNOSIS — F1020 Alcohol dependence, uncomplicated: Secondary | ICD-10-CM

## 2016-06-19 MED ORDER — DULOXETINE HCL 60 MG PO CPEP: 60.0000 mg | ORAL_CAPSULE | Freq: Every day | ORAL | 0 refills | Status: DC

## 2016-06-19 MED ORDER — GABAPENTIN 100 MG PO CAPS: 100.0000 mg | ORAL_CAPSULE | Freq: Two times a day (BID) | ORAL | 0 refills | Status: DC | PRN

## 2016-06-19 MED ORDER — DULOXETINE HCL 30 MG PO CPEP
60.0000 mg | ORAL_CAPSULE | Freq: Every day | ORAL | Status: DC
  Administered 2016-06-19: 60 mg via ORAL

## 2016-06-19 MED ORDER — GABAPENTIN 100 MG PO CAPS: 100.0000 mg | ORAL_CAPSULE | Freq: Two times a day (BID) | ORAL | Status: DC | PRN

## 2016-06-19 NOTE — ED Notes (Signed)
Pt discharged safely with significant other.  Discharge instructions and RX were reviewed.  AA schedule was given to pt.  All belongings were returned to pt.

## 2016-06-19 NOTE — ED Notes (Signed)
Hourly rounding reveals patient sleeping in room. No complaints, stable, in no acute distress. Q15 minute rounds and monitoring via Security Cameras to continue. 

## 2016-06-19 NOTE — Consult Note (Signed)
[facility] Consult   Reason for Consult:  Alcohol abuse Referring Physician:  EDP Patient Identification: Carla Bowen MRN:  [id number] Principal Diagnosis: Alcohol abuse with alcohol-induced mood disorder [facility]) Diagnosis:   Patient Active Problem List   Diagnosis Date Noted  . Alcohol abuse with alcohol-induced mood disorder ([place]) [F10.14] 06/19/2016    Priority: High  . Alcohol abuse [F10.10] 04/13/2016    Priority: High  . Chest tightness [R07.89]   . History of stroke [Z86.73]   . Left hemiparesis ([place]) [G81.94]   . Stroke [facility]) [I63.9]   . Chest pain [R07.9] 11/03/2014  . UTI (lower urinary tract infection) [N39.0] 11/03/2014  . HTN (hypertension) [I10] 11/03/2014  . ICH (intracerebral hemorrhage) ([place]) [I61.9] 05/30/2013  . Spastic hemiplegia affecting nondominant side ([place]) [G81.10] 05/30/2013  . Abnormality of gait [R26.9] 05/30/2013  . 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 Disturbance of skin sensation [R20.9] 05/30/2013    Total Time spent with patient: 45 minutes  Subjective:   Carla Bowen is a 61 y.o. female patient does not warrant admission.  HPI:  61 yo female who was brought to the ED by her husband for alcohol abuse.  Today she is clear and coherent with no suicidal/homicidal ideations, hallucinations.  Slight tremors on assessment but does not want rehab or detox.  Husband visited and would like her to come home.  Gabapentin started and encouraged to take for anxiety and any withdrawal symptoms.  Past Psychiatric History: alcohol abuse  Risk to Self: Suicidal Ideation: No Suicidal Intent: No Is patient at risk for suicide?: No Suicidal Plan?: No Access to Means: No What has been your use of drugs/alcohol within the last 12 months?: Alcohol How many times?: 0 Other Self Harm Risks: None Triggers for Past Attempts: None known Intentional Self Injurious Behavior: None Risk to Others: Homicidal Ideation: No Thoughts of Harm  to Others: No Current Homicidal Intent: No Current Homicidal Plan: No Access to Homicidal Means: No Identified Victim: N/A History of harm to others?: No Assessment of Violence: None Noted Violent Behavior Description: N/A Does patient have access to weapons?: Yes (Comment) (Patienr reports Spouse has numerous guns in the home) Criminal Charges Pending?: No Does patient have a court date: No Prior Inpatient Therapy: Prior Inpatient Therapy: No Prior Therapy Dates: N/A Prior Therapy Facilty/Provider(s): N/A Reason for Treatment: N/A Prior Outpatient Therapy: Prior Outpatient Therapy: No Prior Therapy Dates: N/A Prior Therapy Facilty/Provider(s): N/A Reason for Treatment: N/A Does patient have an ACCT team?: No Does patient have Intensive In-House Services?  : No Does patient have [facility] services? : No Does patient have P4CC services?: No  Past Medical History:  Past Medical History:  Diagnosis Date  . Depression   . Fibromyalgia   . High cholesterol   . Hypertension   . Stroke [facility])     Past Surgical History:  Procedure Laterality Date  . CESAREAN SECTION     Family History:  Family History  Problem Relation Age of Onset  . Lung cancer Father   . Diabetes Brother   . Heart attack Brother   . Hypertension Brother   . Hypertension Sister    Family Psychiatric  History: substance abuse Social History:  History  Alcohol Use  . Yes     History  Drug Use No    Social History   Social History  . Marital status: Divorced    Spouse name: N/A  . Number of children: 1  . Years of education:  N/A   Occupational History  . Disablity     Social History Main Topics  . Smoking status: Current Every Day Smoker  . Smokeless tobacco: Never Used  . Alcohol use Yes  . Drug use: No  . Sexual activity: Not Asked   Other Topics Concern  . None   Social History Narrative   Patient is right handed and lives at home with family.Patient drinks caffinated drinks.    Additional Social History:    Allergies:   Allergies  Allergen Reactions  . Ciprofloxacin Itching, Nausea And Vomiting and Rash  . Codeine Hives  . Sulfa Antibiotics Hives    Labs:  Results for orders placed or performed during the hospital encounter of 06/18/16 (from the past 48 hour(s))  Urine rapid drug screen (hosp performed)not at [facility]     Status: None   Collection Time: 06/18/16  2:33 PM  Result Value Ref Range   Opiates NONE DETECTED NONE DETECTED   Cocaine NONE DETECTED NONE DETECTED   Benzodiazepines NONE DETECTED NONE DETECTED   Amphetamines NONE DETECTED NONE DETECTED   Tetrahydrocannabinol NONE DETECTED NONE DETECTED   Barbiturates NONE DETECTED NONE DETECTED    Comment:        DRUG SCREEN FOR MEDICAL PURPOSES ONLY.  IF CONFIRMATION IS NEEDED FOR ANY PURPOSE, NOTIFY LAB WITHIN 5 DAYS.        LOWEST DETECTABLE LIMITS FOR URINE DRUG SCREEN Drug Class       Cutoff (ng/mL) Amphetamine      1000 Barbiturate      200 Benzodiazepine   409 Tricyclics       811 Opiates          300 Cocaine          300 THC              50   Comprehensive metabolic panel     Status: Abnormal   Collection Time: 06/18/16  2:44 PM  Result Value Ref Range   Sodium 142 135 - 145 mmol/L   Potassium 3.8 3.5 - 5.1 mmol/L   Chloride 107 101 - 111 mmol/L   CO2 22 22 - 32 mmol/L   Glucose, Bld 89 65 - 99 mg/dL   BUN 16 6 - 20 mg/dL   Creatinine, Ser 0.58 0.44 - 1.00 mg/dL   Calcium 9.2 8.9 - 10.3 mg/dL   Total Protein 7.8 6.5 - 8.1 g/dL   Albumin 4.6 3.5 - 5.0 g/dL   AST 60 (H) 15 - 41 U/L   ALT 44 14 - 54 U/L   Alkaline Phosphatase 91 38 - 126 U/L   Total Bilirubin 0.4 0.3 - 1.2 mg/dL   GFR calc non Af Amer >60 >60 mL/min   GFR calc Af Amer >60 >60 mL/min    Comment: (NOTE) The eGFR has been calculated using the CKD EPI equation. This calculation has not been validated in all clinical situations. eGFR's persistently <60 mL/min signify possible Chronic Kidney Disease.     Anion gap 13 5 - 15  Ethanol     Status: Abnormal   Collection Time: 06/18/16  2:44 PM  Result Value Ref Range   Alcohol, Ethyl (B) 249 (H) <5 mg/dL    Comment:        LOWEST DETECTABLE LIMIT FOR SERUM ALCOHOL IS 5 mg/dL FOR MEDICAL PURPOSES ONLY   CBC with Diff     Status: Abnormal   Collection Time: 06/18/16  2:44 PM  Result Value Ref Range  WBC 7.7 4.0 - 10.5 K/uL   RBC 4.85 3.87 - 5.11 MIL/uL   Hemoglobin 16.0 (H) 12.0 - 15.0 g/dL   HCT 46.1 (H) 36.0 - 46.0 %   MCV 95.1 78.0 - 100.0 fL   MCH 33.0 26.0 - 34.0 pg   MCHC 34.7 30.0 - 36.0 g/dL   RDW 14.2 11.5 - 15.5 %   Platelets 291 150 - 400 K/uL   Neutrophils Relative % 49 %   Neutro Abs 3.8 1.7 - 7.7 K/uL   Lymphocytes Relative 42 %   Lymphs Abs 3.2 0.7 - 4.0 K/uL   Monocytes Relative 6 %   Monocytes Absolute 0.5 0.1 - 1.0 K/uL   Eosinophils Relative 2 %   Eosinophils Absolute 0.1 0.0 - 0.7 K/uL   Basophils Relative 1 %   Basophils Absolute 0.1 0.0 - 0.1 K/uL  Acetaminophen level     Status: Abnormal   Collection Time: 06/18/16  2:44 PM  Result Value Ref Range   Acetaminophen (Tylenol), Serum <10 (L) 10 - 30 ug/mL    Comment:        THERAPEUTIC CONCENTRATIONS VARY SIGNIFICANTLY. A RANGE OF 10-30 ug/mL MAY BE AN EFFECTIVE CONCENTRATION FOR MANY PATIENTS. HOWEVER, SOME ARE BEST TREATED AT CONCENTRATIONS OUTSIDE THIS RANGE. ACETAMINOPHEN CONCENTRATIONS >150 ug/mL AT 4 HOURS AFTER INGESTION AND >50 ug/mL AT 12 HOURS AFTER INGESTION ARE OFTEN ASSOCIATED WITH TOXIC REACTIONS.   Salicylate level     Status: None   Collection Time: 06/18/16  2:44 PM  Result Value Ref Range   Salicylate Lvl <2.0 2.8 - 30.0 mg/dL    Current Facility-Administered Medications  Medication Dose Route Frequency Provider Last Rate Last Dose  . amLODipine (NORVASC) tablet 10 mg  10 mg Oral Daily Orlie Dakin, MD   10 mg at 06/19/16 0959  . cycloSPORINE (RESTASIS) 0.05 % ophthalmic emulsion 1 drop  1 drop Both Eyes BID Orlie Dakin,  MD   1 drop at 06/19/16 0959  . DULoxetine (CYMBALTA) DR capsule 60 mg  60 mg Oral Daily Sam Jacubowitz, MD      . DULoxetine (CYMBALTA) DR capsule 60 mg  60 mg Oral Daily Patrecia Pour, NP   60 mg at 06/19/16 1000  . folic acid (FOLVITE) tablet 1 mg  1 mg Oral Daily Orlie Dakin, MD   1 mg at 06/19/16 1000  . lisinopril (PRINIVIL,ZESTRIL) tablet 40 mg  40 mg Oral Daily Orlie Dakin, MD   40 mg at 06/19/16 0959  . LORazepam (ATIVAN) tablet 1 mg  1 mg Oral Q6H PRN Orlie Dakin, MD       Or  . LORazepam (ATIVAN) injection 1 mg  1 mg Intravenous Q6H PRN Orlie Dakin, MD      . LORazepam (ATIVAN) tablet 0-4 mg  0-4 mg Oral Q6H Orlie Dakin, MD   1 mg at 06/18/16 2107   Followed by  . [START ON 06/20/2016] LORazepam (ATIVAN) tablet 0-4 mg  0-4 mg Oral Q12H Orlie Dakin, MD      . multivitamin with minerals tablet 1 tablet  1 tablet Oral Daily Orlie Dakin, MD   1 tablet at 06/19/16 0959  . rosuvastatin (CRESTOR) tablet 20 mg  20 mg Oral Daily Orlie Dakin, MD   20 mg at 06/19/16 0959  . thiamine (VITAMIN B-1) tablet 100 mg  100 mg Oral Daily Orlie Dakin, MD   100 mg at 06/19/16 1000   Current Outpatient Prescriptions  Medication Sig Dispense Refill  . amLODipine (  NORVASC) 10 MG tablet Take 1 tablet (10 mg total) by mouth daily. 90 tablet 0  . ARIPiprazole (ABILIFY) 2 MG tablet Take 2 mg by mouth daily.    Marland Kitchen aspirin EC 81 MG tablet Take 81 mg by mouth daily.    . chlorthalidone (HYGROTON) 25 MG tablet Take 25 mg by mouth daily.    . clonazePAM (KLONOPIN) 0.5 MG tablet Take 1 tablet by mouth 2 (two) times daily.    . cycloSPORINE (RESTASIS) 0.05 % ophthalmic emulsion Place 1 drop into both eyes 2 (two) times daily.    . DULoxetine (CYMBALTA) 60 MG capsule Take 60 mg by mouth daily.    Marland Kitchen ibuprofen (ADVIL,MOTRIN) 200 MG tablet Take 400 mg by mouth every 6 (six) hours as needed for moderate pain.    Marland Kitchen lisinopril (PRINIVIL,ZESTRIL) 40 MG tablet Take 40 mg by mouth daily.    .  rosuvastatin (CRESTOR) 20 MG tablet Take 20 mg by mouth daily.    . baclofen (LIORESAL) 10 MG tablet Take 0.5 tablets (5 mg total) by mouth 2 (two) times daily. (Patient not taking: Reported on 12/24/2015) 60 each 0  . ciprofloxacin (CIPRO) 500 MG tablet Take 1 tablet (500 mg total) by mouth 2 (two) times daily. (Patient not taking: Reported on 12/24/2015) 6 tablet 0  . lansoprazole (PREVACID) 30 MG capsule Take 1 capsule (30 mg total) by mouth daily at 12 noon. (Patient not taking: Reported on 12/24/2015) 14 capsule 0  . lisinopril (PRINIVIL,ZESTRIL) 20 MG tablet Take 1 tablet (20 mg total) by mouth daily. (Patient not taking: Reported on 12/24/2015) 30 tablet 0  . promethazine (PHENERGAN) 25 MG tablet Take 1 tablet (25 mg total) by mouth every 6 (six) hours as needed for nausea. (Patient not taking: Reported on 12/24/2015) 10 tablet 0  . venlafaxine (EFFEXOR) 75 MG tablet TAKE 1 TABLET (75 MG TOTAL) BY MOUTH DAILY. (Patient not taking: Reported on 12/24/2015) 30 tablet 0    Musculoskeletal: Strength & Muscle Tone: within normal limits Gait & Station: normal Patient leans: N/A  Psychiatric Specialty Exam: Physical Exam  Constitutional: She is oriented to person, place, and time. She appears well-developed and well-nourished.  HENT:  Head: Normocephalic.  Neck: Normal range of motion.  Respiratory: Effort normal.  Musculoskeletal: Normal range of motion.  Neurological: She is alert and oriented to person, place, and time.  Psychiatric: Her speech is normal and behavior is normal. Judgment and thought content normal. Her mood appears anxious. Cognition and memory are normal.    Review of Systems  Psychiatric/Behavioral: Positive for substance abuse. The patient is nervous/anxious.   All other systems reviewed and are negative.   Blood pressure (!) 158/86, pulse 88, temperature 98.8 F (37.1 C), temperature source Oral, resp. rate 16, SpO2 99 %.There is no height or weight on file to  calculate BMI.  General Appearance: Casual  Eye Contact:  Good  Speech:  Normal Rate  Volume:  Normal  Mood:  Anxious, mild  Affect:  Congruent  Thought Process:  Coherent and Descriptions of Associations: Intact  Orientation:  Full (Time, Place, and Person)  Thought Content:  WDL and Logical  Suicidal Thoughts:  No  Homicidal Thoughts:  No  Memory:  Immediate;   Good Recent;   Good Remote;   Good  Judgement:  Fair  Insight:  Fair  Psychomotor Activity:  Normal  Concentration:  Concentration: Good and Attention Span: Good  Recall:  Good  Fund of Knowledge:  Good  Language:  Good  Akathisia:  No  Handed:  Right  AIMS (if indicated):     Assets:  Housing Intimacy Leisure Time Physical Health Resilience Social Support  ADL's:  Intact  Cognition:  WNL  Sleep:        Treatment Plan Summary: Daily contact with patient to assess and evaluate symptoms and progress in treatment, Medication management and Plan alcohol abuse with alcohol induced mood disorder:  -Crisis stabilization -Medication management:  Medical medications started along with Cymbalta 60 mg daily for depression and started Gabapentin 100 mg BID PRN anxiety or withdrawal symptoms.  Ativan alcohol detox protocol used in the ED prior to admission. -Individual and substance abuse counseling  Disposition: No evidence of imminent risk to self or others at present.    Waylan Boga, NP 06/19/2016 11:12 AM  Patient seen face-to-face for psychiatric evaluation, chart reviewed and case discussed with the physician extender and developed treatment plan. Reviewed the information documented and agree with the treatment plan. Corena Pilgrim, MD

## 2016-06-19 NOTE — ED Notes (Addendum)
Hourly rounding reveals patient in room. No complaints, stable, in no acute distress. Q15 minute rounds and monitoring via [employer] to continue. 

## 2016-06-19 NOTE — [facility] Suicide Risk Assessment (Signed)
Suicide Risk Assessment  Discharge Assessment   Carla Bowen Discharge Suicide Risk Assessment   Principal Problem: Alcohol abuse with alcohol-induced mood disorder [facility]) Discharge Diagnoses:  Patient Active Problem List   Diagnosis Date Noted  . Alcohol abuse with alcohol-induced mood disorder ([facility]) [F10.14] 06/19/2016    Priority: High  . Alcohol abuse [F10.10] 04/13/2016    Priority: High  . Chest tightness [R07.89]   . History of stroke [Z86.73]   . Left hemiparesis ([facility]) [G81.94]   . Stroke [facility]) [I63.9]   . Chest pain [R07.9] 11/03/2014  . UTI (lower urinary tract infection) [N39.0] 11/03/2014  . HTN (hypertension) [I10] 11/03/2014  . ICH (intracerebral hemorrhage) ([facility]) [I61.9] 05/30/2013  . 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 Abnormality of gait [R26.9] 05/30/2013  . 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 Disturbance of skin sensation [R20.9] 05/30/2013    Total Time spent with patient: 45 minutes   Musculoskeletal: Strength & Muscle Tone: within normal limits Gait & Station: normal Patient leans: N/A  Psychiatric Specialty Exam: Physical Exam  Constitutional: She is oriented to person, place, and time. She appears well-developed and well-nourished.  HENT:  Head: Normocephalic.  Neck: Normal range of motion.  Respiratory: Effort normal.  Musculoskeletal: Normal range of motion.  Neurological: She is alert and oriented to person, place, and time.  Psychiatric: Her speech is normal and behavior is normal. Judgment and thought content normal. Her mood appears anxious. Cognition and memory are normal.    Review of Systems  Psychiatric/Behavioral: Positive for substance abuse. The patient is nervous/anxious.   All other systems reviewed and are negative.   Blood pressure (!) 158/86, pulse 88, temperature 98.8 F (37.1 C), temperature source Oral, resp. rate 16, SpO2 99 %.There is no height or weight on file to calculate BMI.   General Appearance: Casual  Eye Contact:  Good  Speech:  Normal Rate  Volume:  Normal  Mood:  Anxious, mild  Affect:  Congruent  Thought Process:  Coherent and Descriptions of Associations: Intact  Orientation:  Full (Time, Place, and Person)  Thought Content:  WDL and Logical  Suicidal Thoughts:  No  Homicidal Thoughts:  No  Memory:  Immediate;   Good Recent;   Good Remote;   Good  Judgement:  Fair  Insight:  Fair  Psychomotor Activity:  Normal  Concentration:  Concentration: Good and Attention Span: Good  Recall:  Good  Fund of Knowledge:  Good  Language:  Good  Akathisia:  No  Handed:  Right  AIMS (if indicated):     Assets:  Housing Intimacy Leisure Time Physical Health Resilience Social Support  ADL's:  Intact  Cognition:  WNL  Sleep:       Mental Status Per Nursing Assessment::   On Admission:   alcohol abuse  Demographic Factors:  Caucasian  Loss Factors: NA  Historical Factors: NA  Risk Reduction Factors:   Sense of responsibility to family, Living with another person, especially a relative, Positive social support and Positive therapeutic relationship  Continued Clinical Symptoms:  Anxiety, mild  Cognitive Features That Contribute To Risk:  None    Suicide Risk:  Minimal: No identifiable suicidal ideation.  Patients presenting with no risk factors but with morbid ruminations; may be classified as minimal risk based on the severity of the depressive symptoms    Plan Of Care/Follow-up recommendations:  Activity:  as tolerated Diet:  heart healhty diet  Cathlin Buchan, NP 06/19/2016, 11:18 AM

## 2016-06-19 NOTE — Progress Notes (Signed)
CSW filed patient's examination and recommendation paperwork into IVC logbook.  Cathe Bilger, LCSWA [facility]  Clinical Social Worker [phone] 

## 2016-06-21 ENCOUNTER — Other Ambulatory Visit: Payer: Self-pay | Admitting: Family Medicine

## 2016-06-21 DIAGNOSIS — R7989 Other specified abnormal findings of blood chemistry: Secondary | ICD-10-CM

## 2016-06-27 ENCOUNTER — Other Ambulatory Visit: Payer: Self-pay

## 2016-07-01 ENCOUNTER — Other Ambulatory Visit: Payer: Self-pay

## 2016-07-08 ENCOUNTER — Other Ambulatory Visit: Payer: Self-pay

## 2016-07-15 ENCOUNTER — Other Ambulatory Visit: Payer: Self-pay

## 2016-08-03 ENCOUNTER — Emergency Department (HOSPITAL_COMMUNITY): Payer: Medicare Other

## 2016-08-03 ENCOUNTER — Emergency Department (HOSPITAL_COMMUNITY)
Admission: EM | Admit: 2016-08-03 | Discharge: 2016-08-04 | Disposition: A | Discharge status: Home / Self Care | Payer: Medicare Other | Attending: Emergency Medicine | Admitting: Emergency Medicine

## 2016-08-03 ENCOUNTER — Encounter (HOSPITAL_COMMUNITY): Payer: Self-pay | Admitting: *Deleted

## 2016-08-03 DIAGNOSIS — F4325 Adjustment disorder with mixed disturbance of emotions and conduct: Secondary | ICD-10-CM | POA: Diagnosis not present

## 2016-08-03 DIAGNOSIS — F1010 Alcohol abuse, uncomplicated: Secondary | ICD-10-CM | POA: Diagnosis not present

## 2016-08-03 DIAGNOSIS — Y929 Unspecified place or not applicable: Secondary | ICD-10-CM | POA: Diagnosis not present

## 2016-08-03 DIAGNOSIS — Z8673 Personal history of transient ischemic attack (TIA), and cerebral infarction without residual deficits: Secondary | ICD-10-CM | POA: Insufficient documentation

## 2016-08-03 DIAGNOSIS — Z7982 Long term (current) use of aspirin: Secondary | ICD-10-CM | POA: Diagnosis not present

## 2016-08-03 DIAGNOSIS — W01198A Fall on same level from slipping, tripping and stumbling with subsequent striking against other object, initial encounter: Secondary | ICD-10-CM | POA: Diagnosis not present

## 2016-08-03 DIAGNOSIS — W19XXXA Unspecified fall, initial encounter: Secondary | ICD-10-CM

## 2016-08-03 DIAGNOSIS — Z79899 Other long term (current) drug therapy: Secondary | ICD-10-CM | POA: Diagnosis not present

## 2016-08-03 DIAGNOSIS — N390 Urinary tract infection, site not specified: Secondary | ICD-10-CM | POA: Diagnosis not present

## 2016-08-03 DIAGNOSIS — S0990XA Unspecified injury of head, initial encounter: Secondary | ICD-10-CM | POA: Diagnosis not present

## 2016-08-03 DIAGNOSIS — F17200 Nicotine dependence, unspecified, uncomplicated: Secondary | ICD-10-CM | POA: Insufficient documentation

## 2016-08-03 DIAGNOSIS — I10 Essential (primary) hypertension: Secondary | ICD-10-CM | POA: Insufficient documentation

## 2016-08-03 DIAGNOSIS — Y939 Activity, unspecified: Secondary | ICD-10-CM | POA: Insufficient documentation

## 2016-08-03 DIAGNOSIS — Y999 Unspecified external cause status: Secondary | ICD-10-CM | POA: Insufficient documentation

## 2016-08-03 DIAGNOSIS — F4329 Adjustment disorder with other symptoms: Secondary | ICD-10-CM | POA: Diagnosis present

## 2016-08-03 HISTORY — DX: Anxiety disorder, unspecified: F41.9

## 2016-08-03 LAB — URINALYSIS, ROUTINE W REFLEX MICROSCOPIC
Glucose, UA: NEGATIVE mg/dL
Hgb urine dipstick: NEGATIVE
Ketones, ur: 5 mg/dL — AB
Nitrite: NEGATIVE
Protein, ur: 30 mg/dL — AB
Specific Gravity, Urine: 1.021 (ref 1.005–1.030)
pH: 5 (ref 5.0–8.0)

## 2016-08-03 LAB — CBC WITH DIFFERENTIAL/PLATELET
Basophils Absolute: 0 10*3/uL (ref 0.0–0.1)
Basophils Relative: 0 %
Eosinophils Absolute: 0.1 10*3/uL (ref 0.0–0.7)
Eosinophils Relative: 2 %
HCT: 41.8 % (ref 36.0–46.0)
Hemoglobin: 14.3 g/dL (ref 12.0–15.0)
Lymphocytes Relative: 32 %
Lymphs Abs: 2.2 10*3/uL (ref 0.7–4.0)
MCH: 33.1 pg (ref 26.0–34.0)
MCHC: 34.2 g/dL (ref 30.0–36.0)
MCV: 96.8 fL (ref 78.0–100.0)
Monocytes Absolute: 0.6 10*3/uL (ref 0.1–1.0)
Monocytes Relative: 9 %
Neutro Abs: 4 10*3/uL (ref 1.7–7.7)
Neutrophils Relative %: 57 %
Platelets: 245 10*3/uL (ref 150–400)
RBC: 4.32 MIL/uL (ref 3.87–5.11)
RDW: 13.3 % (ref 11.5–15.5)
WBC: 6.9 10*3/uL (ref 4.0–10.5)

## 2016-08-03 LAB — COMPREHENSIVE METABOLIC PANEL
ALT: 29 U/L (ref 14–54)
AST: 35 U/L (ref 15–41)
Albumin: 4.5 g/dL (ref 3.5–5.0)
Alkaline Phosphatase: 78 U/L (ref 38–126)
Anion gap: 10 (ref 5–15)
BUN: 34 mg/dL — ABNORMAL HIGH (ref 6–20)
CO2: 28 mmol/L (ref 22–32)
Calcium: 9.7 mg/dL (ref 8.9–10.3)
Chloride: 101 mmol/L (ref 101–111)
Creatinine, Ser: 1.21 mg/dL — ABNORMAL HIGH (ref 0.44–1.00)
GFR calc Af Amer: 56 mL/min — ABNORMAL LOW (ref >60)
GFR calc non Af Amer: 48 mL/min — ABNORMAL LOW (ref >60)
Glucose, Bld: 137 mg/dL — ABNORMAL HIGH (ref 65–99)
Potassium: 3.3 mmol/L — ABNORMAL LOW (ref 3.5–5.1)
Sodium: 139 mmol/L (ref 135–145)
Total Bilirubin: 0.8 mg/dL (ref 0.3–1.2)
Total Protein: 8.1 g/dL (ref 6.5–8.1)

## 2016-08-03 LAB — PREGNANCY, URINE: Preg Test, Ur: NEGATIVE

## 2016-08-03 LAB — RAPID URINE DRUG SCREEN, HOSP PERFORMED
Amphetamines: NOT DETECTED
Barbiturates: NOT DETECTED
Benzodiazepines: NOT DETECTED
Cocaine: NOT DETECTED
Opiates: NOT DETECTED
Tetrahydrocannabinol: NOT DETECTED

## 2016-08-03 LAB — MAGNESIUM: Magnesium: 2.1 mg/dL (ref 1.7–2.4)

## 2016-08-03 LAB — ETHANOL: Alcohol, Ethyl (B): <5 mg/dL (ref <5)

## 2016-08-03 MED ORDER — CEFTRIAXONE SODIUM 1 G IJ SOLR
1.0000 g | Freq: Once | INTRAMUSCULAR | Status: AC
  Administered 2016-08-03: 1 g via INTRAMUSCULAR
  Filled 2016-08-03: qty 10

## 2016-08-03 MED ORDER — AMLODIPINE BESYLATE 5 MG PO TABS
10.0000 mg | ORAL_TABLET | Freq: Every day | ORAL | Status: DC
  Filled 2016-08-03: qty 2

## 2016-08-03 MED ORDER — VITAMIN B-1 100 MG PO TABS
100.0000 mg | ORAL_TABLET | Freq: Every day | ORAL | Status: DC
  Administered 2016-08-03 – 2016-08-04 (×2): 100 mg via ORAL
  Filled 2016-08-03 (×2): qty 1

## 2016-08-03 MED ORDER — IBUPROFEN 200 MG PO TABS
600.0000 mg | ORAL_TABLET | Freq: Three times a day (TID) | ORAL | Status: DC | PRN
  Administered 2016-08-03: 600 mg via ORAL
  Filled 2016-08-03: qty 3

## 2016-08-03 MED ORDER — TRAZODONE HCL 50 MG PO TABS: 50.0000 mg | ORAL_TABLET | Freq: Every evening | ORAL | Status: DC | PRN

## 2016-08-03 MED ORDER — ROSUVASTATIN CALCIUM 20 MG PO TABS
20.0000 mg | ORAL_TABLET | Freq: Every day | ORAL | Status: DC
  Administered 2016-08-04: 20 mg via ORAL
  Filled 2016-08-03: qty 1

## 2016-08-03 MED ORDER — LORAZEPAM 2 MG/ML IJ SOLN: 0.0000 mg | Freq: Two times a day (BID) | INTRAMUSCULAR | Status: DC

## 2016-08-03 MED ORDER — LORAZEPAM 1 MG PO TABS: 0.0000 mg | ORAL_TABLET | Freq: Two times a day (BID) | ORAL | Status: DC

## 2016-08-03 MED ORDER — CEPHALEXIN 500 MG PO CAPS
500.0000 mg | ORAL_CAPSULE | Freq: Four times a day (QID) | ORAL | Status: DC
  Administered 2016-08-03 – 2016-08-04 (×3): 500 mg via ORAL
  Filled 2016-08-03 (×3): qty 1

## 2016-08-03 MED ORDER — LORAZEPAM 2 MG/ML IJ SOLN: 0.0000 mg | Freq: Four times a day (QID) | INTRAMUSCULAR | Status: DC

## 2016-08-03 MED ORDER — THIAMINE HCL 100 MG/ML IJ SOLN: 100.0000 mg | Freq: Every day | INTRAMUSCULAR | Status: DC

## 2016-08-03 MED ORDER — AZELASTINE HCL 0.1 % NA SOLN: 2.0000 | NASAL | Status: DC

## 2016-08-03 MED ORDER — PANTOPRAZOLE SODIUM 20 MG PO TBEC
20.0000 mg | DELAYED_RELEASE_TABLET | Freq: Every day | ORAL | Status: DC
  Administered 2016-08-04: 20 mg via ORAL
  Filled 2016-08-03: qty 1

## 2016-08-03 MED ORDER — CYCLOSPORINE 0.05 % OP EMUL
1.0000 [drp] | Freq: Two times a day (BID) | OPHTHALMIC | Status: DC
  Administered 2016-08-04: 1 [drp] via OPHTHALMIC
  Filled 2016-08-03 (×2): qty 1

## 2016-08-03 MED ORDER — ARIPIPRAZOLE 2 MG PO TABS
2.0000 mg | ORAL_TABLET | Freq: Every day | ORAL | Status: DC
  Administered 2016-08-04: 2 mg via ORAL
  Filled 2016-08-03: qty 1

## 2016-08-03 MED ORDER — LORAZEPAM 1 MG PO TABS
0.0000 mg | ORAL_TABLET | Freq: Four times a day (QID) | ORAL | Status: DC
  Administered 2016-08-03: 1 mg via ORAL
  Filled 2016-08-03 (×2): qty 1

## 2016-08-03 MED ORDER — LIDOCAINE HCL 1 % IJ SOLN
INTRAMUSCULAR | Status: AC
  Administered 2016-08-03: 2.1 mL
  Filled 2016-08-03: qty 20

## 2016-08-03 MED ORDER — NICOTINE 21 MG/24HR TD PT24
21.0000 mg | MEDICATED_PATCH | Freq: Every day | TRANSDERMAL | Status: DC
Start: 2016-08-03 — End: 2016-08-04
  Administered 2016-08-03 – 2016-08-04 (×2): 21 mg via TRANSDERMAL
  Filled 2016-08-03 (×2): qty 1

## 2016-08-03 MED ORDER — LISINOPRIL 20 MG PO TABS
40.0000 mg | ORAL_TABLET | Freq: Every day | ORAL | Status: DC
  Filled 2016-08-03: qty 2

## 2016-08-03 MED ORDER — ASPIRIN EC 81 MG PO TBEC
81.0000 mg | DELAYED_RELEASE_TABLET | Freq: Every day | ORAL | Status: DC
  Administered 2016-08-04: 81 mg via ORAL
  Filled 2016-08-03: qty 1
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
## 2016-08-03 NOTE — ED Notes (Signed)
Bed: WA27 Expected date:  Expected time:  Means of arrival:  Comments: F noncombative

## 2016-08-03 NOTE — ED Notes (Signed)
Patient and belonging wanded
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
## 2016-08-04 MED ORDER — PHENAZOPYRIDINE HCL 200 MG PO TABS: 200.0000 mg | ORAL_TABLET | Freq: Three times a day (TID) | ORAL | 0 refills | Status: DC | PRN

## 2016-08-04 MED ORDER — CEPHALEXIN 500 MG PO CAPS: 500.0000 mg | ORAL_CAPSULE | Freq: Three times a day (TID) | ORAL | 0 refills | Status: DC

## 2016-08-04 MED ORDER — PHENAZOPYRIDINE HCL 200 MG PO TABS
200.0000 mg | ORAL_TABLET | Freq: Three times a day (TID) | ORAL | Status: DC
  Administered 2016-08-04: 200 mg via ORAL
  Filled 2016-08-04: qty 1

## 2016-08-04 NOTE — Discharge Instructions (Signed)
To help you maintain a sober lifestyle, a substance abuse treatment program may be beneficial to you.  The following providers offer Chemical Dependency Intensive Outpatient Programs (CD-IOP).  These programs meet several hours a day, several times a week.  Contact one of them at your earliest opportunity to ask about enrolling in their program:       [facility]      [address]      [place], [place] [address]      [phone]      New patients are seen at the walk-in clinic every Tuesday from 9:00 am - 12:00 pm       [facility]      [address]      [place], [place] [address]      Contact person: Charmian Muffnn Evans, LCAS      [phone]       [facility]      [address]      [place], [place] [address]      [phone]
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
## 2016-08-04 NOTE — ED Notes (Signed)
Awaiting TTS teleconference call.  TV in room

## 2016-08-04 NOTE — ED Notes (Signed)
TTS at bedside. 
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
## 2016-08-04 NOTE — BH Assessment (Signed)
Assessment Note  Carla Bowen is an 61 y.o. female.   Diagnosis: Depressive Disorder; Anxiety Disorder; Alcohol Use Disorder  Past Medical History:  Past Medical History:  Diagnosis Date  . Anxiety   . Depression   . Fibromyalgia   . High cholesterol   . Hypertension   . Stroke [facility](HCC)     Past Surgical History:  Procedure Laterality Date  . CESAREAN SECTION      Family History:  Family History  Problem Relation Age of Onset  . Lung cancer Father   . Diabetes Brother   . Heart attack Brother   . Hypertension Brother   . Hypertension Sister     Social History:  reports that she has been smoking.  She has a 10.00 pack-year smoking history. She has never used smokeless tobacco. She reports that she drinks about 6.0 oz of alcohol per week . She reports that she does not use drugs.  Additional Social History:     CIWA: CIWA-Ar BP: 96/64 Pulse Rate: 86 Nausea and Vomiting: no nausea and no vomiting Tactile Disturbances: none Tremor: no tremor Auditory Disturbances: not present Paroxysmal Sweats: no sweat visible Visual Disturbances: not present Anxiety: moderately anxious, or guarded, so anxiety is inferred Headache, Fullness in Head: mild Agitation: normal activity Orientation and Clouding of Sensorium: oriented and can do serial additions CIWA-Ar Total: 6 COWS:    Allergies:  Allergies  Allergen Reactions  . Ciprofloxacin Itching, Nausea And Vomiting and Rash  . Codeine Hives  . Sulfa Antibiotics Hives    Home Medications:  (Not in a hospital admission)  OB/GYN Status:  No LMP recorded (lmp unknown). Patient is postmenopausal.  General Assessment Data Admission Status: Involuntary           Risk to self with the past 6 months Is patient at risk for suicide?: No Substance abuse history and/or treatment for substance abuse?: Yes        Mental Status Report Motor Activity: Freedom of movement                             Advance Directives (For Healthcare) Does Patient Have a Medical Advance Directive?: No Would patient like information on creating a medical advance directive?: No - Patient declined Nutrition Screen- [facility] Adult/WL/AP Patient's home diet: Regular        Disposition:     On Site Evaluation by:   Reviewed with Physician:    Carla Bowen 08/04/2016 9:42 AM
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
## 2016-08-18 ENCOUNTER — Ambulatory Visit: Payer: Self-pay | Admitting: Neurology

## 2017-02-20 ENCOUNTER — Encounter (HOSPITAL_COMMUNITY): Payer: Self-pay | Admitting: Emergency Medicine

## 2017-02-20 ENCOUNTER — Emergency Department (HOSPITAL_COMMUNITY): Payer: Medicare Other

## 2017-02-20 ENCOUNTER — Other Ambulatory Visit: Payer: Self-pay

## 2017-02-20 ENCOUNTER — Emergency Department (HOSPITAL_COMMUNITY)
Admission: EM | Admit: 2017-02-20 | Discharge: 2017-02-20 | Discharge status: Against Medical Advice | Payer: Medicare Other | Attending: Emergency Medicine | Admitting: Emergency Medicine

## 2017-02-20 DIAGNOSIS — Z8673 Personal history of transient ischemic attack (TIA), and cerebral infarction without residual deficits: Secondary | ICD-10-CM | POA: Insufficient documentation

## 2017-02-20 DIAGNOSIS — R1032 Left lower quadrant pain: Secondary | ICD-10-CM | POA: Insufficient documentation

## 2017-02-20 DIAGNOSIS — R197 Diarrhea, unspecified: Secondary | ICD-10-CM | POA: Diagnosis not present

## 2017-02-20 DIAGNOSIS — R29810 Facial weakness: Secondary | ICD-10-CM | POA: Diagnosis not present

## 2017-02-20 DIAGNOSIS — R202 Paresthesia of skin: Secondary | ICD-10-CM | POA: Insufficient documentation

## 2017-02-20 DIAGNOSIS — Z7982 Long term (current) use of aspirin: Secondary | ICD-10-CM | POA: Insufficient documentation

## 2017-02-20 DIAGNOSIS — Z79899 Other long term (current) drug therapy: Secondary | ICD-10-CM | POA: Insufficient documentation

## 2017-02-20 DIAGNOSIS — R5383 Other fatigue: Secondary | ICD-10-CM | POA: Diagnosis not present

## 2017-02-20 DIAGNOSIS — F17200 Nicotine dependence, unspecified, uncomplicated: Secondary | ICD-10-CM | POA: Insufficient documentation

## 2017-02-20 DIAGNOSIS — R531 Weakness: Secondary | ICD-10-CM | POA: Insufficient documentation

## 2017-02-20 DIAGNOSIS — I10 Essential (primary) hypertension: Secondary | ICD-10-CM | POA: Diagnosis not present

## 2017-02-20 DIAGNOSIS — R29818 Other symptoms and signs involving the nervous system: Secondary | ICD-10-CM | POA: Diagnosis not present

## 2017-02-20 DIAGNOSIS — R10814 Left lower quadrant abdominal tenderness: Secondary | ICD-10-CM | POA: Diagnosis not present

## 2017-02-20 LAB — COMPREHENSIVE METABOLIC PANEL
ALT: 19 U/L (ref 14–54)
AST: 26 U/L (ref 15–41)
Albumin: 3.6 g/dL (ref 3.5–5.0)
Alkaline Phosphatase: 84 U/L (ref 38–126)
Anion gap: 9 (ref 5–15)
BUN: 20 mg/dL (ref 6–20)
CO2: 24 mmol/L (ref 22–32)
Calcium: 8.5 mg/dL — ABNORMAL LOW (ref 8.9–10.3)
Chloride: 105 mmol/L (ref 101–111)
Creatinine, Ser: 0.74 mg/dL (ref 0.44–1.00)
GFR calc Af Amer: >60 mL/min (ref >60)
GFR calc non Af Amer: >60 mL/min (ref >60)
Glucose, Bld: 101 mg/dL — ABNORMAL HIGH (ref 65–99)
Potassium: 3.5 mmol/L (ref 3.5–5.1)
Sodium: 138 mmol/L (ref 135–145)
Total Bilirubin: 0.4 mg/dL (ref 0.3–1.2)
Total Protein: 6.5 g/dL (ref 6.5–8.1)

## 2017-02-20 LAB — CBC WITH DIFFERENTIAL/PLATELET
Basophils Absolute: 0 10*3/uL (ref 0.0–0.1)
Basophils Relative: 0 %
Eosinophils Absolute: 0.1 10*3/uL (ref 0.0–0.7)
Eosinophils Relative: 3 %
HCT: 41.6 % (ref 36.0–46.0)
Hemoglobin: 13.9 g/dL (ref 12.0–15.0)
Lymphocytes Relative: 42 %
Lymphs Abs: 2.3 10*3/uL (ref 0.7–4.0)
MCH: 31.8 pg (ref 26.0–34.0)
MCHC: 33.4 g/dL (ref 30.0–36.0)
MCV: 95.2 fL (ref 78.0–100.0)
Monocytes Absolute: 0.3 10*3/uL (ref 0.1–1.0)
Monocytes Relative: 6 %
Neutro Abs: 2.8 10*3/uL (ref 1.7–7.7)
Neutrophils Relative %: 49 %
Platelets: 249 10*3/uL (ref 150–400)
RBC: 4.37 MIL/uL (ref 3.87–5.11)
RDW: 12.8 % (ref 11.5–15.5)
WBC: 5.6 10*3/uL (ref 4.0–10.5)

## 2017-02-20 LAB — LIPASE, BLOOD: Lipase: 21 U/L (ref 11–51)

## 2017-02-20 LAB — URINALYSIS, ROUTINE W REFLEX MICROSCOPIC
Bilirubin Urine: NEGATIVE
Glucose, UA: NEGATIVE mg/dL
Ketones, ur: NEGATIVE mg/dL
Leukocytes, UA: NEGATIVE
Nitrite: NEGATIVE
Protein, ur: NEGATIVE mg/dL
Specific Gravity, Urine: >1.046 — ABNORMAL HIGH (ref 1.005–1.030)
pH: 8 (ref 5.0–8.0)

## 2017-02-20 LAB — I-STAT CG4 LACTIC ACID, ED
Lactic Acid, Venous: 2.41 mmol/L (ref 0.5–1.9)
Lactic Acid, Venous: 1.36 mmol/L (ref 0.5–1.9)

## 2017-02-20 MED ORDER — LACTATED RINGERS IV BOLUS (SEPSIS)
1000.0000 mL | Freq: Once | INTRAVENOUS | Status: AC
  Administered 2017-02-20: 1000 mL via INTRAVENOUS

## 2017-02-20 MED ORDER — IOPAMIDOL (ISOVUE-300) INJECTION 61%
INTRAVENOUS | Status: AC
  Administered 2017-02-20: 100 mL
  Filled 2017-02-20: qty 100

## 2017-02-20 NOTE — Discharge Instructions (Signed)
Please return to the ED if you have worsening of your symptoms or any other concerns. Please follow up with your neurologist at the next available appointment.

## 2017-02-20 NOTE — ED Notes (Addendum)
Pt aware leaving AMA will delay care and is advised of possible risk to self.  Pt still refuses treatment.

## 2017-02-20 NOTE — ED Triage Notes (Signed)
Per EMS: pt from home with c/o of left sided weakness, numbness, and diarrhea.  LKW 10:00 02/19/17.  Pt has baseline left sided weakness from a previous stroke in 2014.  PTA vitals: 180/100 BP, 88 HR, 156 CBG, 98 % on RA.

## 2017-02-20 NOTE — ED Notes (Signed)
Wheeled pt to restroom

## 2017-02-20 NOTE — ED Provider Notes (Signed)
[facility] EMERGENCY DEPARTMENT Provider Note   CSN: [id number] Arrival date & time: 02/20/17  1631     History   Chief Complaint Chief Complaint  Patient presents with  . Weakness  . Left Sided Weakness     HPI Carla Bowen is a 61 y.o. female.  HPI 61 year old female with a history of CVA with baseline deficits of left-sided weakness, tingling of her left hand, sensation deficit on the left side and spasticity of the left arm presenting with worsening stroke symptoms, generalized fatigue and diarrhea.  She states her symptoms started yesterday morning at 10 AM and has been progressively worsening.  She states the tingling in her hand has now progressed to her left arm and left face.  She states her weakness has gradually worsened over this time and her spasticity of the left arm has worsened.  She also endorses multiple episodes of watery nonbloody diarrhea.  Denies fevers, chills, headache, neck pain, chest pain, shortness of breath, cough, terminal pain, nausea, vomiting.  No recent falls or trauma.  No alleviating or exacerbating factors.  Past Medical History:  Diagnosis Date  . Anxiety   . Depression   . Fibromyalgia   . High cholesterol   . Hypertension   . Stroke [facility])     Patient Active Problem List   Diagnosis Date Noted  . Alcohol use disorder, severe, dependence (HCC) 06/19/2016  . 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/13/2016  . Chest tightness   . History of stroke   . Left hemiparesis (H[facility])   . Stroke ([facility])   . Chest pain 11/03/2014  . UTI (lower urinary tract infection) 11/03/2014  . HTN (hypertension) 11/03/2014  . ICH (intracerebral hemorrhage) (HCC) 05/30/2013  . Spastic hemiplegia affecting nondominant side (HCC) 05/30/2013  . Abnormality of gait 05/30/2013  . 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 Disturbance of skin sensation 05/30/2013    Past Surgical History:  Procedure Laterality Date  . CESAREAN SECTION       OB History    Gravida Para Term Preterm AB Living   1 1 1          SAB TAB Ectopic Multiple Live Births                   Home Medications    Prior to Admission medications   Medication Sig Start Date End Date Taking? Authorizing Provider  amLODipine (NORVASC) 10 MG tablet Take 1 tablet (10 mg total) by mouth daily. Patient taking differently: Take 10 mg by mouth at bedtime.  06/25/13  Yes Micki Riley, MD  aspirin EC 81 MG tablet Take 81 mg by mouth daily.   Yes [provider]  chlorthalidone (HYGROTON) 25 MG tablet Take 25 mg by mouth at bedtime.   Yes [provider]  cycloSPORINE (RESTASIS) 0.05 % ophthalmic emulsion Place 1 drop into both eyes 2 (two) times daily.   Yes [provider]  DULoxetine (CYMBALTA) 60 MG capsule Take 1 capsule (60 mg total) by mouth daily. Patient taking differently: Take 30 mg by mouth at bedtime.  06/19/16  Yes Lord, Herminio Heads, NP  fluticasone (FLONASE) 50 MCG/ACT nasal spray Place 2 sprays into both nostrils 2 (two) times daily. 02/01/17  Yes [provider]  gabapentin (NEURONTIN) 100 MG capsule Take 1 capsule (100 mg total) by mouth 2 (two) times daily as needed (neuropathetic pain). Patient taking differently: Take 100 mg by mouth at bedtime.  06/19/16  Yes  Charm RingsLord, Jamison Y, NP  lisinopril (PRINIVIL,ZESTRIL) 20 MG tablet Take 1 tablet (20 mg total) by mouth daily. Patient taking differently: Take 40 mg by mouth at bedtime.  09/23/13  Yes Micki RileySethi, Pramod S, MD  rosuvastatin (CRESTOR) 10 MG tablet Take 10 mg by mouth at bedtime.    Yes [provider]  baclofen (LIORESAL) 10 MG tablet Take 0.5 tablets (5 mg total) by mouth 2 (two) times daily. Patient not taking: Reported on 12/24/2015 11/04/14   Clydia LlanoElmahi, Mutaz, MD  lansoprazole (PREVACID) 30 MG capsule Take 1 capsule (30 mg total) by mouth daily at 12 noon. Patient not taking: Reported on 02/20/2017 08/08/13   Benjiman CorePickering, Cynia Abruzzo, MD  promethazine (PHENERGAN)  25 MG tablet Take 1 tablet (25 mg total) by mouth every 6 (six) hours as needed for nausea. Patient not taking: Reported on 12/24/2015 08/08/13   Benjiman CorePickering, Antone Summons, MD  venlafaxine (EFFEXOR) 75 MG tablet TAKE 1 TABLET (75 MG TOTAL) BY MOUTH DAILY. Patient not taking: Reported on 12/24/2015    Micki RileySethi, Pramod S, MD    Family History Family History  Problem Relation Age of Onset  . Lung cancer Father   . Diabetes Brother   . Heart attack Brother   . Hypertension Brother   . Hypertension Sister     Social History Social History   Tobacco Use  . Smoking status: Current Every Day Smoker    Packs/day: 0.50    Years: 20.00    Pack years: 10.00  . Smokeless tobacco: Never Used  Substance Use Topics  . Alcohol use: Yes    Alcohol/week: 6.0 oz    Types: 5 Cans of beer, 5 Shots of liquor per week  . Drug use: No     Allergies   Ciprofloxacin; Codeine; and Sulfa antibiotics   Review of Systems Review of Systems  Constitutional: Positive for fatigue. Negative for chills and fever.  HENT: Negative for ear pain and sore throat.   Eyes: Negative for pain and visual disturbance.  Respiratory: Negative for cough and shortness of breath.   Cardiovascular: Negative for chest pain and palpitations.  Gastrointestinal: Positive for diarrhea. Negative for abdominal pain and vomiting.  Genitourinary: Negative for dysuria and hematuria.  Musculoskeletal: Positive for gait problem. Negative for arthralgias and back pain.  Skin: Negative for color change and rash.  Neurological: Positive for weakness and numbness. Negative for seizures, syncope, speech difficulty and headaches.  All other systems reviewed and are negative.    Physical Exam Updated Vital Signs BP (!) 187/92   Pulse 77   Temp 99.1 F (37.3 C) (Oral)   Resp 18   LMP  (LMP Unknown)   SpO2 95%   Physical Exam  Constitutional: She is oriented to person, place, and time. She appears well-developed and well-nourished. No  distress.  HENT:  Head: Normocephalic and atraumatic.  Eyes: Conjunctivae are normal.  Neck: Neck supple.  Cardiovascular: Normal rate and regular rhythm.  No murmur heard. Pulmonary/Chest: Effort normal and breath sounds normal. No respiratory distress.  Abdominal: Soft. There is tenderness in the left lower quadrant. There is no rigidity, no rebound, no guarding, no CVA tenderness, no tenderness at McBurney's point and negative Murphy's sign.  Musculoskeletal: She exhibits no edema.  Neurological: She is alert and oriented to person, place, and time. GCS eye subscore is 4. GCS verbal subscore is 5. GCS motor subscore is 6.  Reflex Scores:      Patellar reflexes are 2+ on the right side and 3+  on the left side.      Achilles reflexes are 2+ on the right side and 2+ on the left side. Mild left sided facial droop. Sensation intact throughout but decreased on left side. 5/5 strength in RUE and RLE. 3/5 strength in LUE and 4/5 strength in LLE. Loss of coordination in LUE with spastic movements. Gait abnormal with limp from LLE weakness but pt states it is her baseline. Not ataxic.  Skin: Skin is warm and dry.  Psychiatric: She has a normal mood and affect.  Nursing note and vitals reviewed.    ED Treatments / Results  Labs (all labs ordered are listed, but only abnormal results are displayed) Labs Reviewed  COMPREHENSIVE METABOLIC PANEL - Abnormal; Notable for the following components:      Result Value   Glucose, Bld 101 (*)    Calcium 8.5 (*)    All other components within normal limits  URINALYSIS, ROUTINE W REFLEX MICROSCOPIC - Abnormal; Notable for the following components:   Color, Urine STRAW (*)    Specific Gravity, Urine >1.046 (*)    Hgb urine dipstick SMALL (*)    Bacteria, UA RARE (*)    Squamous Epithelial / LPF 0-5 (*)    All other components within normal limits  I-STAT CG4 LACTIC ACID, ED - Abnormal; Notable for the following components:   Lactic Acid, Venous 2.41  (*)    All other components within normal limits  CBC WITH DIFFERENTIAL/PLATELET  LIPASE, BLOOD  I-STAT CG4 LACTIC ACID, ED    EKG  EKG Interpretation None       Radiology Ct Head Wo Contrast  Result Date: 02/20/2017 CLINICAL DATA:  Full neurological deficit of greater than 6 hours duration. Left-sided weakness. Last seen normal, 10 a.m. 02/19/2017. EXAM: CT HEAD WITHOUT CONTRAST TECHNIQUE: Contiguous axial images were obtained from the base of the skull through the vertex without intravenous contrast. COMPARISON:  08/03/2016 FINDINGS: Brain: There is no intracranial hemorrhage, mass or evidence of acute infarction. Remote right thalamic infarction, unchanged. Gray matter and white matter are otherwise normal. Cerebral volume is normal for age. Brainstem and posterior fossa are unremarkable. The CSF spaces appear normal. There is no extra-axial fluid collection. Vascular: No hyperdense vessel or unexpected calcification. Skull: Normal. Negative for fracture or focal lesion. Sinuses/Orbits: No acute finding. Other: None. IMPRESSION: Remote right colonic infarction. Otherwise normal brain. No acute findings. Electronically Signed   By: Ellery Plunk M.D.   On: 02/20/2017 19:30   Ct Abdomen Pelvis W Contrast  Result Date: 02/20/2017 CLINICAL DATA:  Left-sided abdominal pain with diarrhea. EXAM: CT ABDOMEN AND PELVIS WITH CONTRAST TECHNIQUE: Multidetector CT imaging of the abdomen and pelvis was performed using the standard protocol following bolus administration of intravenous contrast. CONTRAST:  ISOVUE-300 IOPAMIDOL (ISOVUE-300) INJECTION 61% COMPARISON:  08/08/2013 FINDINGS: Lower chest:  Unremarkable. Hepatobiliary: No focal abnormality within the liver parenchyma. There is no evidence for gallstones, gallbladder wall thickening, or pericholecystic fluid. No intrahepatic or extrahepatic biliary dilation. Pancreas: No focal mass lesion. No dilatation of the main duct. No  intraparenchymal cyst. No peripancreatic edema. Spleen: No splenomegaly. No focal mass lesion. Adrenals/Urinary Tract: No adrenal nodule or mass. Kidneys unremarkable. No evidence for hydroureter. The urinary bladder appears normal for the degree of distention. Stomach/Bowel: Stomach is nondistended. No gastric wall thickening. No evidence of outlet obstruction. Duodenum is normally positioned as is the ligament of Treitz. No small bowel wall thickening. No small bowel dilatation. The terminal ileum is normal. The  appendix is normal. No gross colonic mass. No colonic wall thickening. No substantial diverticular change. Vascular/Lymphatic: There is abdominal aortic atherosclerosis without aneurysm. There is no gastrohepatic or hepatoduodenal ligament lymphadenopathy. No intraperitoneal or retroperitoneal lymphadenopathy. No pelvic sidewall lymphadenopathy. Reproductive: The uterus has normal CT imaging appearance. There is no adnexal mass. Other: No intraperitoneal free fluid. Musculoskeletal: Bone windows reveal no worrisome lytic or sclerotic osseous lesions. IMPRESSION: 1. No acute findings in the abdomen or pelvis. Specifically, no findings to explain the patient's history of left-sided abdominal pain and diarrhea. 2.  Aortic Atherosclerois (ICD10-170.0) Electronically Signed   By: Kennith CenterEric  Mansell M.D.   On: 02/20/2017 19:37    Procedures Procedures (including critical care time)  Medications Ordered in ED Medications  lactated ringers bolus 1,000 mL (0 mLs Intravenous Stopped 02/20/17 1939)  iopamidol (ISOVUE-300) 61 % injection (100 mLs  Contrast Given 02/20/17 1859)     Initial Impression / Assessment and Plan / ED Course  I have reviewed the triage vital signs and the nursing notes.  Pertinent labs & imaging results that were available during my care of the patient were reviewed by me and considered in my medical decision making (see chart for details).     61 year old female with the above  history significant for history of stroke with left-sided deficits as described above presenting with worsening of these deficits since 10 AM yesterday.  Denies recent fevers.  Endorses diarrhea for the past 2 days.  She is afebrile and hemodynamically stable although hypertensive.  Denies chest pain shortness of breath or cough.  Denies dysuria.  Exam notable for weakness of the left upper extremity and lower extremity, left facial droop, spasticity of the left upper extremity and decreased sensation in the left side of her face and arm, as well as left lower quadrant pain.  Patient has a history of diverticulitis and has left lower quadrant pain therefore will get CT abdomen/pelvis for concern of diverticulitis/colitis versus enteritis.  CT head ordered due to worsening deficits of previous stroke.  CBC, CMP, lipase, lactic acid ordered.  CBC, CMP, lipase unremarkable.  Lactic acid 2.4 likely from her diarrhea.  Fluid bolus given.  CT head shows prior stroke but no acute changes.  CT abdomen shows no signs of diverticulitis or colitis or other acute intra-abdominal pathology.  UA ordered to rule out UTI as infection would be the most likely cause of redemonstration of old stroke symptoms.  UA negative for infection.  I spoke with Dr. Wilford CornerArora with neurology who recommended an MRI to rule out new stroke and if it is negative she can be discharged.  If the MRI shows a new stroke she will need a formal stroke workup.  I discussed the MRI with the patient and she is declining the MRI and states that she wants to go home.  I expressed to her that with her symptoms may persist for, worsen and may lead to death if she does not have her symptoms evaluated appropriately.  She states that she is okay with the risk and wants to go home and she will follow-up with her neurologist outpatient.  Patient was discharged AMA.  Return precautions given.  Final Clinical Impressions(s) / ED Diagnoses   Final diagnoses:  Weakness     ED Discharge Orders    None       Ousman Dise Italyhad, MD 02/20/17 2149    Clarene DukeLittle, Ambrose Finlandachel Morgan, MD 02/21/17 [phone]

## 2017-02-20 NOTE — ED Notes (Signed)
Patient given bag meal, with drink. 

## 2017-04-20 ENCOUNTER — Other Ambulatory Visit: Payer: Self-pay | Admitting: Family Medicine

## 2017-04-20 DIAGNOSIS — M5416 Radiculopathy, lumbar region: Secondary | ICD-10-CM

## 2017-04-29 ENCOUNTER — Ambulatory Visit
Admission: RE | Admit: 2017-04-29 | Discharge: 2017-04-29 | Disposition: A | Discharge status: Home / Self Care | Payer: Medicare Other | Source: Ambulatory Visit | Attending: Family Medicine | Admitting: Family Medicine

## 2017-04-29 DIAGNOSIS — M5416 Radiculopathy, lumbar region: Secondary | ICD-10-CM

## 2017-04-29 MED ORDER — GADOBENATE DIMEGLUMINE 529 MG/ML IV SOLN
10.0000 mL | Freq: Once | INTRAVENOUS | Status: AC | PRN
  Administered 2017-04-29: 10 mL via INTRAVENOUS

## 2017-05-24 ENCOUNTER — Emergency Department (HOSPITAL_COMMUNITY): Payer: Medicare Other

## 2017-05-24 ENCOUNTER — Emergency Department (HOSPITAL_COMMUNITY)
Admission: EM | Admit: 2017-05-24 | Discharge: 2017-05-24 | Disposition: A | Discharge status: Home / Self Care | Payer: Medicare Other | Attending: Emergency Medicine | Admitting: Emergency Medicine

## 2017-05-24 DIAGNOSIS — F17210 Nicotine dependence, cigarettes, uncomplicated: Secondary | ICD-10-CM | POA: Diagnosis not present

## 2017-05-24 DIAGNOSIS — I10 Essential (primary) hypertension: Secondary | ICD-10-CM | POA: Insufficient documentation

## 2017-05-24 DIAGNOSIS — E876 Hypokalemia: Secondary | ICD-10-CM | POA: Diagnosis not present

## 2017-05-24 DIAGNOSIS — I69354 Hemiplegia and hemiparesis following cerebral infarction affecting left non-dominant side: Secondary | ICD-10-CM | POA: Diagnosis not present

## 2017-05-24 DIAGNOSIS — R072 Precordial pain: Secondary | ICD-10-CM | POA: Insufficient documentation

## 2017-05-24 DIAGNOSIS — R079 Chest pain, unspecified: Secondary | ICD-10-CM | POA: Diagnosis present

## 2017-05-24 LAB — BASIC METABOLIC PANEL
Anion gap: 13 (ref 5–15)
BUN: 33 mg/dL — ABNORMAL HIGH (ref 6–20)
CO2: 26 mmol/L (ref 22–32)
Calcium: 9.2 mg/dL (ref 8.9–10.3)
Chloride: 94 mmol/L — ABNORMAL LOW (ref 101–111)
Creatinine, Ser: 0.96 mg/dL (ref 0.44–1.00)
GFR calc Af Amer: >60 mL/min (ref >60)
GFR calc non Af Amer: >60 mL/min (ref >60)
Glucose, Bld: 139 mg/dL — ABNORMAL HIGH (ref 65–99)
Potassium: 2.6 mmol/L — CL (ref 3.5–5.1)
Sodium: 133 mmol/L — ABNORMAL LOW (ref 135–145)

## 2017-05-24 LAB — I-STAT TROPONIN, ED
Troponin i, poc: 0 ng/mL (ref 0.00–0.08)
Troponin i, poc: 0 ng/mL (ref 0.00–0.08)

## 2017-05-24 LAB — CBC
HCT: 43.4 % (ref 36.0–46.0)
Hemoglobin: 14.8 g/dL (ref 12.0–15.0)
MCH: 32.1 pg (ref 26.0–34.0)
MCHC: 34.1 g/dL (ref 30.0–36.0)
MCV: 94.1 fL (ref 78.0–100.0)
Platelets: 212 10*3/uL (ref 150–400)
RBC: 4.61 MIL/uL (ref 3.87–5.11)
RDW: 12.9 % (ref 11.5–15.5)
WBC: 7.9 10*3/uL (ref 4.0–10.5)

## 2017-05-24 LAB — D-DIMER, QUANTITATIVE: D-Dimer, Quant: 0.28 ug/mL-FEU (ref 0.00–0.50)

## 2017-05-24 LAB — I-STAT BETA HCG BLOOD, ED (MC, WL, AP ONLY): I-stat hCG, quantitative: 8.5 m[IU]/mL — ABNORMAL HIGH (ref <5)

## 2017-05-24 MED ORDER — DIPHENHYDRAMINE HCL 25 MG PO CAPS
25.0000 mg | ORAL_CAPSULE | Freq: Once | ORAL | Status: AC
  Administered 2017-05-24: 25 mg via ORAL
  Filled 2017-05-24: qty 1

## 2017-05-24 MED ORDER — POTASSIUM CHLORIDE CRYS ER 20 MEQ PO TBCR
40.0000 meq | EXTENDED_RELEASE_TABLET | Freq: Once | ORAL | Status: AC
  Administered 2017-05-24: 40 meq via ORAL
  Filled 2017-05-24: qty 2

## 2017-05-24 MED ORDER — FENTANYL CITRATE (PF) 100 MCG/2ML IJ SOLN
50.0000 ug | Freq: Once | INTRAMUSCULAR | Status: AC
  Administered 2017-05-24: 50 ug via INTRAVENOUS
  Filled 2017-05-24: qty 2

## 2017-05-24 MED ORDER — TRAMADOL HCL 50 MG PO TABS: 50.0000 mg | ORAL_TABLET | Freq: Four times a day (QID) | ORAL | 0 refills | Status: DC | PRN

## 2017-05-24 MED ORDER — POTASSIUM CHLORIDE CRYS ER 20 MEQ PO TBCR: 20.0000 meq | EXTENDED_RELEASE_TABLET | Freq: Two times a day (BID) | ORAL | 0 refills | Status: DC

## 2017-05-24 NOTE — ED Triage Notes (Signed)
Pt arrived via [facility] c/o chest pain with mov't x6 days with n/v beginning this morning. Pt states she had 2 episodes of emesis this morning. Pt has hx of CVA. Left arm remains contracted. EMS gave 4mg  zofran PTA.

## 2017-05-24 NOTE — Discharge Instructions (Signed)

## 2017-05-24 NOTE — ED Provider Notes (Addendum)
Emergency Department Provider Note   I have reviewed the triage vital signs and the nursing notes.   HISTORY  Chief Complaint Chest Pain   HPI Carla Bowen is a 62 y.o. female with PMH of Fibromyalgia, HLD, HTN, and prior CVA with left side deficits to the emergency department for evaluation of left sided chest and arm pain for the last 5 days.  Symptoms began on Saturday with pain in the left arm.  With the patient's baseline weakness she sometimes has muscle cramping in the left arm.  She states that this progressed to be painful in her entire left arm and then left chest.  Symptoms have been constant for the past 4-5 days with gradual worsening of symptoms.  Worse with movement or speaking.  Worse with touching the area.  She describes some mild dyspnea.  No fevers, chills, productive cough, hemoptysis.  She has been taking her home medications including muscle relaxers with no significant relief in symptoms. No other radiation of symptoms or modifying factors.    Past Medical History:  Diagnosis Date  . Anxiety   . Depression   . Fibromyalgia   . High cholesterol   . Hypertension   . Stroke [facility](HCC)     Patient Active Problem List   Diagnosis Date Noted  . Alcohol use disorder, severe, dependence (HCC) 06/19/2016  . 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/13/2016  . Chest tightness   . History of stroke   . Left hemiparesis (HCC)   . Stroke (HCC)   . Chest pain 11/03/2014  . UTI (lower urinary tract infection) 11/03/2014  . HTN (hypertension) 11/03/2014  . ICH (intracerebral hemorrhage) (HCC) 05/30/2013  . Spastic hemiplegia affecting nondominant side (HCC) 05/30/2013  . Abnormality of gait 05/30/2013  . 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 Disturbance of skin sensation 05/30/2013    Past Surgical History:  Procedure Laterality Date  . CESAREAN SECTION      Current Outpatient Rx  . Order #: [id number] Class: Normal  . Order #: [id number] Class:  Historical Med  . Order #: [id number] Class: Historical Med  . Order #: [id number] Class: Historical Med  . Order #: [id number] Class: Historical Med  . Order #: [id number] Class: Print  . Order #: [id number] Class: Normal  . Order #: [id number] Class: Print  . Order #: [id number] Class: Historical Med    Allergies Ciprofloxacin; Codeine; and Sulfa antibiotics  Family History  Problem Relation Age of Onset  . Lung cancer Father   . Diabetes Brother   . Heart attack Brother   . Hypertension Brother   . Hypertension Sister     Social History Social History   Tobacco Use  . Smoking status: Current Every Day Smoker    Packs/day: 0.50    Years: 20.00    Pack years: 10.00  . Smokeless tobacco: Never Used  Substance Use Topics  . Alcohol use: Yes    Alcohol/week: 6.0 oz    Types: 5 Cans of beer, 5 Shots of liquor per week  . Drug use: No    Review of Systems  Constitutional: No fever/chills Eyes: No visual changes. ENT: No sore throat. Cardiovascular: Positive chest pain. Respiratory: Denies shortness of breath. Gastrointestinal: No abdominal pain.  No nausea, no vomiting.  No diarrhea.  No constipation. Genitourinary: Negative for dysuria. Musculoskeletal: Negative for back pain. Positive left arm pain.  Skin: Negative for rash. Neurological: Negative for headaches, focal weakness or numbness.  10-point ROS otherwise negative.  ____________________________________________   PHYSICAL EXAM:  VITAL  SIGNS: ED Triage Vitals  Enc Vitals Group     BP 05/24/17 1214 126/75     Pulse Rate 05/24/17 1214 72     Resp 05/24/17 1214 16     Temp 05/24/17 1214 98.2 F (36.8 C)     Temp Source 05/24/17 1214 Oral     SpO2 05/24/17 1214 94 %     Weight 05/24/17 1214 113 lb (51.3 kg)     Height 05/24/17 1214 5\' 1"  (1.549 m)     Pain Score 05/24/17 1240 10   Constitutional: Alert and oriented. Well appearing and in no acute distress. Eyes: Conjunctivae are normal. Head:  Atraumatic. Nose: No congestion/rhinnorhea. Mouth/Throat: Mucous membranes are moist.  Oropharynx non-erythematous. Neck: No stridor. Cardiovascular: Normal rate, regular rhythm. Good peripheral circulation. Grossly normal heart sounds.   Respiratory: Normal respiratory effort.  No retractions. Lungs CTAB. Gastrointestinal: Soft and nontender. No distention.  Musculoskeletal: No lower extremity tenderness nor edema. No gross deformities of extremities. Patient shouts and withdrawals with palpation of the left anterior chest wall. No deformity or paradoxical movement.  Neurologic:  Normal language. Speech is slightly slurred. Baseline LUE weakness with mild contraction. No other neuri deficits appreciated.  Skin:  Skin is warm, dry and intact. No rash noted.  ____________________________________________   LABS (all labs ordered are listed, but only abnormal results are displayed)  Labs Reviewed  BASIC METABOLIC PANEL - Abnormal; Notable for the following components:      Result Value   Sodium 133 (*)    Potassium 2.6 (*)    Chloride 94 (*)    Glucose, Bld 139 (*)    BUN 33 (*)    All other components within normal limits  I-STAT BETA HCG BLOOD, ED ([facility], [facility], [facility] ONLY) - Abnormal; Notable for the following components:   I-stat hCG, quantitative 8.5 (*)    All other components within normal limits  CBC  D-DIMER, QUANTITATIVE (NOT AT [facility])  I-STAT TROPONIN, ED  I-STAT TROPONIN, ED   ____________________________________________  EKG   EKG Interpretation  Date/Time:  Wednesday May 24 2017 12:15:17 EDT Ventricular Rate:  80 PR Interval:    QRS Duration: 91 QT Interval:  403 QTC Calculation: 465 R Axis:   68 Text Interpretation:  Sinus rhythm Nonspecific T abnormalities, anterior leads No STEMI.  Confirmed by Alona Bene [phone]) on 05/24/2017 1:22:06 PM       ____________________________________________  RADIOLOGY  Dg Chest 2 View  Result Date: 05/24/2017 CLINICAL  DATA:  Chest pain for 6 days EXAM: CHEST - 2 VIEW COMPARISON:  02/15/2016 FINDINGS: The heart size and mediastinal contours are within normal limits. Both lungs are clear. The visualized skeletal structures are unremarkable. IMPRESSION: No active cardiopulmonary disease. Electronically Signed   By: Elige Ko   On: 05/24/2017 14:13    ____________________________________________   PROCEDURES  Procedure(s) performed:   Procedures  None ____________________________________________   INITIAL IMPRESSION / ASSESSMENT AND PLAN / ED COURSE  Pertinent labs & imaging results that were available during my care of the patient were reviewed by me and considered in my medical decision making (see chart for details).  Resents to the emergency department for evaluation of chest and left arm pain which is been constant over the past 4-5 days.  EKG reviewed with no acute changes.  Chest x-ray with no acute changes.  Labs are largely reassuring.  Patient has some mild dyspnea and occasional tachycardia in the exam room with me.  I am sending a  d-dimer but low suspicion for PE.  Given the significant tenderness to palpation of the chest wall suspect that this is likely musculoskeletal in origin.   Differential includes all life-threatening causes for chest pain. This includes but is not exclusive to acute coronary syndrome, aortic dissection, pulmonary embolism, cardiac tamponade, community-acquired pneumonia, pericarditis, musculoskeletal chest wall pain, etc.  02:00 PM Labs reviewed with hypokalemia noted without acute EKG changes. Pain improved with fentanyl. Hypokalemia could be causing increased muscle cramping type pain. Suspect MSK etiology. D-dimer normal. Plan for serial troponin.   05:30 PM Patient second troponin is negative. Patient received Tramadol Rx yesterday so will not provide refill here. Plan to continue Robaxin and Tramadol with PCP/Cardiology follow up. Also discussed the low  potassium. Will prescribe KDur x 5 days and advise PCP recheck in the next 3-5 days. Patient verbalizes understanding.  At this time, I do not feel there is any life-threatening condition present. I have reviewed and discussed all results (EKG, imaging, lab, urine as appropriate), exam findings with patient. I have reviewed nursing notes and appropriate previous records.  I feel the patient is safe to be discharged home without further emergent workup. Discussed usual and customary return precautions. Patient and family (if present) verbalize understanding and are comfortable with this plan.  Patient will follow-up with their primary care provider. If they do not have a primary care provider, information for follow-up has been provided to them. All questions have been answered.  ____________________________________________  FINAL CLINICAL IMPRESSION(S) / ED DIAGNOSES  Final diagnoses:  Precordial chest pain  Hypokalemia    MEDICATIONS GIVEN DURING THIS VISIT:  Medications  fentaNYL (SUBLIMAZE) injection 50 mcg (50 mcg Intravenous Given 05/24/17 1332)  diphenhydrAMINE (BENADRYL) capsule 25 mg (25 mg Oral Given 05/24/17 1330)  potassium chloride SA (K-DUR,KLOR-CON) CR tablet 40 mEq (40 mEq Oral Given 05/24/17 1435)    Note:  This document was prepared using Dragon voice recognition software and may include unintentional dictation errors.  Alona Bene, MD Emergency Medicine    Tarhonda Hollenberg, Arlyss Repress, MD 05/24/17 1740    Maia Plan, MD 05/24/17 [phone]

## 2017-06-13 ENCOUNTER — Other Ambulatory Visit: Payer: Self-pay

## 2017-06-13 ENCOUNTER — Emergency Department (HOSPITAL_COMMUNITY)
Admission: EM | Admit: 2017-06-13 | Discharge: 2017-06-14 | Disposition: A | Discharge status: Home / Self Care | Payer: Medicare Other | Attending: Emergency Medicine | Admitting: Emergency Medicine

## 2017-06-13 ENCOUNTER — Encounter (HOSPITAL_COMMUNITY): Payer: Self-pay

## 2017-06-13 DIAGNOSIS — F17200 Nicotine dependence, unspecified, uncomplicated: Secondary | ICD-10-CM | POA: Diagnosis not present

## 2017-06-13 DIAGNOSIS — Z818 Family history of other mental and behavioral disorders: Secondary | ICD-10-CM | POA: Diagnosis not present

## 2017-06-13 DIAGNOSIS — R4587 Impulsiveness: Secondary | ICD-10-CM | POA: Diagnosis not present

## 2017-06-13 DIAGNOSIS — I10 Essential (primary) hypertension: Secondary | ICD-10-CM | POA: Insufficient documentation

## 2017-06-13 DIAGNOSIS — Z7982 Long term (current) use of aspirin: Secondary | ICD-10-CM | POA: Insufficient documentation

## 2017-06-13 DIAGNOSIS — F1020 Alcohol dependence, uncomplicated: Secondary | ICD-10-CM | POA: Diagnosis present

## 2017-06-13 DIAGNOSIS — Y908 Blood alcohol level of 240 mg/100 ml or more: Secondary | ICD-10-CM | POA: Diagnosis not present

## 2017-06-13 DIAGNOSIS — F329 Major depressive disorder, single episode, unspecified: Secondary | ICD-10-CM | POA: Diagnosis present

## 2017-06-13 LAB — COMPREHENSIVE METABOLIC PANEL
ALT: 24 U/L (ref 14–54)
AST: 25 U/L (ref 15–41)
Albumin: 3.8 g/dL (ref 3.5–5.0)
Alkaline Phosphatase: 93 U/L (ref 38–126)
Anion gap: 13 (ref 5–15)
BUN: 20 mg/dL (ref 6–20)
CO2: 23 mmol/L (ref 22–32)
Calcium: 9 mg/dL (ref 8.9–10.3)
Chloride: 104 mmol/L (ref 101–111)
Creatinine, Ser: 0.86 mg/dL (ref 0.44–1.00)
GFR calc Af Amer: >60 mL/min (ref >60)
GFR calc non Af Amer: >60 mL/min (ref >60)
Glucose, Bld: 113 mg/dL — ABNORMAL HIGH (ref 65–99)
Potassium: 3.3 mmol/L — ABNORMAL LOW (ref 3.5–5.1)
Sodium: 140 mmol/L (ref 135–145)
Total Bilirubin: 0.3 mg/dL (ref 0.3–1.2)
Total Protein: 7.6 g/dL (ref 6.5–8.1)

## 2017-06-13 LAB — RAPID URINE DRUG SCREEN, HOSP PERFORMED
Amphetamines: NOT DETECTED
Barbiturates: NOT DETECTED
Benzodiazepines: NOT DETECTED
Cocaine: NOT DETECTED
Opiates: POSITIVE — AB
Tetrahydrocannabinol: NOT DETECTED

## 2017-06-13 LAB — ETHANOL: Alcohol, Ethyl (B): 247 mg/dL — ABNORMAL HIGH (ref <10)

## 2017-06-13 LAB — CBC WITH DIFFERENTIAL/PLATELET
Basophils Absolute: 0 10*3/uL (ref 0.0–0.1)
Basophils Relative: 1 %
Eosinophils Absolute: 0.1 10*3/uL (ref 0.0–0.7)
Eosinophils Relative: 1 %
HCT: 44.5 % (ref 36.0–46.0)
Hemoglobin: 15.2 g/dL — ABNORMAL HIGH (ref 12.0–15.0)
Lymphocytes Relative: 52 %
Lymphs Abs: 3.6 10*3/uL (ref 0.7–4.0)
MCH: 32.4 pg (ref 26.0–34.0)
MCHC: 34.2 g/dL (ref 30.0–36.0)
MCV: 94.9 fL (ref 78.0–100.0)
Monocytes Absolute: 0.4 10*3/uL (ref 0.1–1.0)
Monocytes Relative: 6 %
Neutro Abs: 2.7 10*3/uL (ref 1.7–7.7)
Neutrophils Relative %: 40 %
Platelets: 268 10*3/uL (ref 150–400)
RBC: 4.69 MIL/uL (ref 3.87–5.11)
RDW: 13.6 % (ref 11.5–15.5)
WBC: 6.9 10*3/uL (ref 4.0–10.5)

## 2017-06-13 LAB — SALICYLATE LEVEL: Salicylate Lvl: <7 mg/dL (ref 2.8–30.0)

## 2017-06-13 LAB — ACETAMINOPHEN LEVEL: Acetaminophen (Tylenol), Serum: <10 ug/mL — ABNORMAL LOW (ref 10–30)

## 2017-06-13 MED ORDER — ZIPRASIDONE MESYLATE 20 MG IM SOLR
10.0000 mg | Freq: Once | INTRAMUSCULAR | Status: AC
  Administered 2017-06-13: 10 mg via INTRAMUSCULAR
  Filled 2017-06-13: qty 20

## 2017-06-13 NOTE — ED Provider Notes (Addendum)
[facility]-EMERGENCY DEPT Provider Note   CSN: [id number] Arrival date & time: 06/13/17  2112     History   Chief Complaint No chief complaint on file.   HPI Carla Bowen is a 62 y.o. female.  61 year old female with past history of depression and alcohol abuse presents via law enforcement after being placed under IVC by them.  Reportedly, she had called 911 and said she needed help but hung up.  They had to trace to call and when police arrived at her trailer she endorses suicidal ideations without intent.  Patient is intoxicated at this time and the history is limited.     Past Medical History:  Diagnosis Date  . Anxiety   . Depression   . Fibromyalgia   . High cholesterol   . Hypertension   . Stroke [facility])     Patient Active Problem List   Diagnosis Date Noted  . Alcohol use disorder, severe, dependence (HCC) 06/19/2016  . 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/13/2016  . Chest tightness   . History of stroke   . Left hemiparesis (HCC)   . Stroke (HCC)   . Chest pain 11/03/2014  . UTI (lower urinary tract infection) 11/03/2014  . HTN (hypertension) 11/03/2014  . ICH (intracerebral hemorrhage) (HCC) 05/30/2013  . Spastic hemiplegia affecting nondominant side (HCC) 05/30/2013  . Abnormality of gait 05/30/2013  . 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 Disturbance of skin sensation 05/30/2013    Past Surgical History:  Procedure Laterality Date  . CESAREAN SECTION       OB History    Gravida  1   Para  1   Term  1   Preterm      AB      Living        SAB      TAB      Ectopic      Multiple      Live Births               Home Medications    Prior to Admission medications   Medication Sig Start Date End Date Taking? Authorizing Provider  amLODipine (NORVASC) 10 MG tablet Take 1 tablet (10 mg total) by mouth daily. Patient taking differently: Take 10 mg by mouth at bedtime.  06/25/13   Micki Riley, MD  aspirin EC 81 MG tablet Take 81 mg by mouth daily.    [provider]  chlorthalidone (HYGROTON) 25 MG tablet Take 25 mg by mouth at bedtime.    [provider]  cycloSPORINE (RESTASIS) 0.05 % ophthalmic emulsion Place 1 drop into both eyes 2 (two) times daily.    [provider]  fluticasone (FLONASE) 50 MCG/ACT nasal spray Place 2 sprays into both nostrils 2 (two) times daily. 02/01/17   [provider]  lansoprazole (PREVACID) 30 MG capsule Take 1 capsule (30 mg total) by mouth daily at 12 noon. Patient not taking: Reported on 02/20/2017 08/08/13   Benjiman Core, MD  lisinopril (PRINIVIL,ZESTRIL) 20 MG tablet Take 1 tablet (20 mg total) by mouth daily. Patient taking differently: Take 40 mg by mouth at bedtime.  09/23/13   Micki Riley, MD  potassium chloride SA (K-DUR,KLOR-CON) 20 MEQ tablet Take 1 tablet (20 mEq total) by mouth 2 (two) times daily for 5 days. 05/24/17 05/29/17  Long, Arlyss Repress, MD  rosuvastatin (CRESTOR) 10 MG tablet Take 10 mg by mouth at bedtime.     [provider]    Family History Family History  Problem Relation Age of Onset  . Lung cancer Father   . Diabetes Brother   . Heart attack Brother   . Hypertension Brother   . Hypertension Sister     Social History Social History   Tobacco Use  . Smoking status: Current Every Day Smoker    Packs/day: 0.50    Years: 20.00    Pack years: 10.00  . Smokeless tobacco: Never Used  Substance Use Topics  . Alcohol use: Yes    Alcohol/week: 6.0 oz    Types: 5 Cans of beer, 5 Shots of liquor per week  . Drug use: No     Allergies   Ciprofloxacin; Codeine; and Sulfa antibiotics   Review of Systems Review of Systems  Unable to perform ROS: Psychiatric disorder     Physical Exam Updated Vital Signs BP 136/88 (BP Location: Right Arm)   Pulse 93   Temp 97.6 F (36.4 C) (Oral)   Resp 18   LMP  (LMP Unknown)   SpO2 96%   Physical Exam    Constitutional: She appears well-developed and well-nourished.  Non-toxic appearance. No distress.  HENT:  Head: Normocephalic and atraumatic.  Eyes: Pupils are equal, round, and reactive to light. Conjunctivae, EOM and lids are normal.  Neck: Normal range of motion. Neck supple. No tracheal deviation present. No thyroid mass present.  Cardiovascular: Normal rate, regular rhythm and normal heart sounds. Exam reveals no gallop.  No murmur heard. Pulmonary/Chest: Effort normal and breath sounds normal. No stridor. No respiratory distress. She has no decreased breath sounds. She has no wheezes. She has no rhonchi. She has no rales.  Abdominal: Soft. Normal appearance and bowel sounds are normal. She exhibits no distension. There is no tenderness. There is no rebound and no CVA tenderness.  Musculoskeletal: Normal range of motion. She exhibits no edema or tenderness.  Neurological: She is alert. She is disoriented. She displays atrophy. GCS eye subscore is 4. GCS verbal subscore is 5. GCS motor subscore is 6.  Skin: Skin is warm and dry. No abrasion and no rash noted.  Psychiatric: Her mood appears anxious. Her affect is labile and inappropriate. Her speech is slurred. She is agitated and aggressive. She is inattentive.  Nursing note and vitals reviewed.    ED Treatments / Results  Labs (all labs ordered are listed, but only abnormal results are displayed) Labs Reviewed  ETHANOL  RAPID URINE DRUG SCREEN, HOSP PERFORMED  SALICYLATE LEVEL  ACETAMINOPHEN LEVEL  CBC WITH DIFFERENTIAL/PLATELET  COMPREHENSIVE METABOLIC PANEL    EKG None  Radiology No results found.  Procedures Procedures (including critical care time)  Medications Ordered in ED Medications  ziprasidone (GEODON) injection 10 mg (has no administration in time range)     Initial Impression / Assessment and Plan / ED Course  I have reviewed the triage vital signs and the nursing notes.  Pertinent labs & imaging  results that were available during my care of the patient were reviewed by me and considered in my medical decision making (see chart for details).     Patient placed in four-point restraints for her safety.  Will be given Geodon here because of agitation.  Patient's last ECG showed no signs of QT prolongation.  She is under IVC by Patent examiner.  Medical clearance labs ordered and are pending at this time.  Patient will be seen by behavioral health when she is medically cleared  11:04 PM Patient  potassium 3.3 and she will be given oral potassium.  Alcohol level is 247.  She is resting comfortably after the Geodon.  Will be assessed when she is sober and will sign out to Dr. Judd Lienelo  CRITICAL CARE Performed by: Toy BakerAnthony T Kert Shackett Total critical care time: 60 minutes Critical care time was exclusive of separately billable procedures and treating other patients. Critical care was necessary to treat or prevent imminent or life-threatening deterioration. Critical care was time spent personally by me on the following activities: development of treatment plan with patient and/or surrogate as well as nursing, discussions with consultants, evaluation of patient's response to treatment, examination of patient, obtaining history from patient or surrogate, ordering and performing treatments and interventions, ordering and review of laboratory studies, ordering and review of radiographic studies, pulse oximetry and re-evaluation of patient's condition.   Final Clinical Impressions(s) / ED Diagnoses   Final diagnoses:  None    ED Discharge Orders    None       Lorre NickAllen, Promyse Ardito, MD 06/13/17 2147    Lorre NickAllen, Numa Schroeter, MD 06/13/17 2224    Lorre NickAllen, Terryn Redner, MD 06/13/17 [phone]

## 2017-06-13 NOTE — ED Notes (Signed)
Pt alert x4 resting, no apparent distress noted. I will continue to monitor.

## 2017-06-13 NOTE — ED Notes (Signed)
Bed: WA30 Expected date:  Expected time:  Means of arrival:  Comments: 

## 2017-06-13 NOTE — ED Notes (Signed)
Charting done by Juliette Mangleerri Samanthia Howland RN-not Deirdre PippinsWendy Munro

## 2017-06-13 NOTE — ED Notes (Signed)
Bed: [id number] Expected date:  Expected time:  Means of arrival:  Comments: Combative female

## 2017-06-13 NOTE — ED Triage Notes (Signed)
[facility]-deputy states that patient called 911 and stated she needed help-patient hung up-deputy responded and son answered the door-states patient in her bedroom-patient has been drinking heavily-patient stated to deputy she did not want to live-deputy states she is well known to them. Patient combative/agitated-kicking at staff-attempted to help patient with bathroom to void and given water to drink-patient aggressive with staff-kicking and trying to bite staff-restraints applied. Evaluated by Dr. Allen-security and off duty with patient.

## 2017-06-14 ENCOUNTER — Encounter (HOSPITAL_COMMUNITY): Payer: Self-pay | Admitting: Behavioral Health

## 2017-06-14 DIAGNOSIS — F1020 Alcohol dependence, uncomplicated: Secondary | ICD-10-CM

## 2017-06-14 DIAGNOSIS — R4587 Impulsiveness: Secondary | ICD-10-CM

## 2017-06-14 DIAGNOSIS — Y908 Blood alcohol level of 240 mg/100 ml or more: Secondary | ICD-10-CM

## 2017-06-14 DIAGNOSIS — Z818 Family history of other mental and behavioral disorders: Secondary | ICD-10-CM

## 2017-06-14 MED ORDER — ASPIRIN EC 81 MG PO TBEC
81.0000 mg | DELAYED_RELEASE_TABLET | Freq: Every day | ORAL | Status: DC
  Administered 2017-06-14: 81 mg via ORAL
  Filled 2017-06-14: qty 1

## 2017-06-14 MED ORDER — ROSUVASTATIN CALCIUM 10 MG PO TABS
10.0000 mg | ORAL_TABLET | Freq: Every day | ORAL | Status: DC
  Administered 2017-06-14: 10 mg via ORAL
  Filled 2017-06-14: qty 1

## 2017-06-14 MED ORDER — CYCLOSPORINE 0.05 % OP EMUL
1.0000 [drp] | Freq: Two times a day (BID) | OPHTHALMIC | Status: DC
  Administered 2017-06-14 (×2): 1 [drp] via OPHTHALMIC
  Filled 2017-06-14 (×2): qty 1

## 2017-06-14 MED ORDER — POTASSIUM CHLORIDE CRYS ER 20 MEQ PO TBCR
20.0000 meq | EXTENDED_RELEASE_TABLET | Freq: Two times a day (BID) | ORAL | Status: DC
  Administered 2017-06-14 (×2): 20 meq via ORAL
  Filled 2017-06-14 (×2): qty 1

## 2017-06-14 MED ORDER — CHLORTHALIDONE 25 MG PO TABS
25.0000 mg | ORAL_TABLET | Freq: Every day | ORAL | Status: DC
  Administered 2017-06-14: 25 mg via ORAL
  Filled 2017-06-14: qty 1

## 2017-06-14 MED ORDER — FLUTICASONE PROPIONATE 50 MCG/ACT NA SUSP
2.0000 | Freq: Two times a day (BID) | NASAL | Status: DC
  Administered 2017-06-14: 2 via NASAL
  Filled 2017-06-14: qty 16

## 2017-06-14 MED ORDER — AMLODIPINE BESYLATE 5 MG PO TABS
10.0000 mg | ORAL_TABLET | Freq: Every day | ORAL | Status: DC
  Administered 2017-06-14: 10 mg via ORAL
  Filled 2017-06-14 (×2): qty 2
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
## 2017-06-14 NOTE — [facility] Suicide Risk Assessment (Signed)
Suicide Risk Assessment  Discharge Assessment   [facility] Discharge Suicide Risk Assessment   Principal Problem: Alcohol use disorder, severe, dependence [facility]) Discharge Diagnoses:  Patient Active Problem List   Diagnosis Date Noted  . Alcohol use disorder, severe, dependence ([place]) [F10.20] 06/19/2016  . 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 Chest tightness [R07.89]   . History of stroke [Z86.73]   . Left hemiparesis ([place]) [G81.94]   . Stroke [facility]) [I63.9]   . Chest pain [R07.9] 11/03/2014  . UTI (lower urinary tract infection) [N39.0] 11/03/2014  . HTN (hypertension) [I10] 11/03/2014  . ICH (intracerebral hemorrhage) ([place]) [I61.9] 05/30/2013  . 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 Abnormality of gait [R26.9] 05/30/2013  . 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 Disturbance of skin sensation [R20.9] 05/30/2013   Pt was seen and chart reviewed with treatment team and Dr Mariea Clonts. Pt denies suicidal/homicidal ideation, denies auditory/visual hallucinations and does not appear to be responding to internal stimuli. Pt's BAL 247, UDS positive for opiates which are prescribed to her. Pt stated she does not have a problem with alcohol, she just drank too much yesterday. Pt is unaware of the events that took place yesterday and stated she is embarrassed that she is here. Pt does have a prior history of alcohol use disorder, severe but does not wish to receive any resources or speak to Peer Support. Pt is psychiatrically clear for discharge.   Total Time spent with patient: 30 minutes  Musculoskeletal: Strength & Muscle Tone: within normal limits Gait & Station: normal Patient leans: N/A  Psychiatric Specialty Exam:   Blood pressure (!) 106/58, pulse 80, temperature 98.9 F (37.2 C), temperature source Oral, resp. rate 16, SpO2 94 %.There is no height or weight on file to calculate BMI.  General Appearance: Casual  Eye  Contact::  Good  Speech:  Clear and Coherent and Normal Rate409  Volume:  Normal  Mood:  Euthymic  Affect:  Congruent  Thought Process:  Coherent, Goal Directed and Linear  Orientation:  Full (Time, Place, and Person)  Thought Content:  Logical  Suicidal Thoughts:  No  Homicidal Thoughts:  No  Memory:  Immediate;   Good Recent;   Good Remote;   Fair  Judgement:  Fair  Insight:  Fair  Psychomotor Activity:  EPS  Concentration:  Good  Recall:  Good  Fund of Knowledge:Good  Language: Good  Akathisia:  No  Handed:  Right  AIMS (if indicated):     Assets:  Agricultural consultant Housing  Sleep:     Cognition: WNL  ADL's:  Intact   Mental Status Per Nursing Assessment::   On Admission:   Intoxicated  Demographic Factors:  Divorced or widowed and Caucasian  Loss Factors: Financial problems/change in socioeconomic status  Historical Factors: Impulsivity  Risk Reduction Factors:   Sense of responsibility to family and Living with another person, especially a relative  Continued Clinical Symptoms:  Depression:   Comorbid alcohol abuse/dependence Impulsivity Alcohol/Substance Abuse/Dependencies  Cognitive Features That Contribute To Risk:  Closed-mindedness    Suicide Risk:  Minimal: No identifiable suicidal ideation.  Patients presenting with no risk factors but with morbid ruminations; may be classified as minimal risk based on the severity of the depressive symptoms    Plan Of Care/Follow-up recommendations:  Activity:  as tolerated Diet:  Heart Healthy  Ethelene Hal, NP 06/14/2017, 11:41 AM

## 2017-06-14 NOTE — ED Notes (Signed)
Pt d/c home per MD order. Discharge summary reviewed with pt, pt verbalizes understanding. Pt denies SI/HI/AVH. Personal property returned to pt. Pt signed e-signature. Ambulatory off unit. Pt son in lobby for discharge,

## 2017-06-14 NOTE — ED Notes (Signed)
Pt admitted to room #39. Pt denies SI/HI/AVH. "I got out of control with the alcohol last night, I'm not a drinker." Pt reports she is ready to go home. Encouragement and support provided. Special checks q 15 mins in place for safety, Video monitoring in place. Will continue to monitor.

## 2017-06-14 NOTE — BH Assessment (Signed)
Attempted to complete assessment, but the pt was sedated and is able to be awaken.

## 2017-06-14 NOTE — ED Notes (Signed)
Pt up in room walking without difficulty, balance good at present time. Pt alert and oriented.

## 2017-06-14 NOTE — BH Assessment (Signed)
[facility]HH Assessment Progress Note  Per Jacqueline Norman, DO, this pt does not require psychiatric hospitalization at this time.  Pt presents under IVC initiated by law enforcement, which Dr Norman has rescinded.  [facility] from [facility].  No outpatient referrals are necessary.  Pt's nurse, Ashley, has been notified.  Lyman Balingit, MA Triage Specialist [phone]     

## 2017-07-25 ENCOUNTER — Ambulatory Visit
Admission: RE | Admit: 2017-07-25 | Discharge: 2017-07-25 | Disposition: A | Discharge status: Home / Self Care | Payer: Medicare Other | Source: Ambulatory Visit | Attending: Family Medicine | Admitting: Family Medicine

## 2017-07-25 ENCOUNTER — Other Ambulatory Visit: Payer: Self-pay | Admitting: Family Medicine

## 2017-07-25 DIAGNOSIS — R109 Unspecified abdominal pain: Secondary | ICD-10-CM

## 2017-09-03 ENCOUNTER — Emergency Department (HOSPITAL_COMMUNITY): Payer: Medicare Other

## 2017-09-03 ENCOUNTER — Encounter (HOSPITAL_COMMUNITY): Payer: Self-pay | Admitting: Emergency Medicine

## 2017-09-03 ENCOUNTER — Inpatient Hospital Stay (HOSPITAL_COMMUNITY)
Admission: EM | Admit: 2017-09-03 | Discharge: 2017-09-25 | DRG: 004 | Disposition: A | Discharge status: Other Acute Inpatient Hospital | Payer: Medicare Other | Attending: Family Medicine | Admitting: Family Medicine

## 2017-09-03 DIAGNOSIS — R109 Unspecified abdominal pain: Secondary | ICD-10-CM

## 2017-09-03 DIAGNOSIS — I10 Essential (primary) hypertension: Secondary | ICD-10-CM | POA: Diagnosis present

## 2017-09-03 DIAGNOSIS — Z7982 Long term (current) use of aspirin: Secondary | ICD-10-CM

## 2017-09-03 DIAGNOSIS — J690 Pneumonitis due to inhalation of food and vomit: Secondary | ICD-10-CM | POA: Diagnosis present

## 2017-09-03 DIAGNOSIS — E781 Pure hyperglyceridemia: Secondary | ICD-10-CM | POA: Diagnosis present

## 2017-09-03 DIAGNOSIS — R6521 Severe sepsis with septic shock: Secondary | ICD-10-CM | POA: Diagnosis present

## 2017-09-03 DIAGNOSIS — J9600 Acute respiratory failure, unspecified whether with hypoxia or hypercapnia: Secondary | ICD-10-CM | POA: Diagnosis not present

## 2017-09-03 DIAGNOSIS — Z882 Allergy status to sulfonamides status: Secondary | ICD-10-CM

## 2017-09-03 DIAGNOSIS — J189 Pneumonia, unspecified organism: Secondary | ICD-10-CM | POA: Diagnosis not present

## 2017-09-03 DIAGNOSIS — G9340 Encephalopathy, unspecified: Secondary | ICD-10-CM | POA: Diagnosis not present

## 2017-09-03 DIAGNOSIS — R079 Chest pain, unspecified: Secondary | ICD-10-CM | POA: Diagnosis not present

## 2017-09-03 DIAGNOSIS — F329 Major depressive disorder, single episode, unspecified: Secondary | ICD-10-CM | POA: Diagnosis present

## 2017-09-03 DIAGNOSIS — R680 Hypothermia, not associated with low environmental temperature: Secondary | ICD-10-CM | POA: Diagnosis present

## 2017-09-03 DIAGNOSIS — Z01818 Encounter for other preprocedural examination: Secondary | ICD-10-CM

## 2017-09-03 DIAGNOSIS — E872 Acidosis: Secondary | ICD-10-CM | POA: Diagnosis present

## 2017-09-03 DIAGNOSIS — J15211 Pneumonia due to Methicillin susceptible Staphylococcus aureus: Secondary | ICD-10-CM | POA: Diagnosis present

## 2017-09-03 DIAGNOSIS — F419 Anxiety disorder, unspecified: Secondary | ICD-10-CM | POA: Diagnosis present

## 2017-09-03 DIAGNOSIS — F10229 Alcohol dependence with intoxication, unspecified: Secondary | ICD-10-CM | POA: Diagnosis present

## 2017-09-03 DIAGNOSIS — F13239 Sedative, hypnotic or anxiolytic dependence with withdrawal, unspecified: Secondary | ICD-10-CM | POA: Diagnosis present

## 2017-09-03 DIAGNOSIS — I69359 Hemiplegia and hemiparesis following cerebral infarction affecting unspecified side: Secondary | ICD-10-CM | POA: Diagnosis not present

## 2017-09-03 DIAGNOSIS — Z9911 Dependence on respirator [ventilator] status: Secondary | ICD-10-CM

## 2017-09-03 DIAGNOSIS — Z781 Physical restraint status: Secondary | ICD-10-CM

## 2017-09-03 DIAGNOSIS — Z79899 Other long term (current) drug therapy: Secondary | ICD-10-CM

## 2017-09-03 DIAGNOSIS — D72819 Decreased white blood cell count, unspecified: Secondary | ICD-10-CM | POA: Diagnosis not present

## 2017-09-03 DIAGNOSIS — E875 Hyperkalemia: Secondary | ICD-10-CM | POA: Diagnosis not present

## 2017-09-03 DIAGNOSIS — R739 Hyperglycemia, unspecified: Secondary | ICD-10-CM | POA: Diagnosis not present

## 2017-09-03 DIAGNOSIS — M797 Fibromyalgia: Secondary | ICD-10-CM | POA: Diagnosis present

## 2017-09-03 DIAGNOSIS — F10939 Alcohol use, unspecified with withdrawal, unspecified: Secondary | ICD-10-CM

## 2017-09-03 DIAGNOSIS — E876 Hypokalemia: Secondary | ICD-10-CM | POA: Diagnosis present

## 2017-09-03 DIAGNOSIS — I4891 Unspecified atrial fibrillation: Secondary | ICD-10-CM | POA: Diagnosis not present

## 2017-09-03 DIAGNOSIS — Z885 Allergy status to narcotic agent status: Secondary | ICD-10-CM

## 2017-09-03 DIAGNOSIS — D7589 Other specified diseases of blood and blood-forming organs: Secondary | ICD-10-CM | POA: Diagnosis present

## 2017-09-03 DIAGNOSIS — K560 Paralytic ileus: Secondary | ICD-10-CM | POA: Diagnosis present

## 2017-09-03 DIAGNOSIS — E162 Hypoglycemia, unspecified: Secondary | ICD-10-CM | POA: Diagnosis not present

## 2017-09-03 DIAGNOSIS — J9811 Atelectasis: Secondary | ICD-10-CM | POA: Diagnosis present

## 2017-09-03 DIAGNOSIS — J9620 Acute and chronic respiratory failure, unspecified whether with hypoxia or hypercapnia: Secondary | ICD-10-CM | POA: Diagnosis not present

## 2017-09-03 DIAGNOSIS — K55039 Acute (reversible) ischemia of large intestine, extent unspecified: Secondary | ICD-10-CM | POA: Diagnosis present

## 2017-09-03 DIAGNOSIS — I700 Atherosclerosis of aorta: Secondary | ICD-10-CM | POA: Diagnosis present

## 2017-09-03 DIAGNOSIS — J9690 Respiratory failure, unspecified, unspecified whether with hypoxia or hypercapnia: Secondary | ICD-10-CM

## 2017-09-03 DIAGNOSIS — D696 Thrombocytopenia, unspecified: Secondary | ICD-10-CM | POA: Diagnosis not present

## 2017-09-03 DIAGNOSIS — N200 Calculus of kidney: Secondary | ICD-10-CM | POA: Diagnosis present

## 2017-09-03 DIAGNOSIS — E43 Unspecified severe protein-calorie malnutrition: Secondary | ICD-10-CM | POA: Diagnosis present

## 2017-09-03 DIAGNOSIS — F10239 Alcohol dependence with withdrawal, unspecified: Secondary | ICD-10-CM | POA: Diagnosis present

## 2017-09-03 DIAGNOSIS — N179 Acute kidney failure, unspecified: Secondary | ICD-10-CM

## 2017-09-03 DIAGNOSIS — Z430 Encounter for attention to tracheostomy: Secondary | ICD-10-CM

## 2017-09-03 DIAGNOSIS — Z930 Tracheostomy status: Secondary | ICD-10-CM

## 2017-09-03 DIAGNOSIS — J9601 Acute respiratory failure with hypoxia: Secondary | ICD-10-CM

## 2017-09-03 DIAGNOSIS — Z888 Allergy status to other drugs, medicaments and biological substances status: Secondary | ICD-10-CM

## 2017-09-03 DIAGNOSIS — R000 Tachycardia, unspecified: Secondary | ICD-10-CM

## 2017-09-03 DIAGNOSIS — Z4659 Encounter for fitting and adjustment of other gastrointestinal appliance and device: Secondary | ICD-10-CM

## 2017-09-03 DIAGNOSIS — F17210 Nicotine dependence, cigarettes, uncomplicated: Secondary | ICD-10-CM | POA: Diagnosis present

## 2017-09-03 DIAGNOSIS — F10129 Alcohol abuse with intoxication, unspecified: Secondary | ICD-10-CM

## 2017-09-03 DIAGNOSIS — Z6821 Body mass index (BMI) 21.0-21.9, adult: Secondary | ICD-10-CM

## 2017-09-03 DIAGNOSIS — Z8249 Family history of ischemic heart disease and other diseases of the circulatory system: Secondary | ICD-10-CM

## 2017-09-03 DIAGNOSIS — G9341 Metabolic encephalopathy: Secondary | ICD-10-CM | POA: Diagnosis present

## 2017-09-03 DIAGNOSIS — D539 Nutritional anemia, unspecified: Secondary | ICD-10-CM | POA: Diagnosis not present

## 2017-09-03 DIAGNOSIS — R0902 Hypoxemia: Secondary | ICD-10-CM

## 2017-09-03 DIAGNOSIS — F418 Other specified anxiety disorders: Secondary | ICD-10-CM | POA: Diagnosis present

## 2017-09-03 DIAGNOSIS — K529 Noninfective gastroenteritis and colitis, unspecified: Secondary | ICD-10-CM

## 2017-09-03 DIAGNOSIS — Z23 Encounter for immunization: Secondary | ICD-10-CM | POA: Diagnosis present

## 2017-09-03 DIAGNOSIS — R4182 Altered mental status, unspecified: Secondary | ICD-10-CM | POA: Diagnosis not present

## 2017-09-03 DIAGNOSIS — Z452 Encounter for adjustment and management of vascular access device: Secondary | ICD-10-CM

## 2017-09-03 DIAGNOSIS — J90 Pleural effusion, not elsewhere classified: Secondary | ICD-10-CM | POA: Diagnosis present

## 2017-09-03 DIAGNOSIS — J80 Acute respiratory distress syndrome: Secondary | ICD-10-CM

## 2017-09-03 DIAGNOSIS — E7800 Pure hypercholesterolemia, unspecified: Secondary | ICD-10-CM | POA: Diagnosis present

## 2017-09-03 DIAGNOSIS — A419 Sepsis, unspecified organism: Principal | ICD-10-CM

## 2017-09-03 DIAGNOSIS — J698 Pneumonitis due to inhalation of other solids and liquids: Secondary | ICD-10-CM | POA: Diagnosis not present

## 2017-09-03 DIAGNOSIS — J449 Chronic obstructive pulmonary disease, unspecified: Secondary | ICD-10-CM | POA: Diagnosis not present

## 2017-09-03 DIAGNOSIS — Z9289 Personal history of other medical treatment: Secondary | ICD-10-CM

## 2017-09-03 DIAGNOSIS — F1010 Alcohol abuse, uncomplicated: Secondary | ICD-10-CM | POA: Diagnosis not present

## 2017-09-03 DIAGNOSIS — E8770 Fluid overload, unspecified: Secondary | ICD-10-CM | POA: Diagnosis not present

## 2017-09-03 DIAGNOSIS — E785 Hyperlipidemia, unspecified: Secondary | ICD-10-CM | POA: Diagnosis present

## 2017-09-03 DIAGNOSIS — R197 Diarrhea, unspecified: Secondary | ICD-10-CM

## 2017-09-03 DIAGNOSIS — E870 Hyperosmolality and hypernatremia: Secondary | ICD-10-CM | POA: Diagnosis present

## 2017-09-03 DIAGNOSIS — R509 Fever, unspecified: Secondary | ICD-10-CM

## 2017-09-03 DIAGNOSIS — E46 Unspecified protein-calorie malnutrition: Secondary | ICD-10-CM | POA: Diagnosis not present

## 2017-09-03 HISTORY — DX: Atherosclerosis of aorta: I70.0

## 2017-09-03 LAB — CBC WITH DIFFERENTIAL/PLATELET
Abs Immature Granulocytes: 0 10*3/uL (ref 0.0–0.1)
Basophils Absolute: 0 10*3/uL (ref 0.0–0.1)
Basophils Relative: 1 %
Eosinophils Absolute: 0 10*3/uL (ref 0.0–0.7)
Eosinophils Relative: 0 %
HCT: 47.5 % — ABNORMAL HIGH (ref 36.0–46.0)
Hemoglobin: 15.4 g/dL — ABNORMAL HIGH (ref 12.0–15.0)
Immature Granulocytes: 1 %
Lymphocytes Relative: 13 %
Lymphs Abs: 0.5 10*3/uL — ABNORMAL LOW (ref 0.7–4.0)
MCH: 32.4 pg (ref 26.0–34.0)
MCHC: 32.4 g/dL (ref 30.0–36.0)
MCV: 99.8 fL (ref 78.0–100.0)
Monocytes Absolute: 0.5 10*3/uL (ref 0.1–1.0)
Monocytes Relative: 12 %
Neutro Abs: 2.9 10*3/uL (ref 1.7–7.7)
Neutrophils Relative %: 73 %
Platelets: 222 10*3/uL (ref 150–400)
RBC: 4.76 MIL/uL (ref 3.87–5.11)
RDW: 14.4 % (ref 11.5–15.5)
WBC: 3.9 10*3/uL — ABNORMAL LOW (ref 4.0–10.5)

## 2017-09-03 LAB — COMPREHENSIVE METABOLIC PANEL
ALT: 40 U/L (ref 14–54)
AST: 93 U/L — ABNORMAL HIGH (ref 15–41)
Albumin: 3.6 g/dL (ref 3.5–5.0)
Alkaline Phosphatase: 446 U/L — ABNORMAL HIGH (ref 38–126)
Anion gap: 14 (ref 5–15)
BUN: 35 mg/dL — ABNORMAL HIGH (ref 6–20)
CO2: 22 mmol/L (ref 22–32)
Calcium: 9.4 mg/dL (ref 8.9–10.3)
Chloride: 103 mmol/L (ref 101–111)
Creatinine, Ser: 2.3 mg/dL — ABNORMAL HIGH (ref 0.44–1.00)
GFR calc Af Amer: 26 mL/min — ABNORMAL LOW (ref >60)
GFR calc non Af Amer: 22 mL/min — ABNORMAL LOW (ref >60)
Glucose, Bld: 133 mg/dL — ABNORMAL HIGH (ref 65–99)
Potassium: 2.8 mmol/L — ABNORMAL LOW (ref 3.5–5.1)
Sodium: 139 mmol/L (ref 135–145)
Total Bilirubin: 0.9 mg/dL (ref 0.3–1.2)
Total Protein: 6.7 g/dL (ref 6.5–8.1)

## 2017-09-03 LAB — I-STAT VENOUS BLOOD GAS, ED
Acid-base deficit: 10 mmol/L — ABNORMAL HIGH (ref 0.0–2.0)
Bicarbonate: 15.3 mmol/L — ABNORMAL LOW (ref 20.0–28.0)
O2 Saturation: 98 %
TCO2: 16 mmol/L — ABNORMAL LOW (ref 22–32)
pCO2, Ven: 30.9 mmHg — ABNORMAL LOW (ref 44.0–60.0)
pH, Ven: 7.303 (ref 7.250–7.430)
pO2, Ven: 124 mmHg — ABNORMAL HIGH (ref 32.0–45.0)

## 2017-09-03 LAB — URINALYSIS, ROUTINE W REFLEX MICROSCOPIC
Bacteria, UA: NONE SEEN
Bilirubin Urine: NEGATIVE
Glucose, UA: NEGATIVE mg/dL
Hgb urine dipstick: NEGATIVE
Ketones, ur: NEGATIVE mg/dL
Leukocytes, UA: NEGATIVE
Nitrite: NEGATIVE
Protein, ur: 30 mg/dL — AB
Specific Gravity, Urine: 1.017 (ref 1.005–1.030)
pH: 5 (ref 5.0–8.0)

## 2017-09-03 LAB — CREATININE, SERUM
Creatinine, Ser: 2.03 mg/dL — ABNORMAL HIGH (ref 0.44–1.00)
GFR calc Af Amer: 30 mL/min — ABNORMAL LOW (ref >60)
GFR calc non Af Amer: 26 mL/min — ABNORMAL LOW (ref >60)

## 2017-09-03 LAB — I-STAT CG4 LACTIC ACID, ED
Lactic Acid, Venous: 3.23 mmol/L (ref 0.5–1.9)
Lactic Acid, Venous: 2.04 mmol/L (ref 0.5–1.9)
Lactic Acid, Venous: 2.06 mmol/L (ref 0.5–1.9)

## 2017-09-03 LAB — AMMONIA
Ammonia: 23 umol/L (ref 9–35)
Ammonia: 13 umol/L (ref 9–35)

## 2017-09-03 LAB — ETHANOL: Alcohol, Ethyl (B): <10 mg/dL (ref <10)

## 2017-09-03 LAB — TROPONIN I: Troponin I: <0.03 ng/mL (ref <0.03)

## 2017-09-03 LAB — TSH: TSH: 0.082 u[IU]/mL — ABNORMAL LOW (ref 0.350–4.500)

## 2017-09-03 LAB — GAMMA GT: GGT: 45 U/L (ref 7–50)

## 2017-09-03 LAB — MAGNESIUM: Magnesium: 2.6 mg/dL — ABNORMAL HIGH (ref 1.7–2.4)

## 2017-09-03 LAB — CK: Total CK: 63 U/L (ref 38–234)

## 2017-09-03 LAB — LIPASE, BLOOD: Lipase: 26 U/L (ref 11–51)

## 2017-09-03 MED ORDER — GABAPENTIN 100 MG PO CAPS: 100.0000 mg | ORAL_CAPSULE | Freq: Every day | ORAL | Status: DC

## 2017-09-03 MED ORDER — HEPARIN SODIUM (PORCINE) 5000 UNIT/ML IJ SOLN
5000.0000 [IU] | Freq: Three times a day (TID) | INTRAMUSCULAR | Status: DC
  Administered 2017-09-03: 5000 [IU] via SUBCUTANEOUS
  Filled 2017-09-03: qty 1

## 2017-09-03 MED ORDER — SODIUM CHLORIDE 0.9 % IV SOLN
1.0000 g | Freq: Once | INTRAVENOUS | Status: AC
  Administered 2017-09-03: 1 g via INTRAVENOUS
  Filled 2017-09-03: qty 10

## 2017-09-03 MED ORDER — THIAMINE HCL 100 MG/ML IJ SOLN
100.0000 mg | Freq: Every day | INTRAMUSCULAR | Status: DC
  Administered 2017-09-04 – 2017-09-05 (×2): 100 mg via INTRAVENOUS
  Filled 2017-09-03 (×2): qty 2

## 2017-09-03 MED ORDER — VALACYCLOVIR HCL 500 MG PO TABS
500.0000 mg | ORAL_TABLET | Freq: Two times a day (BID) | ORAL | Status: DC
  Filled 2017-09-03: qty 1

## 2017-09-03 MED ORDER — LAMOTRIGINE 25 MG PO TABS: 25.0000 mg | ORAL_TABLET | Freq: Two times a day (BID) | ORAL | Status: DC

## 2017-09-03 MED ORDER — ARIPIPRAZOLE 10 MG PO TABS: 5.0000 mg | ORAL_TABLET | Freq: Every day | ORAL | Status: DC

## 2017-09-03 MED ORDER — METRONIDAZOLE IN NACL 5-0.79 MG/ML-% IV SOLN
500.0000 mg | Freq: Three times a day (TID) | INTRAVENOUS | Status: DC
  Administered 2017-09-03 – 2017-09-05 (×6): 500 mg via INTRAVENOUS
  Filled 2017-09-03 (×8): qty 100

## 2017-09-03 MED ORDER — SODIUM CHLORIDE 0.9 % IV BOLUS
1000.0000 mL | Freq: Once | INTRAVENOUS | Status: AC
  Administered 2017-09-03: 1000 mL via INTRAVENOUS

## 2017-09-03 MED ORDER — SODIUM CHLORIDE 0.9 % IV SOLN
500.0000 mg | Freq: Once | INTRAVENOUS | Status: AC
  Administered 2017-09-03: 500 mg via INTRAVENOUS
  Filled 2017-09-03: qty 500

## 2017-09-03 MED ORDER — PANTOPRAZOLE SODIUM 40 MG PO TBEC: 40.0000 mg | DELAYED_RELEASE_TABLET | Freq: Every day | ORAL | Status: DC

## 2017-09-03 MED ORDER — ACETAMINOPHEN 10 MG/ML IV SOLN
1000.0000 mg | Freq: Once | INTRAVENOUS | Status: AC
  Administered 2017-09-03 (×2): 1000 mg via INTRAVENOUS
  Filled 2017-09-03: qty 100

## 2017-09-03 MED ORDER — POTASSIUM CHLORIDE IN NACL 20-0.9 MEQ/L-% IV SOLN
INTRAVENOUS | Status: DC
  Administered 2017-09-03: 22:00:00 via INTRAVENOUS
  Filled 2017-09-03 (×3): qty 1000

## 2017-09-03 MED ORDER — DULOXETINE HCL 30 MG PO CPEP: 30.0000 mg | ORAL_CAPSULE | Freq: Every day | ORAL | Status: DC

## 2017-09-03 MED ORDER — ROSUVASTATIN CALCIUM 20 MG PO TABS
10.0000 mg | ORAL_TABLET | Freq: Every day | ORAL | Status: DC
  Filled 2017-09-03: qty 1

## 2017-09-03 MED ORDER — POTASSIUM CHLORIDE 10 MEQ/100ML IV SOLN
10.0000 meq | INTRAVENOUS | Status: AC
Start: 2017-09-03 — End: 2017-09-04
  Administered 2017-09-03 (×3): 10 meq via INTRAVENOUS
  Filled 2017-09-03 (×3): qty 100

## 2017-09-03 MED ORDER — NALOXONE HCL 0.4 MG/ML IJ SOLN
0.2000 mg | Freq: Once | INTRAMUSCULAR | Status: AC
  Administered 2017-09-03: 0.2 mg via INTRAVENOUS
  Filled 2017-09-03: qty 1

## 2017-09-03 MED ORDER — MONTELUKAST SODIUM 10 MG PO TABS
10.0000 mg | ORAL_TABLET | Freq: Every day | ORAL | Status: DC
  Filled 2017-09-03: qty 1

## 2017-09-03 MED ORDER — SODIUM CHLORIDE 0.9 % IV BOLUS
500.0000 mL | Freq: Once | INTRAVENOUS | Status: AC
  Administered 2017-09-03: 500 mL via INTRAVENOUS

## 2017-09-03 MED ORDER — FOLIC ACID 5 MG/ML IJ SOLN
1.0000 mg | Freq: Every day | INTRAMUSCULAR | Status: DC
  Administered 2017-09-04 – 2017-09-05 (×2): 1 mg via INTRAVENOUS
  Filled 2017-09-03 (×3): qty 0.2

## 2017-09-03 MED ORDER — SODIUM CHLORIDE 0.9 % IV SOLN
1.0000 g | INTRAVENOUS | Status: DC
  Administered 2017-09-04 – 2017-09-07 (×4): 1 g via INTRAVENOUS
  Filled 2017-09-03 (×5): qty 10

## 2017-09-03 MED ORDER — SODIUM CHLORIDE 0.9 % IV SOLN
500.0000 mg | INTRAVENOUS | Status: DC
  Administered 2017-09-04 – 2017-09-05 (×2): 500 mg via INTRAVENOUS
  Filled 2017-09-03 (×3): qty 500

## 2017-09-03 MED ORDER — ACETAMINOPHEN 650 MG RE SUPP
650.0000 mg | Freq: Once | RECTAL | Status: AC
  Administered 2017-09-03: 650 mg via RECTAL
  Filled 2017-09-03: qty 1

## 2017-09-03 MED ORDER — BUPROPION HCL ER (SR) 100 MG PO TB12: 100.0000 mg | ORAL_TABLET | Freq: Every morning | ORAL | Status: DC

## 2017-09-03 MED ORDER — ASPIRIN EC 81 MG PO TBEC
81.0000 mg | DELAYED_RELEASE_TABLET | Freq: Every day | ORAL | Status: DC
  Filled 2017-09-03: qty 1

## 2017-09-03 MED ORDER — LORAZEPAM 2 MG/ML IJ SOLN
2.0000 mg | INTRAMUSCULAR | Status: DC | PRN
Start: 2017-09-03 — End: 2017-09-04

## 2017-09-03 NOTE — ED Provider Notes (Signed)
Patient care assumed at 1600. Patient with history of EtOH abuse here with nausea, vomiting, diarrhea and abdominal pain. She did have a syncopal episode prior to ED arrival. Chest x-ray was concerning for pneumonia and she was treated with antibiotics. Patient is pending CT abdomen and pelvis.  On assessment she is lethargic and arousable to painful stimuli. She does have generalized abdominal tenderness as well as a cough productive of sputum. She is mildly hypotensive in the department, will treat with additional IV fluid administration, replete potassium.   Patients blood pressure did improve after IV fluid hydration, Narcan administration. CT scan with evidence of colitis, no additional findings. Hospitalist consulted for admission for ongoing treatment.  CRITICAL CARE Performed by: Tilden FossaElizabeth Isiaih Hollenbach   Total critical care time: 35 minutes  Critical care time was exclusive of separately billable procedures and treating other patients.  Critical care was necessary to treat or prevent imminent or life-threatening deterioration.  Critical care was time spent personally by me on the following activities: development of treatment plan with patient and/or surrogate as well as nursing, discussions with consultants, evaluation of patient's response to treatment, examination of patient, obtaining history from patient or surrogate, ordering and performing treatments and interventions, ordering and review of laboratory studies, ordering and review of radiographic studies, pulse oximetry and re-evaluation of patient's condition.    Tilden Fossaees, Moksha Dorgan, MD 09/03/17 [phone]

## 2017-09-03 NOTE — ED Notes (Signed)
Writer notified EDP Reese of abnormal I-stat lactic result.  

## 2017-09-03 NOTE — ED Notes (Signed)
Pt. Placed on cooling blanket. Will continue to monitor.

## 2017-09-03 NOTE — ED Provider Notes (Signed)
[facility] Provider Note   CSN: [id number] Arrival date & time: 09/03/17  1134     History   Chief Complaint Chief Complaint  Patient presents with  . Abdominal Pain  . Diarrhea  . Weakness    HPI Carla Bowen is a 62 y.o. female.  HPI Patient presents with diarrhea and abdominal pain.  Began last night.  Also has a cough.  Is on chronic oxycodone and was hypotensive.  States her abdomen hurts.  Has some nausea without frank vomiting.  States her neck hurts but her neck always hurts.  No fevers.  States she does drink heavily.  Pain is severe and worse on her lower abdomen.  States her abdomen is normally not this large. Past Medical History:  Diagnosis Date  . Anxiety   . Depression   . Fibromyalgia   . High cholesterol   . Hypertension   . Stroke [facility](HCC)     Patient Active Problem List   Diagnosis Date Noted  . Alcohol use disorder, severe, dependence (HCC) 06/19/2016  . 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/13/2016  . Chest tightness   . History of stroke   . Left hemiparesis (HCC)   . Stroke (HCC)   . Chest pain 11/03/2014  . UTI (lower urinary tract infection) 11/03/2014  . HTN (hypertension) 11/03/2014  . ICH (intracerebral hemorrhage) (HCC) 05/30/2013  . Spastic hemiplegia affecting nondominant side (HCC) 05/30/2013  . Abnormality of gait 05/30/2013  . 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 Disturbance of skin sensation 05/30/2013    Past Surgical History:  Procedure Laterality Date  . CESAREAN SECTION       OB History    Gravida  1   Para  1   Term  1   Preterm      AB      Living        SAB      TAB      Ectopic      Multiple      Live Births               Home Medications    Prior to Admission medications   Medication Sig Start Date End Date Taking? Authorizing Provider  amLODipine (NORVASC) 10 MG tablet Take 1 tablet (10 mg total) by mouth daily. Patient taking  differently: Take 10 mg by mouth at bedtime.  06/25/13   Micki RileySethi, Pramod S, MD  ARIPiprazole (ABILIFY) 5 MG tablet Take 5 mg by mouth daily. 03/21/17   [provider]  aspirin EC 81 MG tablet Take 81 mg by mouth daily.    [provider]  chlorthalidone (HYGROTON) 25 MG tablet Take 25 mg by mouth at bedtime.    [provider]  cyclobenzaprine (FLEXERIL) 10 MG tablet Take 10 mg by mouth 3 (three) times daily. 05/30/17   [provider]  cycloSPORINE (RESTASIS) 0.05 % ophthalmic emulsion Place 1 drop into both eyes 2 (two) times daily.    [provider]  DULoxetine (CYMBALTA) 30 MG capsule Take 30 mg by mouth daily. 06/06/17   [provider]  fluticasone (FLONASE) 50 MCG/ACT nasal spray Place 2 sprays into both nostrils 2 (two) times daily. 02/01/17   [provider]  gabapentin (NEURONTIN) 100 MG capsule Take 100 mg by mouth daily. 05/10/17   [provider]  HYDROcodone-acetaminophen (NORCO/VICODIN) 5-325 MG tablet Take 1 tablet by mouth 4 (four) times daily as needed for pain.  05/30/17   [provider]  lamoTRIgine (LAMICTAL) 25 MG tablet Take 25 mg by mouth 2 (two) times daily. 04/16/17   [provider]  lansoprazole (PREVACID) 30 MG capsule Take 1 capsule (30 mg total) by mouth daily at 12 noon. Patient not taking: Reported on 02/20/2017 08/08/13   Benjiman Core, MD  lisinopril (PRINIVIL,ZESTRIL) 20 MG tablet Take 1 tablet (20 mg total) by mouth daily. Patient taking differently: Take 40 mg by mouth at bedtime.  09/23/13   Micki Riley, MD  omeprazole (PRILOSEC) 40 MG capsule Take 40 mg by mouth 2 (two) times daily. 06/05/17   [provider]  potassium chloride SA (K-DUR,KLOR-CON) 20 MEQ tablet Take 1 tablet (20 mEq total) by mouth 2 (two) times daily for 5 days. Patient not taking: Reported on 06/14/2017 05/24/17 05/29/17  Maia Plan, MD  rosuvastatin (CRESTOR) 10 MG tablet Take 10 mg by mouth at  bedtime.     [provider]    Family History Family History  Problem Relation Age of Onset  . Lung cancer Father   . Diabetes Brother   . Heart attack Brother   . Hypertension Brother   . Hypertension Sister     Social History Social History   Tobacco Use  . Smoking status: Current Every Day Smoker    Packs/day: 0.50    Years: 20.00    Pack years: 10.00  . Smokeless tobacco: Never Used  Substance Use Topics  . Alcohol use: Yes    Alcohol/week: 6.0 oz    Types: 5 Cans of beer, 5 Shots of liquor per week    Comment: BAC was 247  . Drug use: No     Allergies   Ciprofloxacin; Codeine; and Sulfa antibiotics   Review of Systems Review of Systems  Constitutional: Positive for appetite change. Negative for fever.  HENT: Negative for congestion.   Respiratory: Positive for cough.   Gastrointestinal: Positive for abdominal distention, abdominal pain, diarrhea and nausea.  Genitourinary: Negative for flank pain.  Musculoskeletal: Positive for neck pain.  Neurological: Negative for seizures.  Hematological: Negative for adenopathy.  Psychiatric/Behavioral: Negative for confusion.     Physical Exam Updated Vital Signs BP 106/88   Pulse (!) 104   Temp 97.7 F (36.5 C) (Oral)   Resp 15   Ht 5\' 1"  (1.549 m)   Wt 51.3 kg (113 lb)   LMP  (LMP Unknown)   SpO2 92%   BMI 21.35 kg/m   Physical Exam  Constitutional: She appears well-developed.  HENT:  Head: Normocephalic.  Cardiovascular: Regular rhythm.  Pulmonary/Chest:    Diffuse harsh breath sounds.  Abdominal: She exhibits distension.  Distention with somewhat diffuse tenderness.  No hernia palpated.  Neurological: She is alert.  Skin: Skin is warm. Capillary refill takes less than 2 seconds.     ED Treatments / Results  Labs (all labs ordered are listed, but only abnormal results are displayed) Labs Reviewed  COMPREHENSIVE METABOLIC PANEL - Abnormal; Notable for the following components:       Result Value   Potassium 2.8 (*)    Glucose, Bld 133 (*)    BUN 35 (*)    Creatinine, Ser 2.30 (*)    AST 93 (*)    Alkaline Phosphatase 446 (*)    GFR calc non Af Amer 22 (*)    GFR calc Af Amer 26 (*)    All other components within normal limits  CBC WITH DIFFERENTIAL/PLATELET - Abnormal; Notable  for the following components:   WBC 3.9 (*)    Hemoglobin 15.4 (*)    HCT 47.5 (*)    Lymphs Abs 0.5 (*)    All other components within normal limits  I-STAT CG4 LACTIC ACID, ED - Abnormal; Notable for the following components:   Lactic Acid, Venous 3.23 (*)    All other components within normal limits  I-STAT CG4 LACTIC ACID, ED - Abnormal; Notable for the following components:   Lactic Acid, Venous 2.04 (*)    All other components within normal limits  URINALYSIS, ROUTINE W REFLEX MICROSCOPIC  ETHANOL  AMMONIA    EKG EKG Interpretation  Date/Time:  Sunday September 03 2017 12:02:53 EDT Ventricular Rate:  105 PR Interval:    QRS Duration: 82 QT Interval:  382 QTC Calculation: 505 R Axis:   45 Text Interpretation:  Sinus tachycardia Abnormal R-wave progression, early transition Borderline prolonged QT interval Confirmed by Benjiman Core [phone]) on 09/03/2017 1:16:43 PM   Radiology Dg Chest Portable 1 View  Result Date: 09/03/2017 CLINICAL DATA:  Fever. EXAM: PORTABLE CHEST 1 VIEW COMPARISON:  Radiographs of May 24, 2017. FINDINGS: The heart size and mediastinal contours are within normal limits. No pneumothorax or pleural effusion is noted. Right lung is clear. Minimal left basilar atelectasis or infiltrate is noted. The visualized skeletal structures are unremarkable. IMPRESSION: Minimal left basilar atelectasis or infiltrate is noted. Electronically Signed   By: Lupita Raider, M.D.   On: 09/03/2017 14:11   Dg Abd Portable 1 View  Result Date: 09/03/2017 CLINICAL DATA:  Acute generalized abdominal pain, diarrhea. EXAM: PORTABLE ABDOMEN - 1 VIEW COMPARISON:  Radiograph of  Jul 25, 2017. FINDINGS: The bowel gas pattern is normal. No radio-opaque calculi or other significant radiographic abnormality are seen. IMPRESSION: No evidence of bowel obstruction or ileus. Electronically Signed   By: Lupita Raider, M.D.   On: 09/03/2017 14:09    Procedures Procedures (including critical care time)  Medications Ordered in ED Medications  cefTRIAXone (ROCEPHIN) 1 g in sodium chloride 0.9 % 100 mL IVPB (has no administration in time range)  azithromycin (ZITHROMAX) 500 mg in sodium chloride 0.9 % 250 mL IVPB (has no administration in time range)  sodium chloride 0.9 % bolus 1,000 mL (has no administration in time range)  sodium chloride 0.9 % bolus 1,000 mL (0 mLs Intravenous Stopped 09/03/17 1355)     Initial Impression / Assessment and Plan / ED Course  I have reviewed the triage vital signs and the nursing notes.  Pertinent labs & imaging results that were available during my care of the patient were reviewed by me and considered in my medical decision making (see chart for details).     Patient with abdominal pain and diarrhea.  Also cough.  X-ray shows possible pneumonia.  Has worsening creatinine.  With abdominal pain and the diarrhea will get CT scan.  Care will be turned over to Dr. Madilyn Hook.  Hypotension likely due to dehydration and less likely severe sepsis.  Final Clinical Impressions(s) / ED Diagnoses   Final diagnoses:  Abdominal pain, unspecified abdominal location  Pneumonia due to infectious organism, unspecified laterality, unspecified part of lung  AKI (acute kidney injury) [facility])    ED Discharge Orders    None       Benjiman Core, MD 09/03/17 1502

## 2017-09-03 NOTE — ED Notes (Signed)
One set of blood cultures drawn with IV start and at bedside.

## 2017-09-03 NOTE — ED Notes (Signed)
Ice bags applied under arms

## 2017-09-03 NOTE — ED Notes (Signed)
Selena BattenKim, MD paged about BP of 81/61 with MAP of 69.

## 2017-09-03 NOTE — ED Notes (Signed)
MD notified of patients abnormal vital signs. 

## 2017-09-03 NOTE — ED Notes (Signed)
Patient transported to CT 

## 2017-09-03 NOTE — ED Triage Notes (Signed)
Per [facility] pt coming from home c/o diarrhea and abdominal pain onset of last night. Patient arrives covered in feces. States taking oxy PTA for her chronic pain. Hypotensive at 60/40 with EMS 300CC NS given en route.

## 2017-09-03 NOTE — ED Notes (Signed)
Patient appears more somnolent and not as easy to arouse. MD notified.

## 2017-09-03 NOTE — H&P (Addendum)
TRH H&P   Patient Demographics:    Carla Bowen, is a 62 y.o. female  MRN: [id number]   DOB - June 21, 1955  Admit Date - 09/03/2017  Outpatient Primary MD for the patient is Hayden Rasmussen, MD  Referring MD/NP/PA:   Dr. Ralene Bathe  Outpatient Specialists:     Patient coming from:   home  Chief Complaint  Patient presents with  . Abdominal Pain  . Diarrhea  . Weakness      HPI:    Carla Bowen  is a 62 y.o. female, w anxiety/ depression, hypertension, hyperlipidemia, CVA, Fibromyalgia, presents with altered mental status, and c/o abdominal pain, and diarrhea.  The abdominal pain was generalized, and diarrhea,   In Ed, Temp 103, P 110, bp 127/108 Pox 94%  Xray  IMPRESSION: No evidence of bowel obstruction or ileus.  Chest xray IMPRESSION: Minimal left basilar atelectasis or infiltrate is noted.  CT abd pelvis IMPRESSION: Wall thickening of descending and proximal sigmoid colon is noted consistent with infectious or inflammatory colitis.  Bilateral nonobstructive nephrolithiasis.  Mild bilateral posterior basilar subsegmental atelectasis.  Na 139, K 2.8 Bun 35, Creatinine 2.3 Glucose 133  Ast 93, Alt 40 Alk phos 446 T. Bili 0.9  Wbc 3.9, Hgb 15.4, Plt 222  Magnesium 2.6  Lactic acidosis 3.23 ETOH <10 Ammonia 23 Urinalysis negative Lipase 26 CPK 63   Pt will be admitted for sepsis (fever, tachycardia, elevated lactic acid) secondary to colitis vs pneumonia,       Review of systems:    In addition to the HPI above,   No Headache, No changes with Vision or hearing, No problems swallowing food or Liquids,  No Chest pain, Cough or Shortness of Breath,   No Blood in stool or Urine, No dysuria, No new skin rashes or bruises, No new joints pains-aches,  No new weakness, tingling, numbness in any extremity, No recent weight gain or  loss, No polyuria, polydypsia or polyphagia, No significant Mental Stressors.  A full 10 point Review of Systems was done, except as stated above, all other Review of Systems were negative.   With Past History of the following :    Past Medical History:  Diagnosis Date  . Anxiety   . Depression   . Fibromyalgia   . High cholesterol   . Hypertension   . Stroke [facility])       Past Surgical History:  Procedure Laterality Date  . CESAREAN SECTION        Social History:     Social History   Tobacco Use  . Smoking status: Current Every Day Smoker    Packs/day: 0.50    Years: 20.00    Pack years: 10.00  . Smokeless tobacco: Never Used  Substance Use Topics  . Alcohol use: Yes    Alcohol/week: 6.0 oz    Types: 5 Cans of beer, 5 Shots  of liquor per week    Comment: BAC was 247     Lives - at home  Mobility - walks by self   Family History :     Family History  Problem Relation Age of Onset  . Lung cancer Father   . Diabetes Brother   . Heart attack Brother   . Hypertension Brother   . Hypertension Sister        Home Medications:   Prior to Admission medications   Medication Sig Start Date End Date Taking? Authorizing Provider  ARIPiprazole (ABILIFY) 5 MG tablet Take 5 mg by mouth daily. 03/21/17  Yes [provider]  aspirin EC 81 MG tablet Take 81 mg by mouth daily.   Yes [provider]  buPROPion (WELLBUTRIN SR) 100 MG 12 hr tablet Take 100 mg by mouth every morning. 08/11/17  Yes [provider]  cyclobenzaprine (FLEXERIL) 10 MG tablet Take 10 mg by mouth at bedtime as needed for muscle spasms.  05/30/17  Yes [provider]  DULoxetine (CYMBALTA) 30 MG capsule Take 30 mg by mouth daily. 06/06/17  Yes [provider]  fluconazole (DIFLUCAN) 100 MG tablet Take 100 mg by mouth daily. 08/31/17  Yes [provider]  fluticasone (FLONASE) 50 MCG/ACT nasal spray Place 2 sprays into both nostrils 2 (two) times  daily. 02/01/17  Yes [provider]  gabapentin (NEURONTIN) 100 MG capsule Take 100 mg by mouth daily. 05/10/17  Yes [provider]  HYDROcodone-acetaminophen (NORCO/VICODIN) 5-325 MG tablet Take 1 tablet by mouth 4 (four) times daily as needed for pain. 05/30/17  Yes [provider]  lidocaine (XYLOCAINE) 5 % ointment Apply 1 application topically 4 (four) times daily as needed for rash. 08/30/17  Yes [provider]  lisinopril (PRINIVIL,ZESTRIL) 40 MG tablet Take 40 mg by mouth daily. 08/26/17  Yes [provider]  montelukast (SINGULAIR) 10 MG tablet Take 10 mg by mouth at bedtime. 08/31/17  Yes [provider]  omeprazole (PRILOSEC) 40 MG capsule Take 40 mg by mouth daily.  06/05/17  Yes [provider]  rosuvastatin (CRESTOR) 10 MG tablet Take 10 mg by mouth at bedtime.    Yes [provider]  valACYclovir (VALTREX) 500 MG tablet Take 500 mg by mouth 2 (two) times daily.  08/31/17  Yes [provider]  amLODipine (NORVASC) 10 MG tablet Take 1 tablet (10 mg total) by mouth daily. Patient not taking: Reported on 09/03/2017 06/25/13   Garvin Fila, MD  chlorthalidone (HYGROTON) 25 MG tablet Take 25 mg by mouth at bedtime.    [provider]  lamoTRIgine (LAMICTAL) 25 MG tablet Take 25 mg by mouth 2 (two) times daily. 04/16/17   [provider]  lansoprazole (PREVACID) 30 MG capsule Take 1 capsule (30 mg total) by mouth daily at 12 noon. Patient not taking: Reported on 02/20/2017 08/08/13   Davonna Belling, MD  lisinopril (PRINIVIL,ZESTRIL) 20 MG tablet Take 1 tablet (20 mg total) by mouth daily. Patient not taking: Reported on 09/03/2017 09/23/13   Garvin Fila, MD  potassium chloride SA (K-DUR,KLOR-CON) 20 MEQ tablet Take 1 tablet (20 mEq total) by mouth 2 (two) times daily for 5 days. Patient not taking: Reported on 06/14/2017 05/24/17 05/29/17  Long, Wonda Olds, MD     Allergies:     Allergies   Allergen Reactions  . Ciprofloxacin Itching, Nausea And Vomiting and Rash  . Codeine Hives  . Sulfa Antibiotics Hives  . Tramadol Hives  Physical Exam:   Vitals  Blood pressure (!) 127/108, pulse (!) 110, temperature (!) 103 F (39.4 C), temperature source Oral, resp. rate 20, height '5\' 1"'  (1.549 m), weight 51.3 kg (113 lb), SpO2 94 %.   1. General  lying in bed in NAD,    2. Normal affect and insight, Not Suicidal or Homicidal, Awake Alert, Oriented X 3.  3. No F.N deficits, ALL C.Nerves Intact, left hemiplegia Sensation intact all 4 extremities,  .  4. Ears and Eyes appear Normal, Conjunctivae clear, PERRLA. Moist Oral Mucosa.  5. Supple Neck, No JVD, No cervical lymphadenopathy appriciated, No Carotid Bruits.  6. Symmetrical Chest wall movement, Good air movement bilaterally, CTAB.  7. RRR, No Gallops, Rubs or Murmurs, No Parasternal Heave.  8. Positive Bowel Sounds, Abdomen Soft, No tenderness, No organomegaly appriciated,No rebound -guarding or rigidity.  9.  No Cyanosis, Normal Skin Turgor, No Skin Rash or Bruise.  10. Good muscle tone,  joints appear normal , no effusions, Normal ROM.  11. No Palpable Lymph Nodes in Neck or Axillae      Data Review:    CBC Recent Labs  Lab 09/03/17 1210  WBC 3.9*  HGB 15.4*  HCT 47.5*  PLT 222  MCV 99.8  MCH 32.4  MCHC 32.4  RDW 14.4  LYMPHSABS 0.5*  MONOABS 0.5  EOSABS 0.0  BASOSABS 0.0   ------------------------------------------------------------------------------------------------------------------  Chemistries  Recent Labs  Lab 09/03/17 1210  NA 139  K 2.8*  CL 103  CO2 22  GLUCOSE 133*  BUN 35*  CREATININE 2.30*  CALCIUM 9.4  MG 2.6*  AST 93*  ALT 40  ALKPHOS 446*  BILITOT 0.9   ------------------------------------------------------------------------------------------------------------------ estimated creatinine clearance is 19.9 mL/min (A) (by C-G formula based on SCr of 2.3 mg/dL  (H)). ------------------------------------------------------------------------------------------------------------------ No results for input(s): TSH, T4TOTAL, T3FREE, THYROIDAB in the last 72 hours.  Invalid input(s): FREET3  Coagulation profile No results for input(s): INR, PROTIME in the last 168 hours. ------------------------------------------------------------------------------------------------------------------- No results for input(s): DDIMER in the last 72 hours. -------------------------------------------------------------------------------------------------------------------  Cardiac Enzymes No results for input(s): CKMB, TROPONINI, MYOGLOBIN in the last 168 hours.  Invalid input(s): CK ------------------------------------------------------------------------------------------------------------------    Component Value Date/Time   BNP 16.0 02/15/2016 1518     ---------------------------------------------------------------------------------------------------------------  Urinalysis    Component Value Date/Time   COLORURINE AMBER (A) 09/03/2017 1557   APPEARANCEUR HAZY (A) 09/03/2017 1557   LABSPEC 1.017 09/03/2017 1557   PHURINE 5.0 09/03/2017 1557   GLUCOSEU NEGATIVE 09/03/2017 1557   HGBUR NEGATIVE 09/03/2017 1557   BILIRUBINUR NEGATIVE 09/03/2017 1557   KETONESUR NEGATIVE 09/03/2017 1557   PROTEINUR 30 (A) 09/03/2017 1557   UROBILINOGEN 0.2 11/03/2014 1437   NITRITE NEGATIVE 09/03/2017 1557   LEUKOCYTESUR NEGATIVE 09/03/2017 1557    ----------------------------------------------------------------------------------------------------------------   Imaging Results:    Ct Abdomen Pelvis Wo Contrast  Result Date: 09/03/2017 CLINICAL DATA:  Generalized abdominal pain, diarrhea. EXAM: CT ABDOMEN AND PELVIS WITHOUT CONTRAST TECHNIQUE: Multidetector CT imaging of the abdomen and pelvis was performed following the standard protocol without IV contrast. COMPARISON:   CT scan of February 20, 2017. FINDINGS: Lower chest: Mild bilateral posterior basilar subsegmental atelectasis is noted. Hepatobiliary: No focal liver abnormality is seen. No gallstones, gallbladder wall thickening, or biliary dilatation. Pancreas: Unremarkable. No pancreatic ductal dilatation or surrounding inflammatory changes. Spleen: Normal in size without focal abnormality. Adrenals/Urinary Tract: Adrenal glands are unremarkable. Bilateral nonobstructive nephrolithiasis is noted. No hydronephrosis or renal obstruction is noted. No ureteral calculi are noted. Urinary  bladder is decompressed. Stomach/Bowel: The stomach appears normal. The appendix appears normal. Stool is noted throughout the colon. Wall thickening of descending and proximal sigmoid colon is noted consistent with infectious or inflammatory colitis. Vascular/Lymphatic: Aortic atherosclerosis. No enlarged abdominal or pelvic lymph nodes. Reproductive: Uterus and bilateral adnexa are unremarkable. Other: Small amount of free fluid is noted in the pelvis. No hernia is noted. Musculoskeletal: No acute or significant osseous findings. IMPRESSION: Wall thickening of descending and proximal sigmoid colon is noted consistent with infectious or inflammatory colitis. Bilateral nonobstructive nephrolithiasis. Mild bilateral posterior basilar subsegmental atelectasis. Aortic Atherosclerosis (ICD10-I70.0). Electronically Signed   By: Marijo Conception, M.D.   On: 09/03/2017 18:25   Dg Chest Portable 1 View  Result Date: 09/03/2017 CLINICAL DATA:  Fever. EXAM: PORTABLE CHEST 1 VIEW COMPARISON:  Radiographs of May 24, 2017. FINDINGS: The heart size and mediastinal contours are within normal limits. No pneumothorax or pleural effusion is noted. Right lung is clear. Minimal left basilar atelectasis or infiltrate is noted. The visualized skeletal structures are unremarkable. IMPRESSION: Minimal left basilar atelectasis or infiltrate is noted. Electronically  Signed   By: Marijo Conception, M.D.   On: 09/03/2017 14:11   Dg Abd Portable 1 View  Result Date: 09/03/2017 CLINICAL DATA:  Acute generalized abdominal pain, diarrhea. EXAM: PORTABLE ABDOMEN - 1 VIEW COMPARISON:  Radiograph of Jul 25, 2017. FINDINGS: The bowel gas pattern is normal. No radio-opaque calculi or other significant radiographic abnormality are seen. IMPRESSION: No evidence of bowel obstruction or ileus. Electronically Signed   By: Marijo Conception, M.D.   On: 09/03/2017 14:09       Assessment & Plan:    Principal Problem:   Sepsis ([facility]) Active Problems:   Abdominal pain   Diarrhea   Fever   Tachycardia   ARF (acute renal failure) (HCC)    Sepsis most likely secondary to colitis Ddx colitis vs pneumonia Blood culture x2 Rocephin 1gm iv qday, Zithromax 57m iv qday Flagyl 5030miv tid Hydrate with ns iv Check cbc, cmp in am  Diarrhea Check c. Diff Check GI pathogen panel Flagyl 50043mv tid  ARF Check urine sodium, urine creatinine, urine eosinophils STOP chlorthalidone STOP Lisinopril Hydrate with NS iv Check cmp in am  Hypokalemia Replete Check cmp , magnesium in am  Tachycardia Tele Trop I q6h x3 Check cpk , mb Check cardiac echo  Hypertension Hold norvasc 73m35m qday due to soft BP  Hyperlipidemia/ CVA Cont Crestor [id number] Cont aspirin 81mg 64mday    Mood disorder Cont wellbutrin 100mg p72may Cont abilify 5mg po 73my Cont gabapentin 100mg poq12m Cont cymbalta 30mg po q87mCont lamictal 25mg po [id number] Cont PPI (consider change to h2 blocker if continues to have difficulty with renal function)  AMS Check ammonia level ?due to pain medication HOLD any pain medication.   Abnormal liver function Check cmp in am Check acute hepatitis panel   H/o alcoholism CIWA  DVT Prophylaxis Heparin  - SCDs   AM Labs Ordered, also please review Full Orders  Family Communication: Admission, patients condition and plan of care  including tests being ordered have been discussed with the patient  who indicate understanding and agree with the plan and Code Status.  Code Status  FULL CODE  Likely DC to  home  Condition GUARDED    Consults called:  none  Admission status: inpatient  Time spent in minutes : 60   Kishana Battey Candler  M.D on 09/03/2017 at 8:27 PM  Between 7am to 7pm - Pager - [phone]  . After 7pm go to [email or web address] - password [facility]  [facility] - Office  [phone]

## 2017-09-04 ENCOUNTER — Inpatient Hospital Stay (HOSPITAL_COMMUNITY): Payer: Medicare Other

## 2017-09-04 DIAGNOSIS — I10 Essential (primary) hypertension: Secondary | ICD-10-CM

## 2017-09-04 DIAGNOSIS — E875 Hyperkalemia: Secondary | ICD-10-CM

## 2017-09-04 DIAGNOSIS — K529 Noninfective gastroenteritis and colitis, unspecified: Secondary | ICD-10-CM

## 2017-09-04 DIAGNOSIS — J9601 Acute respiratory failure with hypoxia: Secondary | ICD-10-CM

## 2017-09-04 DIAGNOSIS — F1010 Alcohol abuse, uncomplicated: Secondary | ICD-10-CM

## 2017-09-04 DIAGNOSIS — R079 Chest pain, unspecified: Secondary | ICD-10-CM

## 2017-09-04 LAB — POCT I-STAT 3, ART BLOOD GAS (G3+)
Acid-base deficit: 13 mmol/L — ABNORMAL HIGH (ref 0.0–2.0)
Acid-base deficit: 9 mmol/L — ABNORMAL HIGH (ref 0.0–2.0)
Bicarbonate: 16 mmol/L — ABNORMAL LOW (ref 20.0–28.0)
Bicarbonate: 17.4 mmol/L — ABNORMAL LOW (ref 20.0–28.0)
O2 Saturation: 92 %
O2 Saturation: 96 %
Patient temperature: 98.3
Patient temperature: 99.6
TCO2: 17 mmol/L — ABNORMAL LOW (ref 22–32)
TCO2: 19 mmol/L — ABNORMAL LOW (ref 22–32)
pCO2 arterial: 32.4 mmHg (ref 32.0–48.0)
pCO2 arterial: 62.8 mmHg — ABNORMAL HIGH (ref 32.0–48.0)
pH, Arterial: 7.055 — CL (ref 7.350–7.450)
pH, Arterial: 7.303 — ABNORMAL LOW (ref 7.350–7.450)
pO2, Arterial: 87 mmHg (ref 83.0–108.0)
pO2, Arterial: 92 mmHg (ref 83.0–108.0)

## 2017-09-04 LAB — GASTROINTESTINAL PANEL BY PCR, STOOL (REPLACES STOOL CULTURE)

## 2017-09-04 LAB — RESPIRATORY PANEL BY PCR

## 2017-09-04 LAB — RENAL FUNCTION PANEL
Albumin: 2.1 g/dL — ABNORMAL LOW (ref 3.5–5.0)
Anion gap: 11 (ref 5–15)
BUN: 41 mg/dL — ABNORMAL HIGH (ref 6–20)
CO2: 13 mmol/L — ABNORMAL LOW (ref 22–32)
Calcium: 7.8 mg/dL — ABNORMAL LOW (ref 8.9–10.3)
Chloride: 120 mmol/L — ABNORMAL HIGH (ref 101–111)
Creatinine, Ser: 1.36 mg/dL — ABNORMAL HIGH (ref 0.44–1.00)
GFR calc Af Amer: 48 mL/min — ABNORMAL LOW (ref >60)
GFR calc non Af Amer: 42 mL/min — ABNORMAL LOW (ref >60)
Glucose, Bld: 81 mg/dL (ref 65–99)
Phosphorus: 3.5 mg/dL (ref 2.5–4.6)
Potassium: 4.2 mmol/L (ref 3.5–5.1)
Sodium: 144 mmol/L (ref 135–145)

## 2017-09-04 LAB — BLOOD GAS, ARTERIAL
Acid-base deficit: 9.9 mmol/L — ABNORMAL HIGH (ref 0.0–2.0)
Bicarbonate: 14.5 mmol/L — ABNORMAL LOW (ref 20.0–28.0)
Drawn by: 30136
FIO2: 44
O2 Saturation: 92.2 %
Patient temperature: 99.4
pCO2 arterial: 26.9 mmHg — ABNORMAL LOW (ref 32.0–48.0)
pH, Arterial: 7.352 (ref 7.350–7.450)
pO2, Arterial: 66 mmHg — ABNORMAL LOW (ref 83.0–108.0)

## 2017-09-04 LAB — COMPREHENSIVE METABOLIC PANEL
ALT: 23 U/L (ref 14–54)
AST: 53 U/L — ABNORMAL HIGH (ref 15–41)
Albumin: 2 g/dL — ABNORMAL LOW (ref 3.5–5.0)
Alkaline Phosphatase: 231 U/L — ABNORMAL HIGH (ref 38–126)
Anion gap: 9 (ref 5–15)
BUN: 36 mg/dL — ABNORMAL HIGH (ref 6–20)
CO2: 16 mmol/L — ABNORMAL LOW (ref 22–32)
Calcium: 6.7 mg/dL — ABNORMAL LOW (ref 8.9–10.3)
Chloride: 119 mmol/L — ABNORMAL HIGH (ref 101–111)
Creatinine, Ser: 1.75 mg/dL — ABNORMAL HIGH (ref 0.44–1.00)
GFR calc Af Amer: 36 mL/min — ABNORMAL LOW (ref >60)
GFR calc non Af Amer: 31 mL/min — ABNORMAL LOW (ref >60)
Glucose, Bld: 93 mg/dL (ref 65–99)
Potassium: 5.7 mmol/L — ABNORMAL HIGH (ref 3.5–5.1)
Sodium: 144 mmol/L (ref 135–145)
Total Bilirubin: 0.8 mg/dL (ref 0.3–1.2)
Total Protein: 4.3 g/dL — ABNORMAL LOW (ref 6.5–8.1)

## 2017-09-04 LAB — MAGNESIUM
Magnesium: 1.7 mg/dL (ref 1.7–2.4)
Magnesium: 2.7 mg/dL — ABNORMAL HIGH (ref 1.7–2.4)

## 2017-09-04 LAB — BASIC METABOLIC PANEL
Anion gap: 10 (ref 5–15)
BUN: 39 mg/dL — ABNORMAL HIGH (ref 6–20)
CO2: 19 mmol/L — ABNORMAL LOW (ref 22–32)
Calcium: 7.4 mg/dL — ABNORMAL LOW (ref 8.9–10.3)
Chloride: 117 mmol/L — ABNORMAL HIGH (ref 101–111)
Creatinine, Ser: 1.96 mg/dL — ABNORMAL HIGH (ref 0.44–1.00)
GFR calc Af Amer: 31 mL/min — ABNORMAL LOW (ref >60)
GFR calc non Af Amer: 27 mL/min — ABNORMAL LOW (ref >60)
Glucose, Bld: 98 mg/dL (ref 65–99)
Potassium: 3.1 mmol/L — ABNORMAL LOW (ref 3.5–5.1)
Sodium: 146 mmol/L — ABNORMAL HIGH (ref 135–145)

## 2017-09-04 LAB — CORTISOL
Cortisol, Plasma: 28.2 ug/dL
Cortisol, Plasma: 25.5 ug/dL

## 2017-09-04 LAB — CBC
HCT: 35.5 % — ABNORMAL LOW (ref 36.0–46.0)
Hemoglobin: 11.4 g/dL — ABNORMAL LOW (ref 12.0–15.0)
MCH: 32.4 pg (ref 26.0–34.0)
MCHC: 32.1 g/dL (ref 30.0–36.0)
MCV: 100.9 fL — ABNORMAL HIGH (ref 78.0–100.0)
Platelets: 179 10*3/uL (ref 150–400)
RBC: 3.52 MIL/uL — ABNORMAL LOW (ref 3.87–5.11)
RDW: 14.8 % (ref 11.5–15.5)
WBC: 1.9 10*3/uL — ABNORMAL LOW (ref 4.0–10.5)

## 2017-09-04 LAB — URINE CULTURE: Culture: NO GROWTH

## 2017-09-04 LAB — GLUCOSE, CAPILLARY
Glucose-Capillary: 122 mg/dL — ABNORMAL HIGH (ref 65–99)
Glucose-Capillary: 74 mg/dL (ref 65–99)
Glucose-Capillary: 74 mg/dL (ref 65–99)
Glucose-Capillary: 77 mg/dL (ref 65–99)
Glucose-Capillary: 78 mg/dL (ref 65–99)
Glucose-Capillary: 97 mg/dL (ref 65–99)

## 2017-09-04 LAB — RAPID URINE DRUG SCREEN, HOSP PERFORMED
Amphetamines: NOT DETECTED
Benzodiazepines: NOT DETECTED
Cocaine: NOT DETECTED
Opiates: POSITIVE — AB
Tetrahydrocannabinol: NOT DETECTED

## 2017-09-04 LAB — LACTIC ACID, PLASMA
Lactic Acid, Venous: 2.3 mmol/L (ref 0.5–1.9)
Lactic Acid, Venous: 2.7 mmol/L (ref 0.5–1.9)
Lactic Acid, Venous: 1.3 mmol/L (ref 0.5–1.9)
Lactic Acid, Venous: 1.8 mmol/L (ref 0.5–1.9)

## 2017-09-04 LAB — TROPONIN I: Troponin I: <0.03 ng/mL (ref <0.03)

## 2017-09-04 LAB — STREP PNEUMONIAE URINARY ANTIGEN: Strep Pneumo Urinary Antigen: NEGATIVE

## 2017-09-04 LAB — ECHOCARDIOGRAM COMPLETE
Height: 61 in
Weight: 1985.9 oz

## 2017-09-04 LAB — MRSA PCR SCREENING: MRSA by PCR: NEGATIVE

## 2017-09-04 LAB — PROCALCITONIN
Procalcitonin: 12.1 ng/mL
Procalcitonin: 13.61 ng/mL

## 2017-09-04 LAB — SODIUM, URINE, RANDOM: Sodium, Ur: 40 mmol/L

## 2017-09-04 LAB — HIV ANTIBODY (ROUTINE TESTING W REFLEX): HIV Screen 4th Generation wRfx: NONREACTIVE

## 2017-09-04 MED ORDER — MIDAZOLAM HCL 2 MG/2ML IJ SOLN
1.0000 mg | INTRAMUSCULAR | Status: AC | PRN
  Administered 2017-09-07 (×3): 1 mg via INTRAVENOUS
  Filled 2017-09-04 (×2): qty 2

## 2017-09-04 MED ORDER — FAMOTIDINE IN NACL 20-0.9 MG/50ML-% IV SOLN: 20.0000 mg | Freq: Two times a day (BID) | INTRAVENOUS | Status: DC

## 2017-09-04 MED ORDER — ROCURONIUM BROMIDE 50 MG/5ML IV SOLN
1.0000 mg/kg | Freq: Once | INTRAVENOUS | Status: AC
  Administered 2017-09-04: 56.3 mg via INTRAVENOUS

## 2017-09-04 MED ORDER — FAMOTIDINE IN NACL 20-0.9 MG/50ML-% IV SOLN
20.0000 mg | INTRAVENOUS | Status: DC
  Filled 2017-09-04: qty 50

## 2017-09-04 MED ORDER — SODIUM CHLORIDE 0.9 % IV SOLN
250.0000 mL | INTRAVENOUS | Status: DC | PRN
  Administered 2017-09-04 – 2017-09-22 (×8): 250 mL via INTRAVENOUS

## 2017-09-04 MED ORDER — MAGNESIUM SULFATE 2 GM/50ML IV SOLN
2.0000 g | Freq: Once | INTRAVENOUS | Status: AC
  Administered 2017-09-04: 2 g via INTRAVENOUS
  Filled 2017-09-04: qty 50

## 2017-09-04 MED ORDER — PHENYLEPHRINE HCL-NACL 10-0.9 MG/250ML-% IV SOLN
0.0000 ug/min | INTRAVENOUS | Status: DC
Start: 2017-09-04 — End: 2017-09-04
  Filled 2017-09-04: qty 250

## 2017-09-04 MED ORDER — FENTANYL CITRATE (PF) 100 MCG/2ML IJ SOLN
25.0000 ug | Freq: Once | INTRAMUSCULAR | Status: AC
  Administered 2017-09-04: 25 ug via INTRAVENOUS
  Filled 2017-09-04: qty 2

## 2017-09-04 MED ORDER — CHLORHEXIDINE GLUCONATE 0.12% ORAL RINSE (MEDLINE KIT)
15.0000 mL | Freq: Two times a day (BID) | OROMUCOSAL | Status: DC
  Administered 2017-09-04: 15 mL via OROMUCOSAL
  Administered 2017-09-05: 08:00:00 via OROMUCOSAL
  Administered 2017-09-05 – 2017-09-25 (×40): 15 mL via OROMUCOSAL

## 2017-09-04 MED ORDER — MIDAZOLAM HCL 2 MG/2ML IJ SOLN
1.0000 mg | INTRAMUSCULAR | Status: DC | PRN
  Administered 2017-09-06 – 2017-09-12 (×39): 1 mg via INTRAVENOUS
  Filled 2017-09-04 (×43): qty 2

## 2017-09-04 MED ORDER — FENTANYL CITRATE (PF) 100 MCG/2ML IJ SOLN
100.0000 ug | Freq: Once | INTRAMUSCULAR | Status: AC
  Administered 2017-09-04: 100 ug via INTRAVENOUS

## 2017-09-04 MED ORDER — LACTATED RINGERS IV SOLN
INTRAVENOUS | Status: DC
  Administered 2017-09-04 – 2017-09-05 (×2): via INTRAVENOUS
  Administered 2017-09-05: 75 mL/h via INTRAVENOUS
  Administered 2017-09-06: 03:00:00 via INTRAVENOUS

## 2017-09-04 MED ORDER — POTASSIUM CHLORIDE 10 MEQ/100ML IV SOLN: 10.0000 meq | INTRAVENOUS | Status: AC

## 2017-09-04 MED ORDER — ETOMIDATE 2 MG/ML IV SOLN: 16.0000 mg | Freq: Once | INTRAVENOUS | Status: DC

## 2017-09-04 MED ORDER — HEPARIN SODIUM (PORCINE) 5000 UNIT/ML IJ SOLN
5000.0000 [IU] | Freq: Three times a day (TID) | INTRAMUSCULAR | Status: DC
  Administered 2017-09-04 – 2017-09-25 (×64): 5000 [IU] via SUBCUTANEOUS
  Filled 2017-09-04 (×64): qty 1

## 2017-09-04 MED ORDER — ORAL CARE MOUTH RINSE
15.0000 mL | OROMUCOSAL | Status: DC
  Administered 2017-09-04 – 2017-09-21 (×167): 15 mL via OROMUCOSAL

## 2017-09-04 MED ORDER — FENTANYL 2500MCG IN NS 250ML (10MCG/ML) PREMIX INFUSION
25.0000 ug/h | INTRAVENOUS | Status: DC
  Administered 2017-09-04: 50 ug/h via INTRAVENOUS
  Administered 2017-09-05: 25 ug/h via INTRAVENOUS
  Administered 2017-09-06: 150 ug/h via INTRAVENOUS
  Administered 2017-09-07: 100 ug/h via INTRAVENOUS
  Administered 2017-09-08: 125 ug/h via INTRAVENOUS
  Administered 2017-09-08 – 2017-09-10 (×3): 200 ug/h via INTRAVENOUS
  Administered 2017-09-10: 400 ug/h via INTRAVENOUS
  Administered 2017-09-10: 250 ug/h via INTRAVENOUS
  Administered 2017-09-11: 400 ug/h via INTRAVENOUS
  Administered 2017-09-11: 350 ug/h via INTRAVENOUS
  Filled 2017-09-04 (×12): qty 250

## 2017-09-04 MED ORDER — FENTANYL CITRATE (PF) 100 MCG/2ML IJ SOLN: 50.0000 ug | Freq: Once | INTRAMUSCULAR | Status: DC

## 2017-09-04 MED ORDER — STERILE WATER FOR INJECTION IV SOLN
INTRAVENOUS | Status: DC
  Administered 2017-09-04 – 2017-09-06 (×3): via INTRAVENOUS
  Filled 2017-09-04 (×4): qty 850

## 2017-09-04 MED ORDER — FAMOTIDINE IN NACL 20-0.9 MG/50ML-% IV SOLN
20.0000 mg | INTRAVENOUS | Status: DC
  Administered 2017-09-04 – 2017-09-05 (×2): 20 mg via INTRAVENOUS
  Filled 2017-09-04 (×2): qty 50

## 2017-09-04 MED ORDER — FENTANYL CITRATE (PF) 100 MCG/2ML IJ SOLN
INTRAMUSCULAR | Status: AC
  Administered 2017-09-04: 100 ug via INTRAVENOUS
  Filled 2017-09-04: qty 2

## 2017-09-04 MED ORDER — DEXMEDETOMIDINE HCL IN NACL 400 MCG/100ML IV SOLN
0.4000 ug/kg/h | INTRAVENOUS | Status: DC
  Administered 2017-09-05: 0.8 ug/kg/h via INTRAVENOUS
  Administered 2017-09-05: 0.4 ug/kg/h via INTRAVENOUS
  Administered 2017-09-05: 1 ug/kg/h via INTRAVENOUS
  Administered 2017-09-05: 0.7 ug/kg/h via INTRAVENOUS
  Administered 2017-09-06: 1.2 ug/kg/h via INTRAVENOUS
  Administered 2017-09-06 (×2): 1 ug/kg/h via INTRAVENOUS
  Administered 2017-09-06: 0.9 ug/kg/h via INTRAVENOUS
  Administered 2017-09-07 (×2): 0.8 ug/kg/h via INTRAVENOUS
  Administered 2017-09-07 (×2): 1 ug/kg/h via INTRAVENOUS
  Administered 2017-09-07: 0.8 ug/kg/h via INTRAVENOUS
  Administered 2017-09-07: 1 ug/kg/h via INTRAVENOUS
  Administered 2017-09-08 – 2017-09-12 (×18): 1.2 ug/kg/h via INTRAVENOUS
  Filled 2017-09-04 (×27): qty 100

## 2017-09-04 MED ORDER — ETOMIDATE 2 MG/ML IV SOLN
16.0000 mg | Freq: Once | INTRAVENOUS | Status: AC
  Administered 2017-09-04: 16 mg via INTRAVENOUS

## 2017-09-04 MED ORDER — FENTANYL BOLUS VIA INFUSION
25.0000 ug | INTRAVENOUS | Status: DC | PRN
  Administered 2017-09-06 – 2017-09-09 (×7): 25 ug via INTRAVENOUS
  Filled 2017-09-04: qty 25

## 2017-09-04 MED ORDER — ORAL CARE MOUTH RINSE: 15.0000 mL | Freq: Two times a day (BID) | OROMUCOSAL | Status: DC

## 2017-09-04 NOTE — Consult Note (Addendum)
[facility] Surgery Consult Note  Carla Bowen 1955/09/11  [id number].    Requesting MD: Scatliffe Chief Complaint/Reason for Consult: colitis HPI:  Patient is a 62 year old female with PMH significant for CVA in 2014 on 81 mg ASA daily, EtOH abuse, tobacco abuse, HTN, depression/anxiety. She presented to [facility] 6/23 with abdominal pain, diarrhea, altered mental status. On my interview patient tells me abdominal pain started 2 weeks ago and was sharp in nature, located in epigastrium. Since onset she states pain has been constant, has worsened over the last 2 weeks in severity and now located across entire upper abdomen. Pain remains sharp in nature. Pain worsened with coughing or palpation. Pain is currently unchanged from when she was in the ER last night. Patient reports associated anorexia with 7 lbs weight loss in the last 2 weeks, bilious emesis this morning and intermittent nausea. Reports diarrhea for the last 2 weeks, but denies blood in stool or melena. Patient denies history of Crohn's disease or UC. States she did take a course of amoxicillin for a UTI which she finished 3 weeks ago. Patient states she takes a lot of pills, I do not see any blood thinners listed aside from ASA. Past abdominal surgery includes a C-section 28 years ago.  Patient reports she drinks liquor daily - 2 pints during the week and a larger bottle over the weekend. Smokes 1.5 ppd. Denies illicit drug use.   ROS: Review of Systems  Constitutional: Positive for weight loss.  Respiratory: Positive for cough and shortness of breath.   Cardiovascular: Negative for chest pain and palpitations.  Gastrointestinal: Positive for abdominal pain, diarrhea, nausea and vomiting. Negative for blood in stool and melena.  Genitourinary: Negative for dysuria, frequency and urgency.  All other systems reviewed and are negative.   Family History  Problem Relation Age of Onset  . Lung cancer Father   . Diabetes Brother    . Heart attack Brother   . Hypertension Brother   . Hypertension Sister     Past Medical History:  Diagnosis Date  . Anxiety   . Depression   . Fibromyalgia   . High cholesterol   . Hypertension   . Stroke [facility])     Past Surgical History:  Procedure Laterality Date  . CESAREAN SECTION      Social History:  reports that she has been smoking.  She has a 10.00 pack-year smoking history. She has never used smokeless tobacco. She reports that she drinks about 6.0 oz of alcohol per week. She reports that she does not use drugs.  Allergies:  Allergies  Allergen Reactions  . Ciprofloxacin Itching, Nausea And Vomiting and Rash  . Codeine Hives  . Sulfa Antibiotics Hives  . Tramadol Hives    Medications Prior to Admission  Medication Sig Dispense Refill  . ARIPiprazole (ABILIFY) 5 MG tablet Take 5 mg by mouth daily.    Marland Kitchen aspirin EC 81 MG tablet Take 81 mg by mouth daily.    Marland Kitchen buPROPion (WELLBUTRIN SR) 100 MG 12 hr tablet Take 100 mg by mouth every morning.  0  . cyclobenzaprine (FLEXERIL) 10 MG tablet Take 10 mg by mouth at bedtime as needed for muscle spasms.   0  . DULoxetine (CYMBALTA) 30 MG capsule Take 30 mg by mouth daily.    . fluconazole (DIFLUCAN) 100 MG tablet Take 100 mg by mouth daily.    . fluticasone (FLONASE) 50 MCG/ACT nasal spray Place 2 sprays into both nostrils 2 (  two) times daily.  3  . gabapentin (NEURONTIN) 100 MG capsule Take 100 mg by mouth daily.    Marland Kitchen HYDROcodone-acetaminophen (NORCO/VICODIN) 5-325 MG tablet Take 1 tablet by mouth 4 (four) times daily as needed for pain.  0  . lidocaine (XYLOCAINE) 5 % ointment Apply 1 application topically 4 (four) times daily as needed for rash.    . lisinopril (PRINIVIL,ZESTRIL) 40 MG tablet Take 40 mg by mouth daily.    . montelukast (SINGULAIR) 10 MG tablet Take 10 mg by mouth at bedtime.    Marland Kitchen omeprazole (PRILOSEC) 40 MG capsule Take 40 mg by mouth daily.     . rosuvastatin (CRESTOR) 10 MG tablet Take 10 mg by mouth  at bedtime.     . valACYclovir (VALTREX) 500 MG tablet Take 500 mg by mouth 2 (two) times daily.     Marland Kitchen amLODipine (NORVASC) 10 MG tablet Take 1 tablet (10 mg total) by mouth daily. (Patient not taking: Reported on 09/03/2017) 90 tablet 0  . chlorthalidone (HYGROTON) 25 MG tablet Take 25 mg by mouth at bedtime.    . lamoTRIgine (LAMICTAL) 25 MG tablet Take 25 mg by mouth 2 (two) times daily.  0  . lansoprazole (PREVACID) 30 MG capsule Take 1 capsule (30 mg total) by mouth daily at 12 noon. (Patient not taking: Reported on 02/20/2017) 14 capsule 0  . lisinopril (PRINIVIL,ZESTRIL) 20 MG tablet Take 1 tablet (20 mg total) by mouth daily. (Patient not taking: Reported on 09/03/2017) 30 tablet 0  . potassium chloride SA (K-DUR,KLOR-CON) 20 MEQ tablet Take 1 tablet (20 mEq total) by mouth 2 (two) times daily for 5 days. (Patient not taking: Reported on 06/14/2017) 10 tablet 0    Blood pressure (!) 147/65, pulse (!) 114, temperature 98.2 F (36.8 C), temperature source Oral, resp. rate (!) 30, height '5\' 1"'$  (1.549 m), weight 56.3 kg (124 lb 1.9 oz), SpO2 93 %. Physical Exam: Physical Exam  Constitutional: She appears well-developed and well-nourished. She is cooperative. She appears ill. Nasal cannula in place.  HENT:  Head: Normocephalic and atraumatic.  Right Ear: External ear normal.  Left Ear: External ear normal.  Nose: Nose normal.  Mouth/Throat: Mucous membranes are dry.  Eyes: Pupils are equal, round, and reactive to light. Conjunctivae, EOM and lids are normal. No scleral icterus.  Neck: Normal range of motion and phonation normal. Neck supple. No tracheal deviation present.  Cardiovascular: Regular rhythm. Tachycardia present.  Pulses:      Radial pulses are 2+ on the right side, and 2+ on the left side.       Dorsalis pedis pulses are 2+ on the right side, and 2+ on the left side.  Pulmonary/Chest: Tachypnea (on high flow nasal cannula) noted. She has decreased breath sounds (diminished  bilaterally ). She has no wheezes. She has no rhonchi. She has rales (bibasilar).  Abdominal: Soft. She exhibits distension. Bowel sounds are decreased. There is generalized tenderness. There is rebound. There is no rigidity and no guarding.  Musculoskeletal:  ROM grossly intact in bilateral upper and lower extremities. No gross deformities in bilateral upper and lower extremities.  Neurological: She is alert. She is disoriented. No sensory deficit.  Oriented to self and situation the majority of the time, but had some brief moments of confusion  Skin: Skin is warm, dry and intact.  Psychiatric: Her speech is normal. Her mood appears anxious.    Results for orders placed or performed during the hospital encounter of 09/03/17 (from the  past 48 hour(s))  Comprehensive metabolic panel     Status: Abnormal   Collection Time: 09/03/17 12:10 PM  Result Value Ref Range   Sodium 139 135 - 145 mmol/L   Potassium 2.8 (L) 3.5 - 5.1 mmol/L   Chloride 103 101 - 111 mmol/L   CO2 22 22 - 32 mmol/L   Glucose, Bld 133 (H) 65 - 99 mg/dL   BUN 35 (H) 6 - 20 mg/dL   Creatinine, Ser 2.30 (H) 0.44 - 1.00 mg/dL   Calcium 9.4 8.9 - 10.3 mg/dL   Total Protein 6.7 6.5 - 8.1 g/dL   Albumin 3.6 3.5 - 5.0 g/dL   AST 93 (H) 15 - 41 U/L   ALT 40 14 - 54 U/L   Alkaline Phosphatase 446 (H) 38 - 126 U/L   Total Bilirubin 0.9 0.3 - 1.2 mg/dL   GFR calc non Af Amer 22 (L) >60 mL/min   GFR calc Af Amer 26 (L) >60 mL/min    Comment: (NOTE) The eGFR has been calculated using the CKD EPI equation. This calculation has not been validated in all clinical situations. eGFR's persistently <60 mL/min signify possible Chronic Kidney Disease.    Anion gap 14 5 - 15    Comment: Performed at [facility] [address], [place], [place] [address]  CBC with Differential     Status: Abnormal   Collection Time: 09/03/17 12:10 PM  Result Value Ref Range   WBC 3.9 (L) 4.0 - 10.5 K/uL   RBC 4.76 3.87 - 5.11 MIL/uL    Hemoglobin 15.4 (H) 12.0 - 15.0 g/dL   HCT 47.5 (H) 36.0 - 46.0 %   MCV 99.8 78.0 - 100.0 fL   MCH 32.4 26.0 - 34.0 pg   MCHC 32.4 30.0 - 36.0 g/dL   RDW 14.4 11.5 - 15.5 %   Platelets 222 150 - 400 K/uL   Neutrophils Relative % 73 %   Neutro Abs 2.9 1.7 - 7.7 K/uL   Lymphocytes Relative 13 %   Lymphs Abs 0.5 (L) 0.7 - 4.0 K/uL   Monocytes Relative 12 %   Monocytes Absolute 0.5 0.1 - 1.0 K/uL   Eosinophils Relative 0 %   Eosinophils Absolute 0.0 0.0 - 0.7 K/uL   Basophils Relative 1 %   Basophils Absolute 0.0 0.0 - 0.1 K/uL   Immature Granulocytes 1 %   Abs Immature Granulocytes 0.0 0.0 - 0.1 K/uL    Comment: Performed at [facility], [address], [place], [place] [address]  Magnesium     Status: Abnormal   Collection Time: 09/03/17 12:10 PM  Result Value Ref Range   Magnesium 2.6 (H) 1.7 - 2.4 mg/dL    Comment: Performed at [facility] [address], [place], [place] [address]  I-Stat CG4 Lactic Acid, ED     Status: Abnormal   Collection Time: 09/03/17 12:23 PM  Result Value Ref Range   Lactic Acid, Venous 3.23 (HH) 0.5 - 1.9 mmol/L   Comment NOTIFIED PHYSICIAN   Ethanol     Status: None   Collection Time: 09/03/17  1:56 PM  Result Value Ref Range   Alcohol, Ethyl (B) <10 <10 mg/dL    Comment: (NOTE) Lowest detectable limit for serum alcohol is 10 mg/dL. For medical purposes only. Performed at [facility], [place] [address], [place], [place] [address]   Ammonia     Status: None   Collection Time: 09/03/17  1:56  PM  Result Value Ref Range   Ammonia 23 9 - 35 umol/L    Comment: Performed at [facility], [place] [address], [place], [place] [address]  I-Stat CG4 Lactic Acid, ED     Status: Abnormal   Collection Time: 09/03/17  2:09 PM  Result Value Ref Range   Lactic Acid, Venous 2.04 (HH) 0.5 - 1.9 mmol/L   Comment NOTIFIED PHYSICIAN   Urinalysis, Routine w reflex microscopic     Status: Abnormal   Collection Time: 09/03/17  3:57 PM  Result  Value Ref Range   Color, Urine AMBER (A) YELLOW    Comment: BIOCHEMICALS MAY BE AFFECTED BY COLOR   APPearance HAZY (A) CLEAR   Specific Gravity, Urine 1.017 1.005 - 1.030   pH 5.0 5.0 - 8.0   Glucose, UA NEGATIVE NEGATIVE mg/dL   Hgb urine dipstick NEGATIVE NEGATIVE   Bilirubin Urine NEGATIVE NEGATIVE   Ketones, ur NEGATIVE NEGATIVE mg/dL   Protein, ur 30 (A) NEGATIVE mg/dL   Nitrite NEGATIVE NEGATIVE   Leukocytes, UA NEGATIVE NEGATIVE   WBC, UA 0-5 0 - 5 WBC/hpf   Bacteria, UA NONE SEEN NONE SEEN   Squamous Epithelial / LPF 6-10 0 - 5   Hyaline Casts, UA PRESENT     Comment: Performed at [facility], [place] [address] [place], [place] [address]  Lipase, blood     Status: None   Collection Time: 09/03/17  4:31 PM  Result Value Ref Range   Lipase 26 11 - 51 U/L    Comment: Performed at [facility] [address] [place], [place] [address]  CK     Status: None   Collection Time: 09/03/17  4:31 PM  Result Value Ref Range   Total CK 63 38 - 234 U/L    Comment: Performed at [facility], [place] [address] [place], [place] [address]  Urine culture     Status: None   Collection Time: 09/03/17  4:33 PM  Result Value Ref Range   Specimen Description URINE, RANDOM    Special Requests NONE    Culture      NO GROWTH Performed at [facility], [place] [address] [place], [place] [address]    Report Status 09/04/2017 FINAL   Blood Culture (routine x 2)     Status: None (Preliminary result)   Collection Time: 09/03/17  4:38 PM  Result Value Ref Range   Specimen Description BLOOD LEFT ANTECUBITAL    Special Requests      BOTTLES DRAWN AEROBIC AND ANAEROBIC Blood Culture adequate volume   Culture      NO GROWTH < 24 HOURS Performed at [facility], [place] [address], [place], [place] [address]    Report Status PENDING   Blood Culture (routine x 2)     Status: None (Preliminary result)   Collection Time: 09/03/17  5:00 PM  Result Value Ref Range   Specimen  Description BLOOD RIGHT HAND    Special Requests      BOTTLES DRAWN AEROBIC ONLY Blood Culture results may not be optimal due to an inadequate volume of blood received in culture bottles   Culture      NO GROWTH < 24 HOURS Performed at [facility] [address], [place], [place] [address]    Report Status PENDING   I-Stat CG4 Lactic Acid, ED     Status: Abnormal   Collection Time: 09/03/17  5:07  PM  Result Value Ref Range   Lactic Acid, Venous 2.06 (HH) 0.5 - 1.9 mmol/L   Comment NOTIFIED PHYSICIAN   I-Stat venous blood gas, ED     Status: Abnormal   Collection Time: 09/03/17  5:13 PM  Result Value Ref Range   pH, Ven 7.303 7.250 - 7.430   pCO2, Ven 30.9 (L) 44.0 - 60.0 mmHg   pO2, Ven 124.0 (H) 32.0 - 45.0 mmHg   Bicarbonate 15.3 (L) 20.0 - 28.0 mmol/L   TCO2 16 (L) 22 - 32 mmol/L   O2 Saturation 98.0 %   Acid-base deficit 10.0 (H) 0.0 - 2.0 mmol/L   Patient temperature HIDE    Sample type VENOUS   Troponin I (q 6hr x 3)     Status: None   Collection Time: 09/03/17 10:10 PM  Result Value Ref Range   Troponin I <0.03 <0.03 ng/mL    Comment: Performed at [facility], [place] [address], [place], [place] [address]  TSH     Status: Abnormal   Collection Time: 09/03/17 10:10 PM  Result Value Ref Range   TSH 0.082 (L) 0.350 - 4.500 uIU/mL    Comment: Performed by a 3rd Generation assay with a functional sensitivity of <=0.01 uIU/mL. Performed at [facility], [place] [address] [place], [place] [address]   Culture, blood (routine x 2) Call MD if unable to obtain prior to antibiotics being given     Status: None (Preliminary result)   Collection Time: 09/03/17 10:10 PM  Result Value Ref Range   Specimen Description BLOOD RIGHT ANTECUBITAL    Special Requests      BOTTLES DRAWN AEROBIC AND ANAEROBIC Blood Culture adequate volume   Culture      NO GROWTH < 24 HOURS Performed at [facility] [address], [place], [place] [address]    Report Status  PENDING   HIV antibody (Routine Screening)     Status: None   Collection Time: 09/03/17 10:10 PM  Result Value Ref Range   HIV Screen 4th Generation wRfx Non Reactive Non Reactive    Comment: (NOTE) Performed At: [facility] [place], [place] [id number] Rush Farmer MD [id number] Performed at [facility], [place] [address], [place], [place] [address]   Creatinine, serum     Status: Abnormal   Collection Time: 09/03/17 10:10 PM  Result Value Ref Range   Creatinine, Ser 2.03 (H) 0.44 - 1.00 mg/dL   GFR calc non Af Amer 26 (L) >60 mL/min   GFR calc Af Amer 30 (L) >60 mL/min    Comment: (NOTE) The eGFR has been calculated using the CKD EPI equation. This calculation has not been validated in all clinical situations. eGFR's persistently <60 mL/min signify possible Chronic Kidney Disease. Performed at [facility], [place] [address] [place], [place] [address]   Ammonia     Status: None   Collection Time: 09/03/17 10:10 PM  Result Value Ref Range   Ammonia 13 9 - 35 umol/L    Comment: Performed at [facility], [place] [address], [place], [place] [address]  Gamma GT     Status: None   Collection Time: 09/03/17 10:10 PM  Result Value Ref Range   GGT 45 7 - 50 U/L    Comment: Performed at [facility], [place] [address] [place], [place] [address]  Cortisol     Status: None   Collection Time: 09/03/17  10:18 PM  Result Value Ref Range   Cortisol, Plasma 28.2 ug/dL    Comment: (NOTE) AM    6.7 - 22.6 ug/dL PM   <10.0       ug/dL Performed at [facility] [address], [place], [place] [address]   Sodium, urine, random     Status: None   Collection Time: 09/04/17  2:41 AM  Result Value Ref Range   Sodium, Ur 40 mmol/L    Comment: Performed at [facility] [address], [place], [place] [address]  Strep pneumoniae urinary antigen     Status: None   Collection Time: 09/04/17  2:41 AM  Result Value Ref Range   Strep  Pneumo Urinary Antigen NEGATIVE NEGATIVE    Comment:        Infection due to S. pneumoniae cannot be absolutely ruled out since the antigen present may be below the detection limit of the test.   Procalcitonin - Baseline     Status: None   Collection Time: 09/04/17  2:41 AM  Result Value Ref Range   Procalcitonin 12.10 ng/mL    Comment:        Interpretation: PCT >= 10 ng/mL: Important systemic inflammatory response, almost exclusively due to severe bacterial sepsis or septic shock. (NOTE)       Sepsis PCT Algorithm           Lower Respiratory Tract                                      Infection PCT Algorithm    ----------------------------     ----------------------------         PCT < 0.25 ng/mL                PCT < 0.10 ng/mL         Strongly encourage             Strongly discourage   discontinuation of antibiotics    initiation of antibiotics    ----------------------------     -----------------------------       PCT 0.25 - 0.50 ng/mL            PCT 0.10 - 0.25 ng/mL               OR       >80% decrease in PCT            Discourage initiation of                                            antibiotics      Encourage discontinuation           of antibiotics    ----------------------------     -----------------------------         PCT >= 0.50 ng/mL              PCT 0.26 - 0.50 ng/mL                AND       <80% decrease in PCT             Encourage initiation of  antibiotics       Encourage continuation           of antibiotics    ----------------------------     -----------------------------        PCT >= 0.50 ng/mL                  PCT > 0.50 ng/mL               AND         increase in PCT                  Strongly encourage                                      initiation of antibiotics    Strongly encourage escalation           of antibiotics                                     -----------------------------                                            PCT <= 0.25 ng/mL                                                 OR                                        > 80% decrease in PCT                                     Discontinue / Do not initiate                                             antibiotics Performed at [facility], [address], [place], [place] [address]   Urine rapid drug screen (hosp performed)     Status: Abnormal   Collection Time: 09/04/17  2:41 AM  Result Value Ref Range   Opiates POSITIVE (A) NONE DETECTED   Cocaine NONE DETECTED NONE DETECTED   Benzodiazepines NONE DETECTED NONE DETECTED   Amphetamines NONE DETECTED NONE DETECTED   Tetrahydrocannabinol NONE DETECTED NONE DETECTED   Barbiturates (A) NONE DETECTED    Result not available. Reagent lot number recalled by manufacturer.    Comment: Performed at [facility], [place] [address], [place], [place] [address]  CBC     Status: Abnormal   Collection Time: 09/04/17  2:41 AM  Result Value Ref Range   WBC 1.9 (L) 4.0 - 10.5 K/uL   RBC 3.52 (L) 3.87 - 5.11 MIL/uL   Hemoglobin 11.4 (L) 12.0 - 15.0 g/dL    Comment: REPEATED TO VERIFY DELTA CHECK NOTED    HCT  35.5 (L) 36.0 - 46.0 %   MCV 100.9 (H) 78.0 - 100.0 fL   MCH 32.4 26.0 - 34.0 pg   MCHC 32.1 30.0 - 36.0 g/dL   RDW 14.8 11.5 - 15.5 %   Platelets 179 150 - 400 K/uL    Comment: Performed at [facility] [address], [place], [place] [address]  Comprehensive metabolic panel     Status: Abnormal   Collection Time: 09/04/17  2:41 AM  Result Value Ref Range   Sodium 144 135 - 145 mmol/L   Potassium 5.7 (H) 3.5 - 5.1 mmol/L    Comment: SPECIMEN HEMOLYZED. HEMOLYSIS MAY AFFECT INTEGRITY OF RESULTS.   Chloride 119 (H) 101 - 111 mmol/L   CO2 16 (L) 22 - 32 mmol/L   Glucose, Bld 93 65 - 99 mg/dL   BUN 36 (H) 6 - 20 mg/dL   Creatinine, Ser 1.75 (H) 0.44 - 1.00 mg/dL   Calcium 6.7 (L) 8.9 - 10.3 mg/dL   Total Protein 4.3 (L) 6.5 - 8.1 g/dL   Albumin 2.0 (L)  3.5 - 5.0 g/dL   AST 53 (H) 15 - 41 U/L   ALT 23 14 - 54 U/L   Alkaline Phosphatase 231 (H) 38 - 126 U/L   Total Bilirubin 0.8 0.3 - 1.2 mg/dL   GFR calc non Af Amer 31 (L) >60 mL/min   GFR calc Af Amer 36 (L) >60 mL/min    Comment: (NOTE) The eGFR has been calculated using the CKD EPI equation. This calculation has not been validated in all clinical situations. eGFR's persistently <60 mL/min signify possible Chronic Kidney Disease.    Anion gap 9 5 - 15    Comment: Performed at [facility] [address] [place], [place] [address]  Glucose, capillary     Status: None   Collection Time: 09/04/17  2:56 AM  Result Value Ref Range   Glucose-Capillary 74 65 - 99 mg/dL   Comment 1 Notify RN   MRSA PCR Screening     Status: None   Collection Time: 09/04/17  3:05 AM  Result Value Ref Range   MRSA by PCR NEGATIVE NEGATIVE    Comment:        The GeneXpert MRSA Assay (FDA approved for NASAL specimens only), is one component of a comprehensive MRSA colonization surveillance program. It is not intended to diagnose MRSA infection nor to guide or monitor treatment for MRSA infections. Performed at [facility], [place] [address] [place], [place] [address]   Troponin I (q 6hr x 3)     Status: None   Collection Time: 09/04/17  3:19 AM  Result Value Ref Range   Troponin I <0.03 <0.03 ng/mL    Comment: Performed at [facility] [address] [place], [place] [address]  Magnesium     Status: None   Collection Time: 09/04/17  3:19 AM  Result Value Ref Range   Magnesium 1.7 1.7 - 2.4 mg/dL    Comment: Performed at [facility] [address], [place], [place] [address]  Procalcitonin     Status: None   Collection Time: 09/04/17  3:19 AM  Result Value Ref Range   Procalcitonin 13.61 ng/mL    Comment:        Interpretation: PCT >= 10 ng/mL: Important systemic inflammatory response, almost exclusively due to severe bacterial sepsis or septic  shock. (NOTE)       Sepsis PCT Algorithm  Lower Respiratory Tract                                      Infection PCT Algorithm    ----------------------------     ----------------------------         PCT < 0.25 ng/mL                PCT < 0.10 ng/mL         Strongly encourage             Strongly discourage   discontinuation of antibiotics    initiation of antibiotics    ----------------------------     -----------------------------       PCT 0.25 - 0.50 ng/mL            PCT 0.10 - 0.25 ng/mL               OR       >80% decrease in PCT            Discourage initiation of                                            antibiotics      Encourage discontinuation           of antibiotics    ----------------------------     -----------------------------         PCT >= 0.50 ng/mL              PCT 0.26 - 0.50 ng/mL                AND       <80% decrease in PCT             Encourage initiation of                                             antibiotics       Encourage continuation           of antibiotics    ----------------------------     -----------------------------        PCT >= 0.50 ng/mL                  PCT > 0.50 ng/mL               AND         increase in PCT                  Strongly encourage                                      initiation of antibiotics    Strongly encourage escalation           of antibiotics                                     -----------------------------  PCT <= 0.25 ng/mL                                                 OR                                        > 80% decrease in PCT                                     Discontinue / Do not initiate                                             antibiotics Performed at [facility], [place] [address] [place], [place] [address]   Cortisol     Status: None   Collection Time: 09/04/17  3:19 AM  Result Value Ref Range   Cortisol, Plasma 25.5 ug/dL    Comment:  (NOTE) AM    6.7 - 22.6 ug/dL PM   <10.0       ug/dL Performed at [facility] [address], [place], [place] [address]   Lactic acid, plasma     Status: Abnormal   Collection Time: 09/04/17  3:22 AM  Result Value Ref Range   Lactic Acid, Venous 2.3 (HH) 0.5 - 1.9 mmol/L    Comment: CRITICAL RESULT CALLED TO, READ BACK BY AND VERIFIED WITH: DONAHUE R,RN 09/04/17 0440 WAYK Performed at [facility], [place] [address] [place], [place] [address]   Lactic acid, plasma     Status: Abnormal   Collection Time: 09/04/17  6:39 AM  Result Value Ref Range   Lactic Acid, Venous 2.7 (HH) 0.5 - 1.9 mmol/L    Comment: CRITICAL RESULT CALLED TO, READ BACK BY AND VERIFIED WITH: Sherryll Burger [id number] [facility] Performed at [facility], [place] [address] [place], [place] [address]   Basic metabolic panel     Status: Abnormal   Collection Time: 09/04/17  6:39 AM  Result Value Ref Range   Sodium 146 (H) 135 - 145 mmol/L   Potassium 3.1 (L) 3.5 - 5.1 mmol/L    Comment: NO VISIBLE HEMOLYSIS   Chloride 117 (H) 101 - 111 mmol/L   CO2 19 (L) 22 - 32 mmol/L   Glucose, Bld 98 65 - 99 mg/dL   BUN 39 (H) 6 - 20 mg/dL   Creatinine, Ser 1.96 (H) 0.44 - 1.00 mg/dL   Calcium 7.4 (L) 8.9 - 10.3 mg/dL   GFR calc non Af Amer 27 (L) >60 mL/min   GFR calc Af Amer 31 (L) >60 mL/min    Comment: (NOTE) The eGFR has been calculated using the CKD EPI equation. This calculation has not been validated in all clinical situations. eGFR's persistently <60 mL/min signify possible Chronic Kidney Disease.    Anion gap 10 5 - 15    Comment: Performed at [facility] [address], [place],  [address]  Respiratory Panel by PCR     Status: None   Collection Time: 09/04/17  7:44 AM  Result Value Ref Range   Adenovirus NOT DETECTED NOT DETECTED   Coronavirus 229E NOT DETECTED NOT DETECTED   Coronavirus HKU1 NOT DETECTED NOT DETECTED   Coronavirus NL63 NOT DETECTED NOT DETECTED    Coronavirus OC43 NOT DETECTED NOT DETECTED   Metapneumovirus NOT DETECTED NOT DETECTED   Rhinovirus / Enterovirus NOT DETECTED NOT DETECTED   Influenza A NOT DETECTED NOT DETECTED   Influenza B NOT DETECTED NOT DETECTED   Parainfluenza Virus 1 NOT DETECTED NOT DETECTED   Parainfluenza Virus 2 NOT DETECTED NOT DETECTED   Parainfluenza Virus 3 NOT DETECTED NOT DETECTED   Parainfluenza Virus 4 NOT DETECTED NOT DETECTED   Respiratory Syncytial Virus NOT DETECTED NOT DETECTED   Bordetella pertussis NOT DETECTED NOT DETECTED   Chlamydophila pneumoniae NOT DETECTED NOT DETECTED   Mycoplasma pneumoniae NOT DETECTED NOT DETECTED    Comment: Performed at [facility], [place] [address], [place], [place] [address]  Glucose, capillary     Status: None   Collection Time: 09/04/17  7:54 AM  Result Value Ref Range   Glucose-Capillary 74 65 - 99 mg/dL   Comment 1 Notify RN   Lactic acid, plasma     Status: None   Collection Time: 09/04/17 11:19 AM  Result Value Ref Range   Lactic Acid, Venous 1.3 0.5 - 1.9 mmol/L    Comment: Performed at [facility] [address], [place], [place] [address]  Blood gas, arterial     Status: Abnormal   Collection Time: 09/04/17 11:24 AM  Result Value Ref Range   FIO2 44.00    Delivery systems NASAL CANNULA    pH, Arterial 7.352 7.350 - 7.450   pCO2 arterial 26.9 (L) 32.0 - 48.0 mmHg   pO2, Arterial 66.0 (L) 83.0 - 108.0 mmHg   Bicarbonate 14.5 (L) 20.0 - 28.0 mmol/L   Acid-base deficit 9.9 (H) 0.0 - 2.0 mmol/L   O2 Saturation 92.2 %   Patient temperature 99.4    Collection site RIGHT BRACHIAL    Drawn by [phone]    Sample type ARTERIAL DRAW   Glucose, capillary     Status: None   Collection Time: 09/04/17 11:43 AM  Result Value Ref Range   Glucose-Capillary 77 65 - 99 mg/dL   Comment 1 Notify RN    Ct Abdomen Pelvis Wo Contrast  Result Date: 09/03/2017 CLINICAL DATA:  Generalized abdominal pain, diarrhea. EXAM: CT ABDOMEN AND PELVIS WITHOUT  CONTRAST TECHNIQUE: Multidetector CT imaging of the abdomen and pelvis was performed following the standard protocol without IV contrast. COMPARISON:  CT scan of February 20, 2017. FINDINGS: Lower chest: Mild bilateral posterior basilar subsegmental atelectasis is noted. Hepatobiliary: No focal liver abnormality is seen. No gallstones, gallbladder wall thickening, or biliary dilatation. Pancreas: Unremarkable. No pancreatic ductal dilatation or surrounding inflammatory changes. Spleen: Normal in size without focal abnormality. Adrenals/Urinary Tract: Adrenal glands are unremarkable. Bilateral nonobstructive nephrolithiasis is noted. No hydronephrosis or renal obstruction is noted. No ureteral calculi are noted. Urinary bladder is decompressed. Stomach/Bowel: The stomach appears normal. The appendix appears normal. Stool is noted throughout the colon. Wall thickening of descending and proximal sigmoid colon is noted consistent with infectious or inflammatory colitis. Vascular/Lymphatic: Aortic atherosclerosis. No enlarged abdominal or pelvic lymph nodes. Reproductive: Uterus and bilateral adnexa are unremarkable. Other: Small amount of free fluid is noted in the pelvis. No hernia is noted. Musculoskeletal: No acute or significant osseous findings. IMPRESSION: Wall thickening of descending and proximal sigmoid colon is noted  consistent with infectious or inflammatory colitis. Bilateral nonobstructive nephrolithiasis. Mild bilateral posterior basilar subsegmental atelectasis. Aortic Atherosclerosis (ICD10-I70.0). Electronically Signed   By: Marijo Conception, M.D.   On: 09/03/2017 18:25   Ct Head Wo Contrast  Result Date: 09/04/2017 CLINICAL DATA:  61 year old female with altered mental status. EXAM: CT HEAD WITHOUT CONTRAST TECHNIQUE: Contiguous axial images were obtained from the base of the skull through the vertex without intravenous contrast. COMPARISON:  Head CT dated 02/20/2017 FINDINGS: Brain: The ventricles  and sulci appropriate size for patient's age. Small old right thalamic infarct again noted. There is no acute intracranial hemorrhage. No mass effect or midline shift. No extra-axial fluid collection. Vascular: No hyperdense vessel or unexpected calcification. Skull: Normal. Negative for fracture or focal lesion. Sinuses/Orbits: No acute finding. Other: None IMPRESSION: 1. No acute intracranial pathology. 2. 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 Electronically Signed   By: Anner Crete M.D.   On: 09/04/2017 02:20   Dg Chest Port 1 View  Result Date: 09/04/2017 CLINICAL DATA:  Sepsis. EXAM: PORTABLE CHEST 1 VIEW COMPARISON:  Chest x-ray from yesterday. FINDINGS: The heart size and mediastinal contours are within normal limits. Normal pulmonary vascularity. Low lung volumes with new patchy consolidation in the left upper and lower lobes, as well as the right mid and infrahilar lung. No pleural effusion or pneumothorax. No acute osseous abnormality. IMPRESSION: 1. New left-greater-than-right airspace disease, concerning for multifocal pneumonia or ARDS. Electronically Signed   By: Titus Dubin M.D.   On: 09/04/2017 11:22   Dg Chest Portable 1 View  Result Date: 09/03/2017 CLINICAL DATA:  Fever. EXAM: PORTABLE CHEST 1 VIEW COMPARISON:  Radiographs of May 24, 2017. FINDINGS: The heart size and mediastinal contours are within normal limits. No pneumothorax or pleural effusion is noted. Right lung is clear. Minimal left basilar atelectasis or infiltrate is noted. The visualized skeletal structures are unremarkable. IMPRESSION: Minimal left basilar atelectasis or infiltrate is noted. Electronically Signed   By: Marijo Conception, M.D.   On: 09/03/2017 14:11   Dg Abd Portable 1v  Result Date: 09/04/2017 CLINICAL DATA:  Severe abdominal pain. Abdominal distention. Recent diagnosis of colitis. EXAM: PORTABLE ABDOMEN - 1 VIEW COMPARISON:  CT abdomen and pelvis 09/03/2017. FINDINGS: Mildly dilated loops of  small bowel are present throughout the abdomen. There is gas within the colon. Thickened colonic wall is again noted. Obstruction or free air is present. Right greater than left lower lobe airspace disease is present. IMPRESSION: 1. Mildly dilated loops of small bowel throughout the abdomen with gas in the colon. Findings are compatible with adynamic ileus. 2. Thickening of the colonic wall compatible with colitis. 3. Bibasilar airspace disease compatible with multi lobar pneumonia versus ARDS. Electronically Signed   By: San Morelle M.D.   On: 09/04/2017 13:10   Dg Abd Portable 1 View  Result Date: 09/03/2017 CLINICAL DATA:  Acute generalized abdominal pain, diarrhea. EXAM: PORTABLE ABDOMEN - 1 VIEW COMPARISON:  Radiograph of Jul 25, 2017. FINDINGS: The bowel gas pattern is normal. No radio-opaque calculi or other significant radiographic abnormality are seen. IMPRESSION: No evidence of bowel obstruction or ileus. Electronically Signed   By: Marijo Conception, M.D.   On: 09/03/2017 14:09      Assessment/Plan Hx of CVA HTN Depression/anxiety AKI - Cr 1.96 this AM, continue rehydration with IVF Acute metabolic encephalopathy Acute hypoxic respiratory failure - CXR with concern for multifocal pneumonia vs ARDS, on high flow nasal cannula, high risk for intubation  EtOH abuse - CIWA protocol Tobacco abuse  Severe sepsis secondary to colitis - concerned this could be ischemic in nature given history and exam, but lactic acid clearing and patient is hemodynamically stable  - CT 6/23: wall thickening of descending and proximal sigmoid colon - KUB 6/24: dilated loops small bowel w/ gas in the colon consistent with adynamic ileus, colonic wall thickening - leukopenic, afebrile, tachycardic, BP stable - recommend serial abdominal exams for now with repeat KUB in the AM, NPO - if kidney function improves consider CTA abdomen/pelvis to r/o embolic or thrombotic cause - if patient acutely worsens  or becomes hemodynamically unstable may require operative intervention Hypokalemia - K 3.1 this AM, goal of 4.0 in the setting of ileus  FEN: NPO, IVF VTE: SCDs, SQ heparin  ID: zithromax 6/23>>, flagyl 6/23>>, rocephin 6/24>>  Brigid Re, [facility] Surgery 09/04/2017, 3:51 PM Pager: [phone] Consults: [phone] Mon-Fri 7:00 am-4:30 pm Sat-Sun 7:00 am-11:30 am

## 2017-09-04 NOTE — Progress Notes (Addendum)
.. ..  Name: Carla Bowen MRN: [id number] DOB: 12/17/1955    ADMISSION DATE:  09/03/2017 CONSULTATION DATE:  09/04/2017  REFERRING MD :  Jani Gravel MD  CHIEF COMPLAINT:  Abdominal pain. S/p fall. Hypotension  BRIEF PATIENT DESCRIPTION:  62 yr old female with PMHx Anxiety, Depression, Fibromyalgia, High cholesterol, Hypertension, and CVA p/w abdominal pain, febrile and hypotensive. Received 3.5L IVF and remains hypotensive PCCM consulted for admission  SUBJECTIVE:  LA rising 2.3 -> 2.7 Increasing O2 needs this am, now on 6L [place] and diffuse rhonchi/ crackles Minimal UOP  VITAL SIGNS: Temp:  [97.4 F (36.3 C)-103 F (39.4 C)] 99.4 F (37.4 C) (06/24 0700) Pulse Rate:  [71-119] 116 (06/24 0900) Resp:  [13-34] 27 (06/24 0900) BP: (79-152)/(44-108) 105/44 (06/24 0900) SpO2:  [86 %-100 %] 90 % (06/24 0900) FiO2 (%):  [0 %] 0 % (06/23 2100) Weight:  [113 lb (51.3 kg)-124 lb 1.9 oz (56.3 kg)] 124 lb 1.9 oz (56.3 kg) (06/24 0332)  PHYSICAL EXAMINATION: General:  Acutely ill adult female lying in bed in mild distress HEENT: MM pink/dry, pupils 4/reactive Neuro: Awake, oriented to self otherwise confused, MAE CV: ST, +1 pulses PULM: even/non-labored, tacypneic, diffuse rhonchi/ rales, strong cough, on 6L [place] at 88% GI: distended- not firm, diffusely tender > in lower/ RLQ, very hypo BS, + guarding Extremities: warm/dry, no edema  Skin: no rashes   Recent Labs  Lab 09/03/17 1210 09/03/17 2210 09/04/17 0241 09/04/17 0639  NA 139  --  144 146*  K 2.8*  --  5.7* 3.1*  CL 103  --  119* 117*  CO2 22  --  16* 19*  BUN 35*  --  36* 39*  CREATININE 2.30* 2.03* 1.75* 1.96*  GLUCOSE 133*  --  93 98   Recent Labs  Lab 09/03/17 1210 09/04/17 0241  HGB 15.4* 11.4*  HCT 47.5* 35.5*  WBC 3.9* 1.9*  PLT 222 179   Ct Abdomen Pelvis Wo Contrast  Result Date: 09/03/2017 CLINICAL DATA:  Generalized abdominal pain, diarrhea. EXAM: CT ABDOMEN AND PELVIS WITHOUT CONTRAST TECHNIQUE:  Multidetector CT imaging of the abdomen and pelvis was performed following the standard protocol without IV contrast. COMPARISON:  CT scan of February 20, 2017. FINDINGS: Lower chest: Mild bilateral posterior basilar subsegmental atelectasis is noted. Hepatobiliary: No focal liver abnormality is seen. No gallstones, gallbladder wall thickening, or biliary dilatation. Pancreas: Unremarkable. No pancreatic ductal dilatation or surrounding inflammatory changes. Spleen: Normal in size without focal abnormality. Adrenals/Urinary Tract: Adrenal glands are unremarkable. Bilateral nonobstructive nephrolithiasis is noted. No hydronephrosis or renal obstruction is noted. No ureteral calculi are noted. Urinary bladder is decompressed. Stomach/Bowel: The stomach appears normal. The appendix appears normal. Stool is noted throughout the colon. Wall thickening of descending and proximal sigmoid colon is noted consistent with infectious or inflammatory colitis. Vascular/Lymphatic: Aortic atherosclerosis. No enlarged abdominal or pelvic lymph nodes. Reproductive: Uterus and bilateral adnexa are unremarkable. Other: Small amount of free fluid is noted in the pelvis. No hernia is noted. Musculoskeletal: No acute or significant osseous findings. IMPRESSION: Wall thickening of descending and proximal sigmoid colon is noted consistent with infectious or inflammatory colitis. Bilateral nonobstructive nephrolithiasis. Mild bilateral posterior basilar subsegmental atelectasis. Aortic Atherosclerosis (ICD10-I70.0). Electronically Signed   By: Marijo Conception, M.D.   On: 09/03/2017 18:25   Ct Head Wo Contrast  Result Date: 09/04/2017 CLINICAL DATA:  62 year old female with altered mental status. EXAM: CT HEAD WITHOUT CONTRAST TECHNIQUE: Contiguous axial images were obtained from the  base of the skull through the vertex without intravenous contrast. COMPARISON:  Head CT dated 02/20/2017 FINDINGS: Brain: The ventricles and sulci  appropriate size for patient's age. Small old right thalamic infarct again noted. There is no acute intracranial hemorrhage. No mass effect or midline shift. No extra-axial fluid collection. Vascular: No hyperdense vessel or unexpected calcification. Skull: Normal. Negative for fracture or focal lesion. Sinuses/Orbits: No acute finding. Other: None IMPRESSION: 1. No acute intracranial pathology. 2. 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 Electronically Signed   By: Anner Crete M.D.   On: 09/04/2017 02:20   Dg Chest Portable 1 View  Result Date: 09/03/2017 CLINICAL DATA:  Fever. EXAM: PORTABLE CHEST 1 VIEW COMPARISON:  Radiographs of May 24, 2017. FINDINGS: The heart size and mediastinal contours are within normal limits. No pneumothorax or pleural effusion is noted. Right lung is clear. Minimal left basilar atelectasis or infiltrate is noted. The visualized skeletal structures are unremarkable. IMPRESSION: Minimal left basilar atelectasis or infiltrate is noted. Electronically Signed   By: Marijo Conception, M.D.   On: 09/03/2017 14:11   Dg Abd Portable 1 View  Result Date: 09/03/2017 CLINICAL DATA:  Acute generalized abdominal pain, diarrhea. EXAM: PORTABLE ABDOMEN - 1 VIEW COMPARISON:  Radiograph of Jul 25, 2017. FINDINGS: The bowel gas pattern is normal. No radio-opaque calculi or other significant radiographic abnormality are seen. IMPRESSION: No evidence of bowel obstruction or ileus. Electronically Signed   By: Marijo Conception, M.D.   On: 09/03/2017 14:09   SIGNIFICANT EVENTS  Hypotension despite 3.5 L IVF  STUDIES:  CT Ab/Pelv: Wall thickening of descending and proximal sigmoid colon is noted consistent with infectious or inflammatory colitis. Bilateral nonobstructive nephrolithiasis. Mild bilateral posterior basilar subsegmental atelectasis.  Cultures:  6/23 BC x 2 >> 6/23 GI panel PCR >> 6/24 MRSA PCR >> neg 6/24 Resp panel >> 6/24 UC >> 6/24 urine strep >> neg 6/24 urine  legionella >>  Antibiotics:  6/23 azithromycin >> 6/23 ceftriaxone >> 6/23 Flagyl >>  ASSESSMENT / PLAN: NEURO: Acute metabolic encephalopathy h/o ETOH use- last ETOH use was 6/23 AM, ETOH level on admit  <10 P:  High risk for withdrawal Continue on CIWA , d/c ativan with increasing AMS, precedex if needed Thiamine and Folate   CARDIAC SIRS borderline shock  P:  Tele monitoring Goal MAP > 65, levophed as needed  Cortisol 25.5 Trend lactate pending ECHO  Troponin neg  PULMONARY: Acute hypoxic respiratory failure  Left basilar atelectasis or infiltrate P:  High risk for intubation ABG and CXR now CXR in am Continue supplemental O2 for goal > 92%, remains on [place] HOB> 30 degrees See ID   ID: Severe Sepsis secondary to Colitis LLL PNA P:  F/u blood cx Continue Flagyl, azithro/ ceftriaxone for now, pending legionella/ followup cxr trend PCT Trend WBC, fever curve Awaiting Cdiff PCR, GI panel PCR, RVP  CXR now and in am Urine leg pending, urine strep neg  Endocrine: TSH 0.082 P:  CBG q 4 while NPO  GI: Infectious and Inflammatory Colitis  Alk phos 446, GGT normal - unlikely biliary source P:  -continue on Flagyl IV Keep NPO for now  Trend LFTs  Heme: Leukopenia  Thrombocytopenia - ?r/t ETOH abuse vs sepsis P:  No signs of bleeding  Trend CBC DVT PPx-> hep [place] and SCDs  RENAL Acute Kidney Injury Anion Gap metabolic / lactic/ hyperchloremic acidosis Mild hypernatremia Hypokalemia/ hypomag P:  Change to from NS to LR at 100 Mag  2gm now, repeat BMET at [place] /phos/ mag/ urinary output Replace electrolytes as indicated  DISPOSITION: ICU CC TIME: 55 mins PROGNOSIS: Guarded FAMILY: no family present on rounds CODE STATUS: Full  Kennieth Rad, AGACNP-BC [facility] Pgr: [phone] or if no answer [phone] 09/04/2017, 11:13 AM

## 2017-09-04 NOTE — Consult Note (Signed)
.. ..  Name: Carla Bowen MRN: [id number] DOB: 08-Apr-1955    ADMISSION DATE:  09/03/2017 CONSULTATION DATE:  09/04/2017  REFERRING MD :  Jani Gravel MD  CHIEF COMPLAINT:  Abdominal pain. S/p fall. Hypotension  BRIEF PATIENT DESCRIPTION:  62 yr old female with PMHx Anxiety, Depression, Fibromyalgia, High cholesterol, Hypertension, and CVA p/w abdominal pain, febrile and hypotensive. Received 3.5L IVF and remains hypotensive PCCM consulted for admission  SIGNIFICANT EVENTS  Hypotension despite 3.5 L IVF  STUDIES:  CT Ab/Pelv: Wall thickening of descending and proximal sigmoid colon is noted consistent with infectious or inflammatory colitis. Bilateral nonobstructive nephrolithiasis. Mild bilateral posterior basilar subsegmental atelectasis.   HISTORY OF PRESENT ILLNESS:   Pt is a 62 yr old female with PMHx of Anxiety, Depression, Fibromyalgia, High cholesterol, Hypertension, and CVA on my evaluation shew is sleeping comfortably,  MAPs ranging from 55-65  Covered in a cooling blanket not in any distress.  Upon waking the patient she is alert to her name and answers questions she is not oriented to place or time but is to person.  She states that this morning she had three shots of whisky and was with her best friend. She states that she tripped and hit her head but this is not the reason she came into [facility].   Her chief complaint to the EDP was abdominal pain and diarrhea which began last night. When I questioned the patient she complains of abdominal pain but states that she has been constipated for a long time and she finally had a BM this morning.  She does admit to drinking heavily and her last ETOH use was 6/23 AM. She denies nausea and vomiting until arriving to [facility].  PAST MEDICAL HISTORY :   has a past medical history of Anxiety, Depression, Fibromyalgia, High cholesterol, Hypertension, and Stroke ([place]).  has a past surgical history that includes Cesarean section. Prior to  Admission medications   Medication Sig Start Date End Date Taking? Authorizing Provider  ARIPiprazole (ABILIFY) 5 MG tablet Take 5 mg by mouth daily. 03/21/17  Yes [provider]  aspirin EC 81 MG tablet Take 81 mg by mouth daily.   Yes [provider]  buPROPion (WELLBUTRIN SR) 100 MG 12 hr tablet Take 100 mg by mouth every morning. 08/11/17  Yes [provider]  cyclobenzaprine (FLEXERIL) 10 MG tablet Take 10 mg by mouth at bedtime as needed for muscle spasms.  05/30/17  Yes [provider]  DULoxetine (CYMBALTA) 30 MG capsule Take 30 mg by mouth daily. 06/06/17  Yes [provider]  fluconazole (DIFLUCAN) 100 MG tablet Take 100 mg by mouth daily. 08/31/17  Yes [provider]  fluticasone (FLONASE) 50 MCG/ACT nasal spray Place 2 sprays into both nostrils 2 (two) times daily. 02/01/17  Yes [provider]  gabapentin (NEURONTIN) 100 MG capsule Take 100 mg by mouth daily. 05/10/17  Yes [provider]  HYDROcodone-acetaminophen (NORCO/VICODIN) 5-325 MG tablet Take 1 tablet by mouth 4 (four) times daily as needed for pain. 05/30/17  Yes [provider]  lidocaine (XYLOCAINE) 5 % ointment Apply 1 application topically 4 (four) times daily as needed for rash. 08/30/17  Yes [provider]  lisinopril (PRINIVIL,ZESTRIL) 40 MG tablet Take 40 mg by mouth daily. 08/26/17  Yes [provider]  montelukast (SINGULAIR) 10 MG tablet Take 10 mg by mouth at bedtime. 08/31/17  Yes [provider]  omeprazole (PRILOSEC) 40 MG capsule Take 40 mg by mouth daily.  06/05/17  Yes [provider]  rosuvastatin (CRESTOR) 10 MG tablet Take 10 mg by mouth at bedtime.    Yes [provider]  valACYclovir (VALTREX) 500 MG tablet Take 500 mg by mouth 2 (two) times daily.  08/31/17  Yes [provider]  amLODipine (NORVASC) 10 MG tablet Take 1 tablet (10 mg total) by mouth daily. Patient not taking:  Reported on 09/03/2017 06/25/13   Garvin Fila, MD  chlorthalidone (HYGROTON) 25 MG tablet Take 25 mg by mouth at bedtime.    [provider]  lamoTRIgine (LAMICTAL) 25 MG tablet Take 25 mg by mouth 2 (two) times daily. 04/16/17   [provider]  lansoprazole (PREVACID) 30 MG capsule Take 1 capsule (30 mg total) by mouth daily at 12 noon. Patient not taking: Reported on 02/20/2017 08/08/13   Davonna Belling, MD  lisinopril (PRINIVIL,ZESTRIL) 20 MG tablet Take 1 tablet (20 mg total) by mouth daily. Patient not taking: Reported on 09/03/2017 09/23/13   Garvin Fila, MD  potassium chloride SA (K-DUR,KLOR-CON) 20 MEQ tablet Take 1 tablet (20 mEq total) by mouth 2 (two) times daily for 5 days. Patient not taking: Reported on 06/14/2017 05/24/17 05/29/17  Long, Wonda Olds, MD   Allergies  Allergen Reactions  . Ciprofloxacin Itching, Nausea And Vomiting and Rash  . Codeine Hives  . Sulfa Antibiotics Hives  . Tramadol Hives    FAMILY HISTORY:  family history includes Diabetes in her brother; Heart attack in her brother; Hypertension in her brother and sister; Lung cancer in her father. SOCIAL HISTORY:  reports that she has been smoking.  She has a 10.00 pack-year smoking history. She has never used smokeless tobacco. She reports that she drinks about 6.0 oz of alcohol per week. She reports that she does not use drugs.  REVIEW OF SYSTEMS:  ( pertinent positives are bolded) Constitutional: Negative for fever, chills, weight loss, malaise/fatigue and diaphoresis.  HENT: Negative for hearing loss, ear pain, nosebleeds, congestion, sore throat, neck pain, tinnitus and ear discharge.   Eyes: Negative for blurred vision, double vision, photophobia, pain, discharge and redness.  Respiratory: Negative for cough, hemoptysis, sputum production, shortness of breath, wheezing and stridor.   Cardiovascular: Negative for chest pain, palpitations, orthopnea, claudication, leg swelling and PND.    Gastrointestinal: Negative for heartburn, nausea, vomiting, abdominal pain, diarrhea, constipation,  Denies blood in stool and melena.  Genitourinary: Negative for dysuria, urgency, frequency, hematuria and flank pain.  Musculoskeletal: Negative for myalgias, back pain, joint pain and falls.  Skin: Negative for itching and rash.  Neurological: Negative for dizziness, tingling, tremors, sensory change, speech change, focal weakness, seizures, loss of consciousness, weakness and headaches.  Endo/Heme/Allergies: Negative for environmental allergies and polydipsia. Does not bruise/bleed easily.  SUBJECTIVE:   VITAL SIGNS: Temp:  [97.7 F (36.5 C)-103 F (39.4 C)] 102.1 F (38.9 C) (06/23 2254) Pulse Rate:  [71-119] 101 (06/23 2300) Resp:  [15-26] 17 (06/23 2300) BP: (81-127)/(52-108) 87/52 (06/23 2300) SpO2:  [91 %-100 %] 96 % (06/23 2300) FiO2 (%):  [0 %] 0 % (06/23 2100) Weight:  [51.3 kg (113 lb)] 51.3 kg (113 lb) (06/23 1151)  PHYSICAL EXAMINATION: General:  Sleeping in no acute distress Neuro:  Alert to name oriented to person only no appreciable focal deficit, follows commands, words slurred.  HEENT:  NCAT no evidence of hematoma or laceration, nasal cannula in place Cardiovascular:  S1 and S2 appreciated no rubs, murmurs or gallop Lungs:  No wheezing no rhonci good  air entry bilaterally Abdomen:  Soft, distended diffusely tender on exam + BS Musculoskeletal:  No gross deformities Skin:  Grossly intact   Recent Labs  Lab 09/03/17 1210 09/03/17 2210  NA 139  --   K 2.8*  --   CL 103  --   CO2 22  --   BUN 35*  --   CREATININE 2.30* 2.03*  GLUCOSE 133*  --    Recent Labs  Lab 09/03/17 1210  HGB 15.4*  HCT 47.5*  WBC 3.9*  PLT 222   Ct Abdomen Pelvis Wo Contrast  Result Date: 09/03/2017 CLINICAL DATA:  Generalized abdominal pain, diarrhea. EXAM: CT ABDOMEN AND PELVIS WITHOUT CONTRAST TECHNIQUE: Multidetector CT imaging of the abdomen and pelvis was performed  following the standard protocol without IV contrast. COMPARISON:  CT scan of February 20, 2017. FINDINGS: Lower chest: Mild bilateral posterior basilar subsegmental atelectasis is noted. Hepatobiliary: No focal liver abnormality is seen. No gallstones, gallbladder wall thickening, or biliary dilatation. Pancreas: Unremarkable. No pancreatic ductal dilatation or surrounding inflammatory changes. Spleen: Normal in size without focal abnormality. Adrenals/Urinary Tract: Adrenal glands are unremarkable. Bilateral nonobstructive nephrolithiasis is noted. No hydronephrosis or renal obstruction is noted. No ureteral calculi are noted. Urinary bladder is decompressed. Stomach/Bowel: The stomach appears normal. The appendix appears normal. Stool is noted throughout the colon. Wall thickening of descending and proximal sigmoid colon is noted consistent with infectious or inflammatory colitis. Vascular/Lymphatic: Aortic atherosclerosis. No enlarged abdominal or pelvic lymph nodes. Reproductive: Uterus and bilateral adnexa are unremarkable. Other: Small amount of free fluid is noted in the pelvis. No hernia is noted. Musculoskeletal: No acute or significant osseous findings. IMPRESSION: Wall thickening of descending and proximal sigmoid colon is noted consistent with infectious or inflammatory colitis. Bilateral nonobstructive nephrolithiasis. Mild bilateral posterior basilar subsegmental atelectasis. Aortic Atherosclerosis (ICD10-I70.0). Electronically Signed   By: Marijo Conception, M.D.   On: 09/03/2017 18:25   Dg Chest Portable 1 View  Result Date: 09/03/2017 CLINICAL DATA:  Fever. EXAM: PORTABLE CHEST 1 VIEW COMPARISON:  Radiographs of May 24, 2017. FINDINGS: The heart size and mediastinal contours are within normal limits. No pneumothorax or pleural effusion is noted. Right lung is clear. Minimal left basilar atelectasis or infiltrate is noted. The visualized skeletal structures are unremarkable. IMPRESSION: Minimal  left basilar atelectasis or infiltrate is noted. Electronically Signed   By: Marijo Conception, M.D.   On: 09/03/2017 14:11   Dg Abd Portable 1 View  Result Date: 09/03/2017 CLINICAL DATA:  Acute generalized abdominal pain, diarrhea. EXAM: PORTABLE ABDOMEN - 1 VIEW COMPARISON:  Radiograph of Jul 25, 2017. FINDINGS: The bowel gas pattern is normal. No radio-opaque calculi or other significant radiographic abnormality are seen. IMPRESSION: No evidence of bowel obstruction or ileus. Electronically Signed   By: Marijo Conception, M.D.   On: 09/03/2017 14:09    ASSESSMENT / PLAN: NEURO: Acute metabolic encephalopathy h/o ETOH use- ETOH level as of 1356 on 6/23 is <10 High risk for withdrawal Continue on CIWA Thiamine and Folate  Other reasons for AMS examined: Ammonia levels 13 GCS 15 Not requiring sedation  Analgesia prn for abdominal pain  CARDIAC: Septic Shock LA 3.23-> 2.06 MAP goal >21mHg If MAPs drop start on [facility]opressor [facility] Recent Labs    09/03/17 1631 09/03/17 2210  CKTOTAL 63  --   TROPONINI  --  <0.03  will need ECHO to assess LVEF Trend Trops and Lactate   PULMONARY: Acute  hypoxic respiratory failure  On nasal cannula Reviewed CXR->Minimal left basilar atelectasis or infiltrate is noted HOB> 30 degrees Continue on supp O2 for Sat goal >92%  ID: Severe Sepsis secondary to Colitis F/u blood cx Continue Flagyl F/u PCT Trend WBC, Lactate and fever curve  Endocrine: TSH 0.082 No H/o insulin dependence F/u Cortiosl level  GI: Infectious and Inflammatory Colitis  -continue on Flagyl IV Keep NPO for now   Alk phos 446, GGT normal - unlikely biliary source Corrected Calcium 9.7  Given ETOH h/o- risk for cirrhosis  However Lipase normal, coags normal No evidence of abnormal liver architecture on CT scan   Heme: No active issues at this time ..hemoglobin 15.4 .Marland Kitchenplatelets 222 No signs of active bleeding at time of  evaluation Hgb<7 transfuse PRBCs No h/o coagulopathy DVT PPx-> hep [place] and SCDs  RENAL Baseline Cr Acute Kidney Injury Place an indwelling foley catheter Monitor UOP Lab Results  Component Value Date   CREATININE 2.03 (H) 09/03/2017   CREATININE 2.30 (H) 09/03/2017   CREATININE 0.86 06/13/2017  Hypokalemic 2.8 on previous CMP- will replete ( pt only rec'd 66mq potassium) Sodium 139 wnl AG 14- Anion Gap metabolic acidosis - secondary to lactic most likely     I, Dr KSeward Carolhave personally reviewed patient's available data, including medical history, events of note, physical examination and test results as part of my evaluation. I have discussed with NP EDewaine Oatsand other care providers such as pharmacist, RN and RRT. The patient is critically ill with multiple organ systems failure and requires high complexity decision making for assessment and support, frequent evaluation and titration of therapies, application of advanced monitoring technologies and extensive interpretation of multiple databases. Critical Care Time devoted to patient care services described in this note is 536Minutes. This time reflects time of care of this signee Dr KSeward Carol This critical care time does not reflect procedure time, or teaching time or supervisory time of but could involve care discussion time    DISPOSITION: ICU CC TIME: 55 mins PROGNOSIS: Guarded FAMILY:not at bedside at time of evaluation CODE STATUS: Full   Signed Dr KSeward CarolPulmonary Critical Care Locums  09/04/2017, 12:04 AM

## 2017-09-04 NOTE — Consult Note (Addendum)
Carla Bowen: 3:53 PM 09/04/2017  LOS: 1 day    Referring Provider: Dr Carlota Raspberry, CCM  Primary Care Physician:  Dois Davenport, MD Primary Gastroenterologist:  unassigned     Reason for Consultation:  Colitis.     HPI: Carla Bowen is a 62 y.o. female.  PMH CVA 2014.   Depression, anxiety.  Fibromyalgia, chronic pain.  On opiates.  Alcoholism. Patient says she has had a remote colonoscopy, there is nothing in the epic record Patient had a CT scan in December 2018 due to diarrhea and left-sided abdominal pain, contrasted study was unremarkable. 3 or 4 weeks ago she developed nonfocal abdominal pain and nausea.  PMD prescribed omeprazole, she is been taking 20 mg twice daily.  This helped with belching but did not relieve the nausea or abdominal pain.  Before the those symptoms patient had occasional heartburn and nausea but no dysphagia.  Good appetite, stable weight.  Over the weekend the pain progressed.  Yesterday she was found in and out of consciousness, lying in a pool of brown, loose stool.  There was no blood in this.  A week or so ago she was using laxatives because she was constipated.  She took abx for a UTI within the last month. She also has gait disturbance, loses her balance and sometimes falls.  She was brought to the ED and admitted.  She was confused.  Hypotensive with readings as low as 80s/60s.  Tachycardia in the low 100s -1 teens.  Oxygen saturations as low as 86%, up to 95%.  Temperature up to 103 F. Started on azithromycin, Rocephin, metronidazole.   Lactic acid 2.0 >> 2.7 >> 1.3.   WBCs 3.9 >> 1.9.  Hgb 15.4 >> 11.4.  MCV 100. AKI.  Initially hypokalemic at 2.8, then 5.7, now 3.1 T bili 0.9 >>0.8.  Alkaline phosphatase 446 >> 231.  AST/ALT 93/40 >> 53/23.  Ammonia 23. Stool  today was soft, because it was not diarrheal, it was declined for C. difficile testing.  Stool pathogen panel is processing.  Plain abdominal films unremarkable.  Chest x-ray with left base atelectasis versus infiltrate.  Today chest x-ray is raising concern for multi focal pneumonia versus ARDS.   Noncontrast CT abdomen pelvis with thickening of the bowel wall in the descending and proximal colon.  Nonobstructive nephrolithiasis.  Aortic atherosclerosis.  Mild, bilateral basilar atelectasis.  Patient admits to drinking whiskey.  She says that 1/5 of whiskey last 5 to 6 days but family says she probably uses more than that.  Family history.  Her son has a history of bleeding ulcers.   Past Medical History:  Diagnosis Date  . Anxiety   . Depression   . Fibromyalgia   . High cholesterol   . Hypertension   . Stroke [facility])     Past Surgical History:  Procedure Laterality Date  . CESAREAN SECTION      Prior to Admission medications   Medication Sig Start Date End Date Taking? Authorizing Provider  ARIPiprazole (ABILIFY) 5 MG tablet Take 5 mg  by mouth daily. 03/21/17  Yes [provider]  aspirin EC 81 MG tablet Take 81 mg by mouth daily.   Yes [provider]  buPROPion (WELLBUTRIN SR) 100 MG 12 hr tablet Take 100 mg by mouth every morning. 08/11/17  Yes [provider]  cyclobenzaprine (FLEXERIL) 10 MG tablet Take 10 mg by mouth at bedtime as needed for muscle spasms.  05/30/17  Yes [provider]  DULoxetine (CYMBALTA) 30 MG capsule Take 30 mg by mouth daily. 06/06/17  Yes [provider]  fluconazole (DIFLUCAN) 100 MG tablet Take 100 mg by mouth daily. 08/31/17  Yes [provider]  fluticasone (FLONASE) 50 MCG/ACT nasal spray Place 2 sprays into both nostrils 2 (two) times daily. 02/01/17  Yes [provider]  gabapentin (NEURONTIN) 100 MG capsule Take 100 mg by mouth daily. 05/10/17  Yes [provider]    HYDROcodone-acetaminophen (NORCO/VICODIN) 5-325 MG tablet Take 1 tablet by mouth 4 (four) times daily as needed for pain. 05/30/17  Yes [provider]  lidocaine (XYLOCAINE) 5 % ointment Apply 1 application topically 4 (four) times daily as needed for rash. 08/30/17  Yes [provider]  lisinopril (PRINIVIL,ZESTRIL) 40 MG tablet Take 40 mg by mouth daily. 08/26/17  Yes [provider]  montelukast (SINGULAIR) 10 MG tablet Take 10 mg by mouth at bedtime. 08/31/17  Yes [provider]  omeprazole (PRILOSEC) 40 MG capsule Take 40 mg by mouth daily.  06/05/17  Yes [provider]  rosuvastatin (CRESTOR) 10 MG tablet Take 10 mg by mouth at bedtime.    Yes [provider]  valACYclovir (VALTREX) 500 MG tablet Take 500 mg by mouth 2 (two) times daily.  08/31/17  Yes [provider]  amLODipine (NORVASC) 10 MG tablet Take 1 tablet (10 mg total) by mouth daily. Patient not taking: Reported on 09/03/2017 06/25/13   Micki RileySethi, Pramod S, MD  chlorthalidone (HYGROTON) 25 MG tablet Take 25 mg by mouth at bedtime.    [provider]  lamoTRIgine (LAMICTAL) 25 MG tablet Take 25 mg by mouth 2 (two) times daily. 04/16/17   [provider]  lansoprazole (PREVACID) 30 MG capsule Take 1 capsule (30 mg total) by mouth daily at 12 noon. Patient not taking: Reported on 02/20/2017 08/08/13   Benjiman CorePickering, Nathan, MD  lisinopril (PRINIVIL,ZESTRIL) 20 MG tablet Take 1 tablet (20 mg total) by mouth daily. Patient not taking: Reported on 09/03/2017 09/23/13   Micki RileySethi, Pramod S, MD  potassium chloride SA (K-DUR,KLOR-CON) 20 MEQ tablet Take 1 tablet (20 mEq total) by mouth 2 (two) times daily for 5 days. Patient not taking: Reported on 06/14/2017 05/24/17 05/29/17  Maia PlanLong, Joshua G, MD    Scheduled Meds: . fentaNYL (SUBLIMAZE) injection  25 mcg Intravenous Once  . folic acid  1 mg Intravenous Daily  . heparin  5,000 Units Subcutaneous Q8H  . thiamine injection  100 mg  Intravenous Daily   Infusions: . sodium chloride 250 mL (09/04/17 0524)  . azithromycin    . cefTRIAXone (ROCEPHIN)  IV    . dexmedetomidine (PRECEDEX) IV infusion    . famotidine (PEPCID) IV 20 mg (09/04/17 1126)  . lactated ringers 100 mL/hr at 09/04/17 1143  . metronidazole 500 mg (09/04/17 1336)   PRN Meds: sodium chloride   Allergies as of 09/03/2017 - Review Complete 09/03/2017  Allergen Reaction Noted  . Ciprofloxacin Itching, Nausea And Vomiting, and Rash 12/24/2015  . Codeine Hives 11/03/2014  . Sulfa antibiotics Hives 05/30/2013  .  Tramadol Hives 12/01/2014    Family History  Problem Relation Age of Onset  . Lung cancer Father   . Diabetes Brother   . Heart attack Brother   . Hypertension Brother   . Hypertension Sister     Social History   Socioeconomic History  . Marital status: Divorced    Spouse name: Not on file  . Number of children: 1  . Years of education: Not on file  . Highest education level: Not on file  Occupational History  . Occupation: Disablity   Social Needs  . Financial resource strain: Not on file  . Food insecurity:    Worry: Not on file    Inability: Not on file  . Transportation needs:    Medical: Not on file    Non-medical: Not on file  Tobacco Use  . Smoking status: Current Every Day Smoker    Packs/day: 0.50    Years: 20.00    Pack years: 10.00  . Smokeless tobacco: Never Used  Substance and Sexual Activity  . Alcohol use: Yes    Alcohol/week: 6.0 oz    Types: 5 Cans of beer, 5 Shots of liquor per week    Comment: BAC was 247  . Drug use: No  . Sexual activity: Not on file  Lifestyle  . Physical activity:    Days per week: Not on file    Minutes per session: Not on file  . Stress: Not on file  Relationships  . Social connections:    Talks on phone: Not on file    Gets together: Not on file    Attends religious service: Not on file    Active member of club or organization: Not on file    Attends meetings of  clubs or organizations: Not on file    Relationship status: Not on file  . Intimate partner violence:    Fear of current or ex partner: Not on file    Emotionally abused: Not on file    Physically abused: Not on file    Forced sexual activity: Not on file  Other Topics Concern  . Not on file  Social History Narrative   Patient is right handed and lives at home with family.Patient drinks caffinated drinks.    REVIEW OF SYSTEMS: Constitutional: Weakness, fatigue. ENT:  No nose bleeds Pulm: No shortness of breath.  New onset cough. CV:  No palpitations, no LE edema.  No chest pain. GU:  No hematuria, no frequency GI:  Per HPI.   Heme: Says she bleeds easily.  Remote anemia as a young woman Transfusions: No previous transfusions. Neuro:  No headaches, no peripheral tingling or numbness Derm:  No itching, no rash or sores.  Endocrine:  No sweats or chills.  No polyuria or dysuria Immunization: Not queried. Travel:  None beyond local counties in last few months.    PHYSICAL EXAM: Vital signs in last 24 hours: Vitals:   09/04/17 1445 09/04/17 1506  BP:    Pulse: (!) 114   Resp: (!) 30   Temp:    SpO2: (!) 87% 93%   Wt Readings from Last 3 Encounters:  09/04/17 124 lb 1.9 oz (56.3 kg)  05/24/17 113 lb (51.3 kg)  08/03/16 103 lb (46.7 kg)    General: Unwell looking alert, somewhat pale WF. Head: No facial asymmetry or swelling.  No signs of head trauma. Eyes: No scleral icterus.  No conjunctival pallor.  EOMI. Ears: Not hard of hearing. Nose: No congestion  or discharge. Mouth: Dry mucous membranes.  Tongue midline.  No oral lesions. Neck: No JVD, no masses, no thyromegaly. Lungs: Diminished with some crackles at the bases.  No labored breathing.  Occasional wet quality cough.  Screamed out in pain when I asked her to lean forward for lung exam, the pain was coming from her abdomen. Heart: Regular, tachycardic.  No MRG. Abdomen: Soft.  Diffusely tender.  No guarding or  rebound.  Bowel sounds scant but not tinkling or tympanitic.Marland Kitchen   Rectal: Deferred Musc/Skeltl: No joint redness, swelling or gross deformity. Extremities: It are warm.  No CCE. Neurologic: Oriented to herself, place.  York Spaniel the year was 2020.  Mostly appropriate and conversant.  Able to move all 4 limbs. Skin: No rashes, no telangiectasia, no significant purpura/bruising.  No suspicious lesions or sores. Tattoos: None. Nodes: No cervical adenopathy. Psych: Cooperative.  Slightly anxious.  Intake/Output from previous day: 06/23 0701 - 06/24 0700 In: 5094.9 [I.V.:794.8; IV Piggyback:4300.2] Out: 500 [Urine:500] Intake/Output this shift: Total I/O In: 703.5 [I.V.:509.2; IV Piggyback:194.3] Out: 340 [Urine:340]  LAB RESULTS: Recent Labs    09/03/17 1210 09/04/17 0241  WBC 3.9* 1.9*  HGB 15.4* 11.4*  HCT 47.5* 35.5*  PLT 222 179   BMET Lab Results  Component Value Date   NA 146 (H) 09/04/2017   NA 144 09/04/2017   NA 139 09/03/2017   K 3.1 (L) 09/04/2017   K 5.7 (H) 09/04/2017   K 2.8 (L) 09/03/2017   CL 117 (H) 09/04/2017   CL 119 (H) 09/04/2017   CL 103 09/03/2017   CO2 19 (L) 09/04/2017   CO2 16 (L) 09/04/2017   CO2 22 09/03/2017   GLUCOSE 98 09/04/2017   GLUCOSE 93 09/04/2017   GLUCOSE 133 (H) 09/03/2017   BUN 39 (H) 09/04/2017   BUN 36 (H) 09/04/2017   BUN 35 (H) 09/03/2017   CREATININE 1.96 (H) 09/04/2017   CREATININE 1.75 (H) 09/04/2017   CREATININE 2.03 (H) 09/03/2017   CALCIUM 7.4 (L) 09/04/2017   CALCIUM 6.7 (L) 09/04/2017   CALCIUM 9.4 09/03/2017   LFT Recent Labs    09/03/17 1210 09/04/17 0241  PROT 6.7 4.3*  ALBUMIN 3.6 2.0*  AST 93* 53*  ALT 40 23  ALKPHOS 446* 231*  BILITOT 0.9 0.8   PT/INR Lab Results  Component Value Date   INR 1.02 11/03/2014   Hepatitis Panel No results for input(s): HEPBSAG, HCVAB, HEPAIGM, HEPBIGM in the last 72 hours. C-Diff No components found for: CDIFF Lipase     Component Value Date/Time   LIPASE 26  09/03/2017 1631    Drugs of Abuse     Component Value Date/Time   LABOPIA POSITIVE (A) 09/04/2017 0241   COCAINSCRNUR NONE DETECTED 09/04/2017 0241   LABBENZ NONE DETECTED 09/04/2017 0241   AMPHETMU NONE DETECTED 09/04/2017 0241   THCU NONE DETECTED 09/04/2017 0241   LABBARB (A) 09/04/2017 0241    Result not available. Reagent lot number recalled by manufacturer.     RADIOLOGY STUDIES: Ct Abdomen Pelvis Wo Contrast  Result Date: 09/03/2017 CLINICAL DATA:  Generalized abdominal pain, diarrhea. EXAM: CT ABDOMEN AND PELVIS WITHOUT CONTRAST TECHNIQUE: Multidetector CT imaging of the abdomen and pelvis was performed following the standard protocol without IV contrast. COMPARISON:  CT scan of February 20, 2017. FINDINGS: Lower chest: Mild bilateral posterior basilar subsegmental atelectasis is noted. Hepatobiliary: No focal liver abnormality is seen. No gallstones, gallbladder wall thickening, or biliary dilatation. Pancreas: Unremarkable. No pancreatic ductal dilatation or surrounding  inflammatory changes. Spleen: Normal in size without focal abnormality. Adrenals/Urinary Tract: Adrenal glands are unremarkable. Bilateral nonobstructive nephrolithiasis is noted. No hydronephrosis or renal obstruction is noted. No ureteral calculi are noted. Urinary bladder is decompressed. Stomach/Bowel: The stomach appears normal. The appendix appears normal. Stool is noted throughout the colon. Wall thickening of descending and proximal sigmoid colon is noted consistent with infectious or inflammatory colitis. Vascular/Lymphatic: Aortic atherosclerosis. No enlarged abdominal or pelvic lymph nodes. Reproductive: Uterus and bilateral adnexa are unremarkable. Other: Small amount of free fluid is noted in the pelvis. No hernia is noted. Musculoskeletal: No acute or significant osseous findings. IMPRESSION: Wall thickening of descending and proximal sigmoid colon is noted consistent with infectious or inflammatory colitis.  Bilateral nonobstructive nephrolithiasis. Mild bilateral posterior basilar subsegmental atelectasis. Aortic Atherosclerosis (ICD10-I70.0). Electronically Signed   By: Lupita Raider, M.D.   On: 09/03/2017 18:25   Ct Head Wo Contrast  Result Date: 09/04/2017 CLINICAL DATA:  62 year old female with altered mental status. EXAM: CT HEAD WITHOUT CONTRAST TECHNIQUE: Contiguous axial images were obtained from the base of the skull through the vertex without intravenous contrast. COMPARISON:  Head CT dated 02/20/2017 FINDINGS: Brain: The ventricles and sulci appropriate size for patient's age. Small old right thalamic infarct again noted. There is no acute intracranial hemorrhage. No mass effect or midline shift. No extra-axial fluid collection. Vascular: No hyperdense vessel or unexpected calcification. Skull: Normal. Negative for fracture or focal lesion. Sinuses/Orbits: No acute finding. Other: None IMPRESSION: 1. No acute intracranial pathology. 2. 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 Electronically Signed   By: Elgie Collard M.D.   On: 09/04/2017 02:20   Dg Chest Port 1 View  Result Date: 09/04/2017 CLINICAL DATA:  Sepsis. EXAM: PORTABLE CHEST 1 VIEW COMPARISON:  Chest x-ray from yesterday. FINDINGS: The heart size and mediastinal contours are within normal limits. Normal pulmonary vascularity. Low lung volumes with new patchy consolidation in the left upper and lower lobes, as well as the right mid and infrahilar lung. No pleural effusion or pneumothorax. No acute osseous abnormality. IMPRESSION: 1. New left-greater-than-right airspace disease, concerning for multifocal pneumonia or ARDS. Electronically Signed   By: Obie Dredge M.D.   On: 09/04/2017 11:22   Dg Chest Portable 1 View  Result Date: 09/03/2017 CLINICAL DATA:  Fever. EXAM: PORTABLE CHEST 1 VIEW COMPARISON:  Radiographs of May 24, 2017. FINDINGS: The heart size and mediastinal contours are within normal limits. No pneumothorax or  pleural effusion is noted. Right lung is clear. Minimal left basilar atelectasis or infiltrate is noted. The visualized skeletal structures are unremarkable. IMPRESSION: Minimal left basilar atelectasis or infiltrate is noted. Electronically Signed   By: Lupita Raider, M.D.   On: 09/03/2017 14:11   Dg Abd Portable 1v  Result Date: 09/04/2017 CLINICAL DATA:  Severe abdominal pain. Abdominal distention. Recent diagnosis of colitis. EXAM: PORTABLE ABDOMEN - 1 VIEW COMPARISON:  CT abdomen and pelvis 09/03/2017. FINDINGS: Mildly dilated loops of small bowel are present throughout the abdomen. There is gas within the colon. Thickened colonic wall is again noted. Obstruction or free air is present. Right greater than left lower lobe airspace disease is present. IMPRESSION: 1. Mildly dilated loops of small bowel throughout the abdomen with gas in the colon. Findings are compatible with adynamic ileus. 2. Thickening of the colonic wall compatible with colitis. 3. Bibasilar airspace disease compatible with multi lobar pneumonia versus ARDS. Electronically Signed   By: Marin Roberts M.D.   On: 09/04/2017 13:10  Dg Abd Portable 1 View  Result Date: 09/03/2017 CLINICAL DATA:  Acute generalized abdominal pain, diarrhea. EXAM: PORTABLE ABDOMEN - 1 VIEW COMPARISON:  Radiograph of Jul 25, 2017. FINDINGS: The bowel gas pattern is normal. No radio-opaque calculi or other significant radiographic abnormality are seen. IMPRESSION: No evidence of bowel obstruction or ileus. Electronically Signed   By: Lupita Raider, M.D.   On: 09/03/2017 14:09    IMPRESSION:   *    Sepsis inpatient with colitis and pneumonia.  *    Acute colitis.  Given distribution and degree of pain, suspect ischemic rather than infectious.  On broad-spectrum antibiotic coverage.  *    Alcohol abuse, possible dependence.    *   Pneumonia, possible ARDS.  *   Hyperkalemia, followed by hypokalemia  *   Narcotics requiring chronic  Musculoskeletal pain  *   AKI.    PLAN:     *   Keep her n.p.o. for now.  Continue IV Pepcid.  General surgery is also consulting on patient currently.  CIWA protocol in place.   Spoke with CCM and they are upping LR rate from 100 to 125 ml/hour.   Chest and abd films ordered for AM.      Jennye Moccasin  09/04/2017, 3:53 PM Phone [phone]      [facility] Attending   I have taken an interval history, reviewed the chart and examined the patient. I agree with the Advanced Practitioner's note, impression and recommendations.   I think the pattern of colitis she has is c/w ischemic colitis and would think it is a secondary not primary event in her illness. Don't know that but that is my current thought.  Await testing - continue aggressive supportive care.  Her abdomen is soft and though very tender I do not think anything surgical going on.  Iva Boop, MD, [facility]  Gastroenterology 09/04/2017 6:05 PM

## 2017-09-04 NOTE — Progress Notes (Signed)
  Echocardiogram 2D Echocardiogram has been performed.  Karin Griffith T Charlii Yost 09/04/2017, 9:22 AM

## 2017-09-04 NOTE — Procedures (Signed)
Intubation Procedure Note KYNLEE KOENIGSBERG [id number] 12-25-1955  Procedure: Intubation Indications: Respiratory insufficiency  Procedure Details Consent: Risks of procedure as well as the alternatives and risks of each were explained to the (patient/caregiver).  Consent for procedure obtained. Time Out: Verified patient identification, verified procedure, site/side was marked, verified correct patient position, special equipment/implants available, medications/allergies/relevent history reviewed, required imaging and test results available.  Performed  Maximum sterile technique was used including N/A.  MAC and 3  Grade 1 view. Easy intubation with 7.57F ETT.  Evaluation Hemodynamic Status: BP stable throughout; O2 sats: transiently fell during during procedure Patient's Current Condition: stable Complications: No apparent complications Patient did tolerate procedure well. Chest X-ray ordered to verify placement.  CXR: pending.   Einar Grad Welby Montminy 09/04/2017

## 2017-09-04 NOTE — Progress Notes (Signed)
eLink Physician-Brief Progress Note Patient Name: Vonzell SchlatterBonnie M Lindamood DOB: 08-31-1955 MRN: [id number]   Date of Service  09/04/2017  HPI/Events of Note  Asked to review CXR for ETT and NGT placement. NGT tip In mid stomach and ETT appears to be 2.5 - 3.0 cm above the carina.   eICU Interventions  Satisfactory position of ETT and NGT. Continue current management.      Intervention Category Intermediate Interventions: Diagnostic test evaluation  Lenell AntuSommer,Jes Costales [place] 09/04/2017, 7:36 PM

## 2017-09-04 NOTE — Progress Notes (Signed)
Family called to be notified of patient's intubation. Made contact with patients sister and son, family is understanding at this time and were prepared this was something that could happen. NP updated family at length this afternoon. Will continue to monitor and provide support to patient and family.

## 2017-09-04 NOTE — Progress Notes (Signed)
eLink Physician-Brief Progress Note Patient Name: Vonzell SchlatterBonnie M Tiberio DOB: 05-02-1955 MRN: [id number]   Date of Service  09/04/2017  HPI/Events of Note  K+ = 5.7 at 2:41 AM/  eICU Interventions  Will order: 1. BMP at 7 AM.     Intervention Category Major Interventions: Electrolyte abnormality - evaluation and management  Altie Savard Eugene 09/04/2017, 3:30 AM

## 2017-09-04 NOTE — Progress Notes (Signed)
eLink Physician-Brief Progress Note Patient Name: [name] DOB: 03/17/1955 MRN: [id number]   Date of Service  09/04/2017  HPI/Events of Note  ABG on 100%/PRVC 16/TV 380/P 5 = 7.055/62.8/92.0/17.4.  eICU Interventions  Will order: 1. Increase PRVC rate to 30. 2. NaHCO3 IV infusion at 40 mL/hour. 3. Repeat ABG at 11 PM.      Intervention Category Major Interventions: Acid-Base disturbance - evaluation and management;Respiratory failure - evaluation and management  Lenell AntuSommer,Romayne Ticas [place] 09/04/2017, 8:23 PM

## 2017-09-04 NOTE — Progress Notes (Signed)
Pt placed on heated HFNC at this time per MD order.  Pt tolerating well, RT will continue to monitor

## 2017-09-05 ENCOUNTER — Inpatient Hospital Stay (HOSPITAL_COMMUNITY): Payer: Medicare Other

## 2017-09-05 DIAGNOSIS — D539 Nutritional anemia, unspecified: Secondary | ICD-10-CM

## 2017-09-05 LAB — COMPREHENSIVE METABOLIC PANEL
ALT: 20 U/L (ref 0–44)
AST: 47 U/L — ABNORMAL HIGH (ref 15–41)
Albumin: 1.8 g/dL — ABNORMAL LOW (ref 3.5–5.0)
Alkaline Phosphatase: 168 U/L — ABNORMAL HIGH (ref 38–126)
Anion gap: 5 (ref 5–15)
BUN: 35 mg/dL — ABNORMAL HIGH (ref 6–20)
CO2: 21 mmol/L — ABNORMAL LOW (ref 22–32)
Calcium: 7.3 mg/dL — ABNORMAL LOW (ref 8.9–10.3)
Chloride: 119 mmol/L — ABNORMAL HIGH (ref 98–111)
Creatinine, Ser: 1.21 mg/dL — ABNORMAL HIGH (ref 0.44–1.00)
GFR calc Af Amer: 56 mL/min — ABNORMAL LOW (ref >60)
GFR calc non Af Amer: 48 mL/min — ABNORMAL LOW (ref >60)
Glucose, Bld: 95 mg/dL (ref 70–99)
Potassium: 3.2 mmol/L — ABNORMAL LOW (ref 3.5–5.1)
Sodium: 145 mmol/L (ref 135–145)
Total Bilirubin: 0.6 mg/dL (ref 0.3–1.2)
Total Protein: 4.3 g/dL — ABNORMAL LOW (ref 6.5–8.1)

## 2017-09-05 LAB — GLUCOSE, CAPILLARY
Glucose-Capillary: 146 mg/dL — ABNORMAL HIGH (ref 70–99)
Glucose-Capillary: 66 mg/dL — ABNORMAL LOW (ref 70–99)
Glucose-Capillary: 83 mg/dL (ref 70–99)
Glucose-Capillary: 83 mg/dL (ref 70–99)
Glucose-Capillary: 93 mg/dL (ref 70–99)
Glucose-Capillary: 93 mg/dL (ref 70–99)
Glucose-Capillary: 94 mg/dL (ref 70–99)

## 2017-09-05 LAB — POCT I-STAT 3, ART BLOOD GAS (G3+)
Acid-base deficit: 7 mmol/L — ABNORMAL HIGH (ref 0.0–2.0)
Bicarbonate: 18.4 mmol/L — ABNORMAL LOW (ref 20.0–28.0)
O2 Saturation: 98 %
Patient temperature: 98.2
TCO2: 20 mmol/L — ABNORMAL LOW (ref 22–32)
pCO2 arterial: 37.7 mmHg (ref 32.0–48.0)
pH, Arterial: 7.296 — ABNORMAL LOW (ref 7.350–7.450)
pO2, Arterial: 108 mmHg (ref 83.0–108.0)

## 2017-09-05 LAB — CBC
HCT: 34.7 % — ABNORMAL LOW (ref 36.0–46.0)
Hemoglobin: 11 g/dL — ABNORMAL LOW (ref 12.0–15.0)
MCH: 31.9 pg (ref 26.0–34.0)
MCHC: 31.7 g/dL (ref 30.0–36.0)
MCV: 100.6 fL — ABNORMAL HIGH (ref 78.0–100.0)
Platelets: 177 10*3/uL (ref 150–400)
RBC: 3.45 MIL/uL — ABNORMAL LOW (ref 3.87–5.11)
RDW: 15.5 % (ref 11.5–15.5)
WBC: 7.2 10*3/uL (ref 4.0–10.5)

## 2017-09-05 LAB — PROCALCITONIN: Procalcitonin: 10.94 ng/mL

## 2017-09-05 LAB — HEPATITIS PANEL, ACUTE
HCV Ab: <0.1 s/co ratio (ref 0.0–0.9)
Hep A IgM: NEGATIVE
Hep B C IgM: NEGATIVE
Hepatitis B Surface Ag: NEGATIVE

## 2017-09-05 LAB — CALCIUM / CREATININE RATIO, URINE
Calcium, Ur: <0.8 mg/dL
Calcium/Creat.Ratio: <8 mg/g creat (ref 0–260)
Creatinine, Urine: 97 mg/dL

## 2017-09-05 LAB — LEGIONELLA PNEUMOPHILA SEROGP 1 UR AG: L. pneumophila Serogp 1 Ur Ag: NEGATIVE

## 2017-09-05 LAB — MAGNESIUM: Magnesium: 2.4 mg/dL (ref 1.7–2.4)

## 2017-09-05 LAB — LACTIC ACID, PLASMA: Lactic Acid, Venous: 1.6 mmol/L (ref 0.5–1.9)

## 2017-09-05 MED ORDER — DEXTROSE 50 % IV SOLN
INTRAVENOUS | Status: AC
  Administered 2017-09-05: 25 mL
  Filled 2017-09-05: qty 50

## 2017-09-05 MED ORDER — NICOTINE 21 MG/24HR TD PT24
21.0000 mg | MEDICATED_PATCH | Freq: Every day | TRANSDERMAL | Status: DC
  Administered 2017-09-06 – 2017-09-11 (×7): 21 mg via TRANSDERMAL
  Filled 2017-09-05 (×7): qty 1

## 2017-09-05 MED ORDER — VITAL HIGH PROTEIN PO LIQD: 1000.0000 mL | ORAL | Status: DC

## 2017-09-05 MED ORDER — SODIUM CHLORIDE 0.9 % IV BOLUS
1000.0000 mL | Freq: Once | INTRAVENOUS | Status: AC
  Administered 2017-09-05: 1000 mL via INTRAVENOUS

## 2017-09-05 MED ORDER — PNEUMOCOCCAL VAC POLYVALENT 25 MCG/0.5ML IJ INJ
0.5000 mL | INJECTION | INTRAMUSCULAR | Status: AC
  Administered 2017-09-07: 0.5 mL via INTRAMUSCULAR
  Filled 2017-09-05 (×2): qty 0.5

## 2017-09-05 MED ORDER — POLYETHYLENE GLYCOL 3350 17 G PO PACK
17.0000 g | PACK | Freq: Every day | ORAL | Status: DC
  Administered 2017-09-05 – 2017-09-11 (×7): 17 g
  Filled 2017-09-05 (×7): qty 1

## 2017-09-05 MED ORDER — POTASSIUM CHLORIDE 20 MEQ/15ML (10%) PO SOLN
30.0000 meq | ORAL | Status: AC
  Administered 2017-09-05 (×2): 30 meq
  Filled 2017-09-05 (×2): qty 30

## 2017-09-05 MED ORDER — PHENYLEPHRINE HCL-NACL 10-0.9 MG/250ML-% IV SOLN
0.0000 ug/min | INTRAVENOUS | Status: DC
  Administered 2017-09-05 (×2): 20 ug/min via INTRAVENOUS
  Filled 2017-09-05 (×2): qty 250

## 2017-09-05 NOTE — Progress Notes (Signed)
Initial Nutrition Assessment  DOCUMENTATION CODES:   Not applicable  INTERVENTION:   Trickle TF recommendations once appropriate: - Initiate Vital AF 1.2 @ 20 ml/hr (480 ml/day) to provide 576 kcal, 36 grams protein, and 389 ml free water.  Once appropriate and if warranted, recommend TF at goal rate of 50 ml/hr (1200 ml/day) to provide 1440 kcal, 90 grams protein, and 972 ml free water (meeting 97% of pt's estimated energy needs and 100% of estimated protein needs).  NUTRITION DIAGNOSIS:   Inadequate oral intake related to inability to eat as evidenced by NPO status.  GOAL:   Patient will meet greater than or equal to 90% of their needs  MONITOR:   Vent status, Labs, I & O's, Weight trends  REASON FOR ASSESSMENT:   Ventilator, Consult Enteral/tube feeding initiation and management  ASSESSMENT:   62 year old female who presented to ED with abdominal pain, diarrhea, and weakness. PMH significant for EtOH abuse, anxiety, depression, hypertension, hyperlipidemia, CVA, and fibromyalgia.  6/24 - pt intubated for respiratory insufficiency 6/25 - x-ray showed resolution of small bowel dilitation  Noted surgery recommendation for bowel rest for additional day with plan to start trickle feeds 6/26. Discussed TF consult with critical care NP who asked RD to leave TF recommendations but not place orders at this time. RD asked NP to re-consult nutrition once pt is appropriate to start trickle TF.  Discussed pt with RN who is aware of the plan to hold TF until pt is evaluated tomorrow then to begin TF at a trickle rate.  Spoke with pt's friend at bedside who reports pt does not eat well because "she is a longtime alcoholic." Pt's friend states that pt may consume 1-2 meals daily. A meal might include 1/2 sandwich or a banana. Pt's friend denies that pt has experienced recent weight loss. Recent weights in pt's chart appear to be stated rather than measured so unsure of  accuracy.  Patient is currently intubated on ventilator support. Pt with OG tube coiled within the stomach. MV: 11.6 L/min Temp (24hrs), Avg:98.8 F (37.1 C), Min:98.2 F (36.8 C), Max:99.6 F (37.6 C) BP: 110/76 MAP: 87  Precedex: 9.9 ml/hr Fentanyl: 2.5 ml/hr Lactated ringers: 75 ml/hr Neosynephrine: 28.5 ml/hr Sodium bicarb: 50 ml/hr  Medications reviewed and include: 1 mg folic acid daily, 30 mEq KCl x 2 runs today, 100 mg thiamine daily, 20 mg Pepcid daily  Labs reviewed: potassium 3.2 (L), chloride 119 (H), CO2 21 (L), BUN 35 (H), creatinine 1.21 (H), calcium 7.3 (L), hemoglobin 11.0 (L), HCT 34.7 (L) CBG's: 146, 66, 83, 122, 97, 78, 77 x 24 hours  UOP: 1700 ml x 24 hours I/O's: +8.5 L since admission  NUTRITION - FOCUSED PHYSICAL EXAM:    Most Recent Value  Orbital Region  Mild depletion  Upper Arm Region  Mild depletion  Thoracic and Lumbar Region  No depletion  Buccal Region  No depletion  Temple Region  No depletion  Clavicle Bone Region  Mild depletion  Clavicle and Acromion Bone Region  Mild depletion  Scapular Bone Region  Unable to assess  Dorsal Hand  No depletion  Patellar Region  Mild depletion  Anterior Thigh Region  Mild depletion  Posterior Calf Region  No depletion  Edema (RD Assessment)  None  Hair  Reviewed  Eyes  Unable to assess  Mouth  Unable to assess  Skin  Reviewed  Nails  Reviewed       Diet Order:   Diet Order  Diet NPO time specified  Diet effective now          EDUCATION NEEDS:   No education needs have been identified at this time  Skin:  Skin Assessment: Reviewed RN Assessment  Last BM:  09/04/17 large type 5  Height:   Ht Readings from Last 1 Encounters:  09/04/17 5\' 1"  (1.549 m)    Weight:   Wt Readings from Last 1 Encounters:  09/05/17 129 lb 3 oz (58.6 kg)    Ideal Body Weight:  47.7 kg  BMI:  Body mass index is 24.41 kg/m.  Estimated Nutritional Needs:   Kcal:  1485  kcal/day  Protein:  75-90 grams/day  Fluid:  1.7 L/day    Earma ReadingKate Jablonski Hilda Rynders, MS, RD, LDN Pager: [phone] Weekend/After Hours: [phone]

## 2017-09-05 NOTE — Progress Notes (Signed)
eLink Physician-Brief Progress Note Patient Name: Carla SchlatterBonnie M Poffenberger DOB: [birth year] MRN: [id number]   Date of Service  09/05/2017  HPI/Events of Note  Tobacco abuse - Patient smokes 1.5 packs/day.  eICU Interventions  Will order: 1. Nicotine Patch 21 mg to skin now and Q day.      Intervention Category Major Interventions: Other:  Lenell AntuSommer,Dawood Spitler [place] 09/05/2017, 11:17 PM

## 2017-09-05 NOTE — Progress Notes (Signed)
..  [facility] FOR [facility] LAB REPLACEMENT ONLY  The [facility] based on the criteria listed below:   1. Is GFR >/= 40 ml/min? Yes.    Patient's GFR today is 48 2. Is urine output >/= 0.5 ml/kg/hr for the last 6 hours? Yes.   Patient's UOP is 1.3 ml/kg/hr 3. Is BUN < 60 mg/dL? Yes.    Patient's BUN today is 35 4. Abnormal electrolyte(s): K+3.2) 5. Ordered repletion with: protocol 6. If a panic level lab has been reported, has the CCM MD in charge been notified? No..   Physician:  Dr. Althia FortsSommer  Carla Bowen, Carla ArgueJulia W 09/05/2017 6:06 AM

## 2017-09-05 NOTE — Progress Notes (Addendum)
.. ..  Name: Carla Bowen MRN: [id number] DOB: December 05, 1955    ADMISSION DATE:  09/03/2017 CONSULTATION DATE:  09/04/2017  REFERRING MD :  Jani Gravel MD  CHIEF COMPLAINT:  Abdominal pain. S/p fall. Hypotension  BRIEF PATIENT DESCRIPTION:  62 yr old female with PMHx Anxiety, Depression, Fibromyalgia, High cholesterol, Hypertension, and CVA p/w abdominal pain, febrile and hypotensive. Received 3.5L IVF and remains hypotensive PCCM consulted for admission  SUBJECTIVE:  Intubated yesterday afternoon for progressing hypoxic resp failure Remains on precedex 0.8 mcg/kg/hr, low dose neosynephrine 20 mcg/min, fentanyl 75 mcg/hr, and bicarb at 50 ml/hr  UOP adequate/ sCr improving Remains on FiO2 100% and PEEP 10 AXR - resolution of bowel dilation   VITAL SIGNS: Temp:  [98.2 F (36.8 C)-99.6 F (37.6 C)] 98.3 F (36.8 C) (06/25 0312) Pulse Rate:  [85-125] 85 (06/25 0600) Resp:  [15-39] 18 (06/25 0600) BP: (61-183)/(35-87) 108/66 (06/25 0600) SpO2:  [63 %-100 %] 99 % (06/25 0600) FiO2 (%):  [80 %-100 %] 100 % (06/25 0415) Weight:  [129 lb 3 oz (58.6 kg)] 129 lb 3 oz (58.6 kg) (06/25 0500)  PHYSICAL EXAMINATION: General:  Critically ill adult female on MV in NAD HEENT: MM pink/moist, pupils 4/reactive, ETT/OGT, +JVD Neuro: Sedated, but awakens to voice, f/c, MAE, denies pain CV:  rrr, no m/r/g PULM: even/non-labored on MV, breathing over set rate of 30, lungs bilaterally rales/rhonchi-  Minimal secretions GI: improving distention, softer, less tenderness, no guarding, +BS  Extremities: cool/dry, no edema  Skin: no rashes    Recent Labs  Lab 09/04/17 0639 09/04/17 1701 09/05/17 0241  NA 146* 144 145  K 3.1* 4.2 3.2*  CL 117* 120* 119*  CO2 19* 13* 21*  BUN 39* 41* 35*  CREATININE 1.96* 1.36* 1.21*  GLUCOSE 98 81 95   Recent Labs  Lab 09/03/17 1210 09/04/17 0241 09/05/17 0241  HGB 15.4* 11.4* 11.0*  HCT 47.5* 35.5* 34.7*  WBC 3.9* 1.9* 7.2  PLT 222 179 177     SIGNIFICANT EVENTS  6/23 admitted overnight/ hypotensive/ abd pain 6/24 worsening hypoxia -intubated that afternoon  STUDIES:  6/23 CT Ab/Pelv: Wall thickening of descending and proximal sigmoid colon is noted consistent with infectious or inflammatory colitis. Bilateral nonobstructive nephrolithiasis. Mild bilateral posterior basilar subsegmental atelectasis.  6/24 CXR >> New left-greater-than-right airspace disease, concerning for multifocal pneumonia or ARDS  6/24  AXR >> 1. Mildly dilated loops of small bowel throughout the abdomen with gas in the colon. Findings are compatible with adynamic ileus. 2. Thickening of the colonic wall compatible with colitis. 3. Bibasilar airspace disease compatible with multi lobar pneumonia versus ARDS  6/25 AXR >> Resolution of small bowel dilatation. Nasogastric catheter within the stomach.  6/24 TTE >>  Left ventricle: The cavity size was normal. Systolic function was   normal. The estimated ejection fraction was in the range of 55%   to 60%. Although no diagnostic regional wall motion abnormality   was identified, this possibility cannot be completely excluded on   the basis of this study. Left ventricular diastolic function   parameters were normal.  6/25 CXR >> Improved aeration on the left with increased infiltrative changes on the right.  Satisfactory tube/lines  Cultures:  6/23 BC x 2 >> ngtd >> 6/23 GI panel PCR >> neg 6/24 MRSA PCR >> neg 6/24 Resp panel >> neg 6/24 UC >> neg 6/24 urine strep >> neg 6/24 urine legionella >> 6/25 trach aspirate >>  Antibiotics:  6/23 azithromycin >>  6/23 ceftriaxone >> 6/25 6/23 Flagyl >> 6/25 6/25 unasyn >>  Lines:  6/24 ETT >> 6/24 OGT >> 6/24 Foley >> PIV   ASSESSMENT / PLAN: NEURO: Acute metabolic encephalopathy - improved Concern for withdrawal h/o ETOH use- last ETOH use was 6/23 AM, 1/5 liquor/week  P:  PAD protocol w/ precedex and fentanyl gtt Daily  WUA Frequent neuro checks Thiamine and Folate   CARDIAC Septic Shock  P:  Tele monitoring neosynephrine for Goal MAP > 65 Lactate cleared ECHO as above, EF 02-11%, normal diastolic fxn  PULMONARY: Acute hypoxic respiratory failure  MPNA /ARDS - possible aspiration  P:  Full MV support PRVC 8 cc/kg, rate 30 Repeat ABG noted- compensated met acidosis  Trend CXR  VAP measures Legionella pending See ID  Hold any diuresis while on pressors   ID: Severe Sepsis secondary to Colitis PNA -suspected Aspiration given hx vomiting P:  Follow BC Sending trach asp D/c flagyl/ ceftriaxone -> start unasyn to cover asp PNA Continue azithro till legionella results Trend PCT  Trend WBC, fever curve GI panel PCR, RVP neg  Endocrine: Hypoglycemia P:  CBG q 4  Add D5 to bicarb gtt/ adding TF   GI: Infectious and Inflammatory Colitis  Ileus - 6/24 Protein calorie malnutrition - AXR 6/25 improved- resolution of SB dilation, gastric tube in stomach  - lactate continue to be wnl - LFTs stable - lactates remain neg, doubt ischemic  P:  --see ID  -appreciate GI and surgery input - bowel rest for additional day per surgery, start trickle feeds 6/26 - goal K> 4, Mag > 2  Heme: Leukopenia - improved Thrombocytopenia - stable P:  Trend CBC continue hep [place] and SCDs  RENAL Acute Kidney Injury - improving Anion Gap metabolic / lactic/ hyperchloremic acidosis -  Mild hypernatremia- resolved Hypokalemia/ hypomag - 6/25 lactate cleared, AKI improving, ongoing metabolic acidosis r/t hyperchloremia  P:  Reduce LR to 75 ml/hr Bicarb gtt at 50 ml/hr, (changed to be mixed with D5) S/p K replete Trend BMP /phos/ mag/ urinary output Replace electrolytes as indicated   DVT prophylaxis: SCDs/ heparin SQ SUP: Pepcid Diet: NPO Disposition : ICU  Family: Patient's sister and young adult son updated extensively at bedside 6/24 afternoon.  Son emotionally upset and looks to his aunt for  guidance on decision making.  No family at bedside 6/25 am rounds.   Kennieth Rad, AGACNP-BC [facility] Pgr: [phone] or if no answer [phone] 09/05/2017, 7:30 AM

## 2017-09-05 NOTE — Progress Notes (Addendum)
eLink Physician-Brief Progress Note Patient Name: Vonzell SchlatterBonnie M Savoca DOB: [birth year] MRN: [id number]   Date of Service  09/05/2017  HPI/Events of Note  Hypotension - SBP dropped into 60's. Now improved into 90's s/p holding Fentanyl IV infusion.   eICU Interventions  Will order: 1. Bolus with 0.9 NaCl 1 liter IV over 1 hour now. 2. Phenylephrine IV infusion. Titrate to MAP > 65.      Intervention Category Major Interventions: Hypotension - evaluation and management  Loretto Belinsky Dennard Nipugene 09/05/2017, 12:59 AM

## 2017-09-05 NOTE — Progress Notes (Signed)
eLink Physician-Brief Progress Note Patient Name: Carla Bowen DOB: 07-09-1955 MRN: [id number]   Date of Service  09/05/2017  HPI/Events of Note  Repeat ABG on 100%/PRVC 30/TV 380/P 5 = 7.303/32.4/87.0  eICU Interventions  Will order: 1. Increase PEEP to 10. 2. Repeat ABG in AM.     Intervention Category Major Interventions: Acid-Base disturbance - evaluation and management;Respiratory failure - evaluation and management  Zamier Eggebrecht [place] 09/05/2017, 1:04 AM

## 2017-09-05 NOTE — Progress Notes (Addendum)
Daily Rounding Note  09/05/2017, 10:57 AM  LOS: 2 days   SUBJECTIVE:   Intubated yesterday for progressive hypoxic respiratory failure, remains on Precedex and low-dose Neo-Synephrine, fentanyl and bicarb. Soft brown stool times yesterday.  None today.  OBJECTIVE:         Vital signs in last 24 hours:    Temp:  [98.2 F (36.8 C)-99.6 F (37.6 C)] 98.2 F (36.8 C) (06/25 0748) Pulse Rate:  [76-125] 76 (06/25 0945) Resp:  [15-39] 30 (06/25 0945) BP: (61-183)/(35-87) 68/49 (06/25 0945) SpO2:  [63 %-100 %] 99 % (06/25 0945) FiO2 (%):  [70 %-100 %] 70 % (06/25 0800) Weight:  [129 lb 3 oz (58.6 kg)] 129 lb 3 oz (58.6 kg) (06/25 0500) Last BM Date: 09/04/17 Filed Weights   09/03/17 1151 09/04/17 0332 09/05/17 0500  Weight: 113 lb (51.3 kg) 124 lb 1.9 oz (56.3 kg) 129 lb 3 oz (58.6 kg)   General: Intubated, sedated, on vent. Heart: RRR. Chest: Clear bilaterally.  Breathing quietly on. Abdomen: Soft.  Question tenderness, nonfocal.  Bowel sounds hypoactive. Extremities: No edema. Neuro/Psych: Patient sedated  Intake/Output from previous day: 06/24 0701 - 06/25 0700 In: 5299.7 [I.V.:3558.6; IV Piggyback:1741.1] Out: 1700 [Urine:1700]  Intake/Output this shift: Total I/O In: 518.9 [I.V.:518.9] Out: 250 [Urine:225; Emesis/NG output:25]  Lab Results: Recent Labs    09/03/17 1210 09/04/17 0241 09/05/17 0241  WBC 3.9* 1.9* 7.2  HGB 15.4* 11.4* 11.0*  HCT 47.5* 35.5* 34.7*  PLT 222 179 177   BMET Recent Labs    09/04/17 0639 09/04/17 1701 09/05/17 0241  NA 146* 144 145  K 3.1* 4.2 3.2*  CL 117* 120* 119*  CO2 19* 13* 21*  GLUCOSE 98 81 95  BUN 39* 41* 35*  CREATININE 1.96* 1.36* 1.21*  CALCIUM 7.4* 7.8* 7.3*   LFT Recent Labs    09/03/17 1210 09/04/17 0241 09/04/17 1701 09/05/17 0241  PROT 6.7 4.3*  --  4.3*  ALBUMIN 3.6 2.0* 2.1* 1.8*  AST 93* 53*  --  47*  ALT 40 23  --  20  ALKPHOS 446*  231*  --  168*  BILITOT 0.9 0.8  --  0.6   PT/INR No results for input(s): LABPROT, INR in the last 72 hours. Hepatitis Panel Recent Labs    09/04/17 0319  HEPBSAG Negative  HCVAB <0.1  HEPAIGM Negative  HEPBIGM Negative    Studies/Results: Ct Abdomen Pelvis Wo Contrast  Result Date: 09/03/2017 CLINICAL DATA:  Generalized abdominal pain, diarrhea. EXAM: CT ABDOMEN AND PELVIS WITHOUT CONTRAST TECHNIQUE: Multidetector CT imaging of the abdomen and pelvis was performed following the standard protocol without IV contrast. COMPARISON:  CT scan of February 20, 2017. FINDINGS: Lower chest: Mild bilateral posterior basilar subsegmental atelectasis is noted. Hepatobiliary: No focal liver abnormality is seen. No gallstones, gallbladder wall thickening, or biliary dilatation. Pancreas: Unremarkable. No pancreatic ductal dilatation or surrounding inflammatory changes. Spleen: Normal in size without focal abnormality. Adrenals/Urinary Tract: Adrenal glands are unremarkable. Bilateral nonobstructive nephrolithiasis is noted. No hydronephrosis or renal obstruction is noted. No ureteral calculi are noted. Urinary bladder is decompressed. Stomach/Bowel: The stomach appears normal. The appendix appears normal. Stool is noted throughout the colon. Wall thickening of descending and proximal sigmoid colon is noted consistent with infectious or inflammatory colitis. Vascular/Lymphatic: Aortic atherosclerosis. No enlarged abdominal or pelvic lymph nodes. Reproductive: Uterus and bilateral adnexa are unremarkable. Other: Small amount of free fluid is noted in the pelvis. No  hernia is noted. Musculoskeletal: No acute or significant osseous findings. IMPRESSION: Wall thickening of descending and proximal sigmoid colon is noted consistent with infectious or inflammatory colitis. Bilateral nonobstructive nephrolithiasis. Mild bilateral posterior basilar subsegmental atelectasis. Aortic Atherosclerosis (ICD10-I70.0).  Electronically Signed   By: Marijo Conception, M.D.   On: 09/03/2017 18:25   Ct Head Wo Contrast  Result Date: 09/04/2017 CLINICAL DATA:  62 year old female with altered mental status. EXAM: CT HEAD WITHOUT CONTRAST TECHNIQUE: Contiguous axial images were obtained from the base of the skull through the vertex without intravenous contrast. COMPARISON:  Head CT dated 02/20/2017 FINDINGS: Brain: The ventricles and sulci appropriate size for patient's age. Small old right thalamic infarct again noted. There is no acute intracranial hemorrhage. No mass effect or midline shift. No extra-axial fluid collection. Vascular: No hyperdense vessel or unexpected calcification. Skull: Normal. Negative for fracture or focal lesion. Sinuses/Orbits: No acute finding. Other: None IMPRESSION: 1. No acute intracranial pathology. 2. 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 Electronically Signed   By: Anner Crete M.D.   On: 09/04/2017 02:20   Dg Chest Port 1 View  Result Date: 09/05/2017 CLINICAL DATA:  Follow-up pneumonia EXAM: PORTABLE CHEST 1 VIEW COMPARISON:  09/04/2017 FINDINGS: Endotracheal tube and nasogastric catheter are again seen in satisfactory position. The lungs are again hypoinflated. Some improved aeration is noted in the left lung although increasing patchy infiltrates are noted within the right lung. Portion of this may be related increasing pulmonary edema. No sizable effusion is noted at this time. No bony abnormality is seen. IMPRESSION: Improved aeration on the left with increased infiltrative changes on the right. Electronically Signed   By: Inez Catalina M.D.   On: 09/05/2017 07:31   Dg Abd Portable 1v  Result Date: 09/05/2017 CLINICAL DATA:  Abdominal pain EXAM: PORTABLE ABDOMEN - 1 VIEW COMPARISON:  09/04/2017 FINDINGS: Scattered large and small bowel gas is noted. The previously seen dilated loops of small bowel are not appreciated on this exam. No free air is seen. Nasogastric catheter is coiled  within the stomach. No bony abnormality is noted. IMPRESSION: Resolution of small bowel dilatation. Nasogastric catheter within the stomach. Electronically Signed   By: Inez Catalina M.D.   On: 09/05/2017 07:30   Scheduled Meds: . chlorhexidine gluconate (MEDLINE KIT)  15 mL Mouth Rinse BID  . fentaNYL (SUBLIMAZE) injection  50 mcg Intravenous Once  . folic acid  1 mg Intravenous Daily  . heparin  5,000 Units Subcutaneous Q8H  . mouth rinse  15 mL Mouth Rinse 10 times per day  . potassium chloride  30 mEq Per Tube Q4H  . thiamine injection  100 mg Intravenous Daily   Continuous Infusions: . sodium chloride 250 mL (09/05/17 0617)  . azithromycin 500 mg (09/04/17 1700)  . cefTRIAXone (ROCEPHIN)  IV 1 g (09/04/17 1556)  . dexmedetomidine (PRECEDEX) IV infusion 0.8 mcg/kg/hr (09/05/17 1100)  . famotidine (PEPCID) IV 20 mg (09/04/17 1126)  . fentaNYL infusion INTRAVENOUS 25 mcg/hr (09/05/17 1032)  . lactated ringers 75 mL/hr (09/05/17 0800)  . metronidazole 100 mL/hr at 09/05/17 0600  . phenylephrine (NEO-SYNEPHRINE) Adult infusion 19 mcg/min (09/05/17 1015)  .  sodium bicarbonate (isotonic) infusion in sterile water 50 mL/hr at 09/05/17 0600   PRN Meds:.sodium chloride, fentaNYL, midazolam, midazolam   ASSESMENT:   *   Colitis, likely ischemic.  Stool PCR panel priorly negative. Abdominal films this morning reassuring as there is showing resolution of small bowel dilatation.  *    Sepsis.  CAP.   Improved appearance on chest x-ray this morning.  Antibiotics include Rocephin, metronidazole, azithromycin.  *     AMS.  Old, small, thalamic infarct, nothing acute on head CT  *     Hypokalemia  *     AI, improved.  *       Alcohol abuse.  *     Minor anemia, macrocytic.  Suspect some of the 4+ grams decline in hemoglobin is due to aggressive hydration.   PLAN   *     Question to we need to continue current antibiotics for the colitis?.  Might be able to discontinue the  metronidazole but other antibiotics need continuation because of pneumonia.  *     Continue supportive care.  If she is going to be on vent for more than 24 hours, could consider starting low rate tube feeds and advancing as tolerated.  Suspect she is not going to be on the vent for that long so keep her n.p.o. for now.    Azucena Freed  09/05/2017, 10:57 AM Phone [phone]  Agree with Ms. Sandi Carne assessment and plan. I stopped metronidazole Gatha Mayer, MD, [facility]

## 2017-09-05 NOTE — Progress Notes (Signed)
[facility]/Trauma Progress Note      Assessment/Plan Hx of CVA HTN Depression/anxiety AKI - Cr 1.96 this AM, continue rehydration with IVF Acute metabolic encephalopathy Acute hypoxic respiratory failure - CXR with concern for multifocal pneumonia vs ARDS, on high flow nasal cannula, high risk for intubation  EtOH abuse - CIWA protocol Tobacco abuse  Severe sepsis 2/2 colitis - concerned this could be ischemic in nature, but lactic acid and WBC are WNL  - CT 6/23: wall thickening of descending and proximal sigmoid colon - abd xray 06/25 showed resolution of small bowel dilitation - afebrile, not tachy, requiring pressors, intubated last night due to resp failure - cont serial abdominal exams and NPO for now - if kidney function improves consider CTA abd/pelvis to r/o embolic or thrombotic cause - if patient acutely worsens or becomes hemodynamically unstable may require operative intervention Hypokalemia - K 3.2 this AM, goal of 4.0 in the setting of ileus, replenish per CCM  FEN: NPO, IVF VTE: SCDs, SQ heparin  ID: zithromax 6/23>>, flagyl 6/23>>, rocephin 6/24>>  Plan: Hold on TF at this time. Abd exam improved. We will follow.    LOS: 2 days    Subjective: CC; colitis   Intubated and sedated  Objective: Vital signs in last 24 hours: Temp:  [98.2 F (36.8 C)-99.6 F (37.6 C)] 98.2 F (36.8 C) (06/25 0748) Pulse Rate:  [85-125] 85 (06/25 0600) Resp:  [15-39] 18 (06/25 0600) BP: (61-183)/(35-87) 108/66 (06/25 0600) SpO2:  [63 %-100 %] 95 % (06/25 0751) FiO2 (%):  [70 %-100 %] 70 % (06/25 0751) Weight:  [58.6 kg (129 lb 3 oz)] 58.6 kg (129 lb 3 oz) (06/25 0500) Last BM Date: 09/04/17  Intake/Output from previous day: 06/24 0701 - 06/25 0700 In: 1610 [I.V.:3320.9; IV Piggyback:1741.1] Out: 1700 [Urine:1700] Intake/Output this shift: No intake/output data recorded.  PE: Gen:  Intubated and sedated Card:  RRR Pulm:  On vent Abd: Soft, mild  distention, +BS Skin: no rashes noted, warm and dry   Anti-infectives: Anti-infectives (From admission, onward)   Start     Dose/Rate Route Frequency Ordered Stop   09/04/17 1600  cefTRIAXone (ROCEPHIN) 1 g in sodium chloride 0.9 % 100 mL IVPB     1 g 200 mL/hr over 30 Minutes Intravenous Every 24 hours 09/03/17 2102 09/11/17 1559   09/04/17 1600  azithromycin (ZITHROMAX) 500 mg in sodium chloride 0.9 % 250 mL IVPB     500 mg 250 mL/hr over 60 Minutes Intravenous Every 24 hours 09/03/17 2102 09/11/17 1559   09/03/17 2330  valACYclovir (VALTREX) tablet 500 mg  Status:  Discontinued     500 mg Oral 2 times daily 09/03/17 2238 09/04/17 0140   09/03/17 2200  metroNIDAZOLE (FLAGYL) IVPB 500 mg     500 mg 100 mL/hr over 60 Minutes Intravenous Every 8 hours 09/03/17 2102     09/03/17 1500  cefTRIAXone (ROCEPHIN) 1 g in sodium chloride 0.9 % 100 mL IVPB     1 g 200 mL/hr over 30 Minutes Intravenous  Once 09/03/17 1450 09/03/17 1559   09/03/17 1500  azithromycin (ZITHROMAX) 500 mg in sodium chloride 0.9 % 250 mL IVPB     500 mg 250 mL/hr over 60 Minutes Intravenous  Once 09/03/17 1450 09/03/17 1657      Lab Results:  Recent Labs    09/04/17 0241 09/05/17 0241  WBC 1.9* 7.2  HGB 11.4* 11.0*  HCT 35.5* 34.7*  PLT 179 177   BMET  Recent Labs    09/04/17 1701 09/05/17 0241  NA 144 145  K 4.2 3.2*  CL 120* 119*  CO2 13* 21*  GLUCOSE 81 95  BUN 41* 35*  CREATININE 1.36* 1.21*  CALCIUM 7.8* 7.3*   PT/INR No results for input(s): LABPROT, INR in the last 72 hours. CMP     Component Value Date/Time   NA 145 09/05/2017 0241   K 3.2 (L) 09/05/2017 0241   CL 119 (H) 09/05/2017 0241   CO2 21 (L) 09/05/2017 0241   GLUCOSE 95 09/05/2017 0241   BUN 35 (H) 09/05/2017 0241   CREATININE 1.21 (H) 09/05/2017 0241   CALCIUM 7.3 (L) 09/05/2017 0241   PROT 4.3 (L) 09/05/2017 0241   ALBUMIN 1.8 (L) 09/05/2017 0241   AST 47 (H) 09/05/2017 0241   ALT 20 09/05/2017 0241   ALKPHOS 168  (H) 09/05/2017 0241   BILITOT 0.6 09/05/2017 0241   GFRNONAA 48 (L) 09/05/2017 0241   GFRAA 56 (L) 09/05/2017 0241   Lipase     Component Value Date/Time   LIPASE 26 09/03/2017 1631    Studies/Results: Ct Abdomen Pelvis Wo Contrast  Result Date: 09/03/2017 CLINICAL DATA:  Generalized abdominal pain, diarrhea. EXAM: CT ABDOMEN AND PELVIS WITHOUT CONTRAST TECHNIQUE: Multidetector CT imaging of the abdomen and pelvis was performed following the standard protocol without IV contrast. COMPARISON:  CT scan of February 20, 2017. FINDINGS: Lower chest: Mild bilateral posterior basilar subsegmental atelectasis is noted. Hepatobiliary: No focal liver abnormality is seen. No gallstones, gallbladder wall thickening, or biliary dilatation. Pancreas: Unremarkable. No pancreatic ductal dilatation or surrounding inflammatory changes. Spleen: Normal in size without focal abnormality. Adrenals/Urinary Tract: Adrenal glands are unremarkable. Bilateral nonobstructive nephrolithiasis is noted. No hydronephrosis or renal obstruction is noted. No ureteral calculi are noted. Urinary bladder is decompressed. Stomach/Bowel: The stomach appears normal. The appendix appears normal. Stool is noted throughout the colon. Wall thickening of descending and proximal sigmoid colon is noted consistent with infectious or inflammatory colitis. Vascular/Lymphatic: Aortic atherosclerosis. No enlarged abdominal or pelvic lymph nodes. Reproductive: Uterus and bilateral adnexa are unremarkable. Other: Small amount of free fluid is noted in the pelvis. No hernia is noted. Musculoskeletal: No acute or significant osseous findings. IMPRESSION: Wall thickening of descending and proximal sigmoid colon is noted consistent with infectious or inflammatory colitis. Bilateral nonobstructive nephrolithiasis. Mild bilateral posterior basilar subsegmental atelectasis. Aortic Atherosclerosis (ICD10-I70.0). Electronically Signed   By: Lupita RaiderJames  Green Jr, M.D.    On: 09/03/2017 18:25   Ct Head Wo Contrast  Result Date: 09/04/2017 CLINICAL DATA:  62 year old female with altered mental status. EXAM: CT HEAD WITHOUT CONTRAST TECHNIQUE: Contiguous axial images were obtained from the base of the skull through the vertex without intravenous contrast. COMPARISON:  Head CT dated 02/20/2017 FINDINGS: Brain: The ventricles and sulci appropriate size for patient's age. Small old right thalamic infarct again noted. There is no acute intracranial hemorrhage. No mass effect or midline shift. No extra-axial fluid collection. Vascular: No hyperdense vessel or unexpected calcification. Skull: Normal. Negative for fracture or focal lesion. Sinuses/Orbits: No acute finding. Other: None IMPRESSION: 1. No acute intracranial pathology. 2. 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 Electronically Signed   By: Elgie CollardArash  Radparvar M.D.   On: 09/04/2017 02:20   Dg Chest Port 1 View  Result Date: 09/05/2017 CLINICAL DATA:  Follow-up pneumonia EXAM: PORTABLE CHEST 1 VIEW COMPARISON:  09/04/2017 FINDINGS: Endotracheal tube and nasogastric catheter are again seen in satisfactory position. The lungs are again hypoinflated. Some improved aeration  is noted in the left lung although increasing patchy infiltrates are noted within the right lung. Portion of this may be related increasing pulmonary edema. No sizable effusion is noted at this time. No bony abnormality is seen. IMPRESSION: Improved aeration on the left with increased infiltrative changes on the right. Electronically Signed   By: Alcide Clever M.D.   On: 09/05/2017 07:31   Dg Chest Port 1 View  Result Date: 09/04/2017 CLINICAL DATA:  ARDS.  Hypertension. EXAM: PORTABLE CHEST 1 VIEW COMPARISON:  Portable film earlier in the day. FINDINGS: Patient has been intubated. ETT tip 2.7 cm above carina. Low lung volumes. Normal heart size. Worsening aeration with increasing both patchy and confluent consistent with the diagnosis of ARDS. Small RIGHT  effusion. Nasogastric tube in the stomach. IMPRESSION: Worsening aeration.  Satisfactory ETT placement. Electronically Signed   By: Elsie Stain M.D.   On: 09/04/2017 19:41   Dg Chest Port 1 View  Result Date: 09/04/2017 CLINICAL DATA:  Sepsis. EXAM: PORTABLE CHEST 1 VIEW COMPARISON:  Chest x-ray from yesterday. FINDINGS: The heart size and mediastinal contours are within normal limits. Normal pulmonary vascularity. Low lung volumes with new patchy consolidation in the left upper and lower lobes, as well as the right mid and infrahilar lung. No pleural effusion or pneumothorax. No acute osseous abnormality. IMPRESSION: 1. New left-greater-than-right airspace disease, concerning for multifocal pneumonia or ARDS. Electronically Signed   By: Obie Dredge M.D.   On: 09/04/2017 11:22   Dg Chest Portable 1 View  Result Date: 09/03/2017 CLINICAL DATA:  Fever. EXAM: PORTABLE CHEST 1 VIEW COMPARISON:  Radiographs of May 24, 2017. FINDINGS: The heart size and mediastinal contours are within normal limits. No pneumothorax or pleural effusion is noted. Right lung is clear. Minimal left basilar atelectasis or infiltrate is noted. The visualized skeletal structures are unremarkable. IMPRESSION: Minimal left basilar atelectasis or infiltrate is noted. Electronically Signed   By: Lupita Raider, M.D.   On: 09/03/2017 14:11   Dg Abd Portable 1v  Result Date: 09/05/2017 CLINICAL DATA:  Abdominal pain EXAM: PORTABLE ABDOMEN - 1 VIEW COMPARISON:  09/04/2017 FINDINGS: Scattered large and small bowel gas is noted. The previously seen dilated loops of small bowel are not appreciated on this exam. No free air is seen. Nasogastric catheter is coiled within the stomach. No bony abnormality is noted. IMPRESSION: Resolution of small bowel dilatation. Nasogastric catheter within the stomach. Electronically Signed   By: Alcide Clever M.D.   On: 09/05/2017 07:30   Dg Abd Portable 1v  Result Date: 09/04/2017 CLINICAL DATA:   Severe abdominal pain. Abdominal distention. Recent diagnosis of colitis. EXAM: PORTABLE ABDOMEN - 1 VIEW COMPARISON:  CT abdomen and pelvis 09/03/2017. FINDINGS: Mildly dilated loops of small bowel are present throughout the abdomen. There is gas within the colon. Thickened colonic wall is again noted. Obstruction or free air is present. Right greater than left lower lobe airspace disease is present. IMPRESSION: 1. Mildly dilated loops of small bowel throughout the abdomen with gas in the colon. Findings are compatible with adynamic ileus. 2. Thickening of the colonic wall compatible with colitis. 3. Bibasilar airspace disease compatible with multi lobar pneumonia versus ARDS. Electronically Signed   By: Marin Roberts M.D.   On: 09/04/2017 13:10   Dg Abd Portable 1 View  Result Date: 09/03/2017 CLINICAL DATA:  Acute generalized abdominal pain, diarrhea. EXAM: PORTABLE ABDOMEN - 1 VIEW COMPARISON:  Radiograph of Jul 25, 2017. FINDINGS: The bowel gas pattern is  normal. No radio-opaque calculi or other significant radiographic abnormality are seen. IMPRESSION: No evidence of bowel obstruction or ileus. Electronically Signed   By: Lupita Raider, M.D.   On: 09/03/2017 14:09      Jerre Simon , [facility] 09/05/2017, 9:20 AM  Pager: [phone] Mon-Wed, Friday 7:00am-4:30pm Thurs 7am-11:30am  Consults: [phone]

## 2017-09-06 ENCOUNTER — Other Ambulatory Visit: Payer: Self-pay

## 2017-09-06 DIAGNOSIS — K55039 Acute (reversible) ischemia of large intestine, extent unspecified: Secondary | ICD-10-CM

## 2017-09-06 DIAGNOSIS — R4182 Altered mental status, unspecified: Secondary | ICD-10-CM

## 2017-09-06 DIAGNOSIS — E46 Unspecified protein-calorie malnutrition: Secondary | ICD-10-CM

## 2017-09-06 LAB — PROCALCITONIN: Procalcitonin: 4.85 ng/mL

## 2017-09-06 LAB — BLOOD GAS, ARTERIAL
Acid-Base Excess: 1.4 mmol/L (ref 0.0–2.0)
Bicarbonate: 25.7 mmol/L (ref 20.0–28.0)
Drawn by: 535271
FIO2: 60
MECHVT: 380 mL
O2 Saturation: 94.4 %
PEEP: 10 cmH2O
Patient temperature: 98.6
RATE: 30 resp/min
pCO2 arterial: 42.7 mmHg (ref 32.0–48.0)
pH, Arterial: 7.397 (ref 7.350–7.450)
pO2, Arterial: 72.5 mmHg — ABNORMAL LOW (ref 83.0–108.0)

## 2017-09-06 LAB — CBC
HCT: 33.9 % — ABNORMAL LOW (ref 36.0–46.0)
Hemoglobin: 11.1 g/dL — ABNORMAL LOW (ref 12.0–15.0)
MCH: 31.7 pg (ref 26.0–34.0)
MCHC: 32.7 g/dL (ref 30.0–36.0)
MCV: 96.9 fL (ref 78.0–100.0)
Platelets: 171 10*3/uL (ref 150–400)
RBC: 3.5 MIL/uL — ABNORMAL LOW (ref 3.87–5.11)
RDW: 14.9 % (ref 11.5–15.5)
WBC: 10 10*3/uL (ref 4.0–10.5)

## 2017-09-06 LAB — GLUCOSE, CAPILLARY
Glucose-Capillary: 108 mg/dL — ABNORMAL HIGH (ref 70–99)
Glucose-Capillary: 116 mg/dL — ABNORMAL HIGH (ref 70–99)
Glucose-Capillary: 120 mg/dL — ABNORMAL HIGH (ref 70–99)
Glucose-Capillary: 135 mg/dL — ABNORMAL HIGH (ref 70–99)
Glucose-Capillary: 140 mg/dL — ABNORMAL HIGH (ref 70–99)
Glucose-Capillary: 93 mg/dL (ref 70–99)

## 2017-09-06 LAB — RENAL FUNCTION PANEL
Albumin: 1.7 g/dL — ABNORMAL LOW (ref 3.5–5.0)
Anion gap: 9 (ref 5–15)
BUN: 25 mg/dL — ABNORMAL HIGH (ref 6–20)
CO2: 24 mmol/L (ref 22–32)
Calcium: 7.7 mg/dL — ABNORMAL LOW (ref 8.9–10.3)
Chloride: 111 mmol/L (ref 98–111)
Creatinine, Ser: 0.98 mg/dL (ref 0.44–1.00)
GFR calc Af Amer: >60 mL/min (ref >60)
GFR calc non Af Amer: >60 mL/min (ref >60)
Glucose, Bld: 98 mg/dL (ref 70–99)
Phosphorus: 1 mg/dL — CL (ref 2.5–4.6)
Potassium: 3 mmol/L — ABNORMAL LOW (ref 3.5–5.1)
Sodium: 144 mmol/L (ref 135–145)

## 2017-09-06 LAB — POCT I-STAT 3, ART BLOOD GAS (G3+)
Acid-base deficit: 4 mmol/L — ABNORMAL HIGH (ref 0.0–2.0)
Bicarbonate: 20.7 mmol/L (ref 20.0–28.0)
O2 Saturation: 89 %
Patient temperature: 97.7
TCO2: 22 mmol/L (ref 22–32)
pCO2 arterial: 35.4 mmHg (ref 32.0–48.0)
pH, Arterial: 7.372 (ref 7.350–7.450)
pO2, Arterial: 56 mmHg — ABNORMAL LOW (ref 83.0–108.0)

## 2017-09-06 LAB — MAGNESIUM: Magnesium: 1.9 mg/dL (ref 1.7–2.4)

## 2017-09-06 LAB — BASIC METABOLIC PANEL
Anion gap: 9 (ref 5–15)
BUN: 25 mg/dL — ABNORMAL HIGH (ref 6–20)
CO2: 24 mmol/L (ref 22–32)
Calcium: 7.7 mg/dL — ABNORMAL LOW (ref 8.9–10.3)
Chloride: 112 mmol/L — ABNORMAL HIGH (ref 98–111)
Creatinine, Ser: 0.99 mg/dL (ref 0.44–1.00)
GFR calc Af Amer: >60 mL/min (ref >60)
GFR calc non Af Amer: >60 mL/min (ref >60)
Glucose, Bld: 97 mg/dL (ref 70–99)
Potassium: 3 mmol/L — ABNORMAL LOW (ref 3.5–5.1)
Sodium: 145 mmol/L (ref 135–145)

## 2017-09-06 MED ORDER — IPRATROPIUM-ALBUTEROL 0.5-2.5 (3) MG/3ML IN SOLN
3.0000 mL | Freq: Four times a day (QID) | RESPIRATORY_TRACT | Status: DC
  Administered 2017-09-06 – 2017-09-14 (×32): 3 mL via RESPIRATORY_TRACT
  Filled 2017-09-06 (×33): qty 3

## 2017-09-06 MED ORDER — POTASSIUM PHOSPHATES 15 MMOLE/5ML IV SOLN
30.0000 mmol | Freq: Once | INTRAVENOUS | Status: AC
  Administered 2017-09-06: 30 mmol via INTRAVENOUS
  Filled 2017-09-06: qty 10

## 2017-09-06 MED ORDER — VITAMIN B-1 100 MG PO TABS
100.0000 mg | ORAL_TABLET | Freq: Every day | ORAL | Status: DC
Start: 2017-09-06 — End: 2017-09-11
  Administered 2017-09-06 – 2017-09-11 (×6): 100 mg
  Filled 2017-09-06 (×6): qty 1

## 2017-09-06 MED ORDER — FAMOTIDINE 40 MG/5ML PO SUSR
20.0000 mg | Freq: Two times a day (BID) | ORAL | Status: DC
  Administered 2017-09-06 – 2017-09-11 (×11): 20 mg
  Filled 2017-09-06 (×11): qty 2.5

## 2017-09-06 MED ORDER — VITAL AF 1.2 CAL PO LIQD
1000.0000 mL | ORAL | Status: DC
  Administered 2017-09-06 – 2017-09-08 (×2): 1000 mL

## 2017-09-06 MED ORDER — FOLIC ACID 1 MG PO TABS
1.0000 mg | ORAL_TABLET | Freq: Every day | ORAL | Status: DC
  Administered 2017-09-06 – 2017-09-11 (×6): 1 mg
  Filled 2017-09-06 (×6): qty 1

## 2017-09-06 MED ORDER — ALBUTEROL SULFATE (2.5 MG/3ML) 0.083% IN NEBU: 2.5000 mg | INHALATION_SOLUTION | RESPIRATORY_TRACT | Status: DC | PRN

## 2017-09-06 MED ORDER — LACTATED RINGERS IV SOLN
INTRAVENOUS | Status: DC
  Administered 2017-09-06 – 2017-09-07 (×2): via INTRAVENOUS

## 2017-09-06 NOTE — Progress Notes (Signed)
CRITICAL VALUE ALERT  Critical Value:  Phosphorus 1.0  Date & Time Notied:  09/06/2017 0536  Provider Notified: Pola CornELINK  Orders Received/Actions taken: Awaiting orders

## 2017-09-06 NOTE — Progress Notes (Signed)
eLink Physician-Brief Progress Note Patient Name: Vonzell SchlatterBonnie M Guerreiro DOB: 1956/02/26 MRN: [id number]   Date of Service  09/06/2017  HPI/Events of Note    eICU Interventions          Migdalia DkOkoronkwo U Trivia Heffelfinger 09/06/2017, 10:39 PM

## 2017-09-06 NOTE — Progress Notes (Signed)
Subjective/Chief Complaint: Patient remains intubated, sedated, arousable Off all pressors Acidosis improved No BM recorded yesterday  Objective: Vital signs in last 24 hours: Temp:  [97.5 F (36.4 C)-99.6 F (37.6 C)] 97.5 F (36.4 C) (06/26 0731) Pulse Rate:  [60-101] 64 (06/26 0745) Resp:  [18-33] 30 (06/26 0745) BP: (68-150)/(49-97) 139/84 (06/26 0745) SpO2:  [86 %-100 %] 97 % (06/26 0745) FiO2 (%):  [40 %-60 %] 60 % (06/26 0400) Weight:  [60.3 kg (132 lb 15 oz)] 60.3 kg (132 lb 15 oz) (06/26 0500) Last BM Date: 09/04/17  Intake/Output from previous day: 06/25 0701 - 06/26 0700 In: 4382.6 [I.V.:3717.8; IV Piggyback:664.9] Out: 1205 [Urine:1180; Emesis/NG output:25] Intake/Output this shift: Total I/O In: 354 [I.V.:230.1; IV Piggyback:123.9] Out: 20 [Urine:20]  General appearance: intubated and sedated; arousable to voice and stimulation GI: soft, no signs of tenderness when palpating the abdomen  Lab Results:  Recent Labs    09/05/17 0241 09/06/17 0427  WBC 7.2 10.0  HGB 11.0* 11.1*  HCT 34.7* 33.9*  PLT 177 171   BMET Recent Labs    09/05/17 0241 09/06/17 0427  NA 145 144  145  K 3.2* 3.0*  3.0*  CL 119* 111  112*  CO2 21* 24  24  GLUCOSE 95 98  97  BUN 35* 25*  25*  CREATININE 1.21* 0.98  0.99  CALCIUM 7.3* 7.7*  7.7*   PT/INR No results for input(s): LABPROT, INR in the last 72 hours. ABG Recent Labs    09/05/17 0744 09/06/17 0404  PHART 7.296* 7.372  HCO3 18.4* 20.7    Studies/Results: Dg Chest Port 1 View  Result Date: 09/05/2017 CLINICAL DATA:  Follow-up pneumonia EXAM: PORTABLE CHEST 1 VIEW COMPARISON:  09/04/2017 FINDINGS: Endotracheal tube and nasogastric catheter are again seen in satisfactory position. The lungs are again hypoinflated. Some improved aeration is noted in the left lung although increasing patchy infiltrates are noted within the right lung. Portion of this may be related increasing pulmonary edema. No  sizable effusion is noted at this time. No bony abnormality is seen. IMPRESSION: Improved aeration on the left with increased infiltrative changes on the right. Electronically Signed   By: Alcide Clever M.D.   On: 09/05/2017 07:31   Dg Chest Port 1 View  Result Date: 09/04/2017 CLINICAL DATA:  ARDS.  Hypertension. EXAM: PORTABLE CHEST 1 VIEW COMPARISON:  Portable film earlier in the day. FINDINGS: Patient has been intubated. ETT tip 2.7 cm above carina. Low lung volumes. Normal heart size. Worsening aeration with increasing both patchy and confluent consistent with the diagnosis of ARDS. Small RIGHT effusion. Nasogastric tube in the stomach. IMPRESSION: Worsening aeration.  Satisfactory ETT placement. Electronically Signed   By: Elsie Stain M.D.   On: 09/04/2017 19:41   Dg Chest Port 1 View  Result Date: 09/04/2017 CLINICAL DATA:  Sepsis. EXAM: PORTABLE CHEST 1 VIEW COMPARISON:  Chest x-ray from yesterday. FINDINGS: The heart size and mediastinal contours are within normal limits. Normal pulmonary vascularity. Low lung volumes with new patchy consolidation in the left upper and lower lobes, as well as the right mid and infrahilar lung. No pleural effusion or pneumothorax. No acute osseous abnormality. IMPRESSION: 1. New left-greater-than-right airspace disease, concerning for multifocal pneumonia or ARDS. Electronically Signed   By: Obie Dredge M.D.   On: 09/04/2017 11:22   Dg Abd Portable 1v  Result Date: 09/05/2017 CLINICAL DATA:  Abdominal pain EXAM: PORTABLE ABDOMEN - 1 VIEW COMPARISON:  09/04/2017 FINDINGS: Scattered large and  small bowel gas is noted. The previously seen dilated loops of small bowel are not appreciated on this exam. No free air is seen. Nasogastric catheter is coiled within the stomach. No bony abnormality is noted. IMPRESSION: Resolution of small bowel dilatation. Nasogastric catheter within the stomach. Electronically Signed   By: Alcide CleverMark  Lukens M.D.   On: 09/05/2017 07:30    Dg Abd Portable 1v  Result Date: 09/04/2017 CLINICAL DATA:  Severe abdominal pain. Abdominal distention. Recent diagnosis of colitis. EXAM: PORTABLE ABDOMEN - 1 VIEW COMPARISON:  CT abdomen and pelvis 09/03/2017. FINDINGS: Mildly dilated loops of small bowel are present throughout the abdomen. There is gas within the colon. Thickened colonic wall is again noted. Obstruction or free air is present. Right greater than left lower lobe airspace disease is present. IMPRESSION: 1. Mildly dilated loops of small bowel throughout the abdomen with gas in the colon. Findings are compatible with adynamic ileus. 2. Thickening of the colonic wall compatible with colitis. 3. Bibasilar airspace disease compatible with multi lobar pneumonia versus ARDS. Electronically Signed   By: Marin Robertshristopher  Mattern M.D.   On: 09/04/2017 13:10    Anti-infectives: Anti-infectives (From admission, onward)   Start     Dose/Rate Route Frequency Ordered Stop   09/04/17 1600  cefTRIAXone (ROCEPHIN) 1 g in sodium chloride 0.9 % 100 mL IVPB     1 g 200 mL/hr over 30 Minutes Intravenous Every 24 hours 09/03/17 2102 09/11/17 1559   09/04/17 1600  azithromycin (ZITHROMAX) 500 mg in sodium chloride 0.9 % 250 mL IVPB     500 mg 250 mL/hr over 60 Minutes Intravenous Every 24 hours 09/03/17 2102 09/11/17 1559   09/03/17 2330  valACYclovir (VALTREX) tablet 500 mg  Status:  Discontinued     500 mg Oral 2 times daily 09/03/17 2238 09/04/17 0140   09/03/17 2200  metroNIDAZOLE (FLAGYL) IVPB 500 mg  Status:  Discontinued     500 mg 100 mL/hr over 60 Minutes Intravenous Every 8 hours 09/03/17 2102 09/05/17 1858   09/03/17 1500  cefTRIAXone (ROCEPHIN) 1 g in sodium chloride 0.9 % 100 mL IVPB     1 g 200 mL/hr over 30 Minutes Intravenous  Once 09/03/17 1450 09/03/17 1559   09/03/17 1500  azithromycin (ZITHROMAX) 500 mg in sodium chloride 0.9 % 250 mL IVPB     500 mg 250 mL/hr over 60 Minutes Intravenous  Once 09/03/17 1450 09/03/17 1657       Assessment/Plan: Hx of CVA HTN Depression/anxiety AKI - Cr 0.98 today Acute metabolic encephalopathy Acute hypoxic respiratory failure - CXR with concern for multifocal pneumonia vs ARDS EtOH abuse - CIWA protocol Tobacco abuse  Severe sepsis 2/2 colitis - concerned this could be ischemic in nature, but lactic acid and WBC are WNL - CT 6/23: wall thickening of descending and proximal sigmoid colon - abd xray 06/25 showed resolution of small bowel dilitation -afebrile, not tachy, no longerrequiring pressors,  - if patient acutely worsens or becomes hemodynamically unstable may require operative intervention Hypokalemia- K 3.2 this AM, goal of 4.0 in the setting of ileus, replenish per CCM  FEN: NPO, IVF VTE: SCDs, SQ heparin  ID: zithromax 6/23>>, flagyl 6/23>>, rocephin 6/24>>  Plan:  Abd exam improved. May begin trickle feeds per CCM.      LOS: 3 days    Carla LunaMatthew K Icey Tello 09/06/2017

## 2017-09-06 NOTE — Progress Notes (Addendum)
Daily Rounding Note  09/06/2017, 1:08 PM  LOS: 3 days   SUBJECTIVE:   Remains intubated on vent with no plans for vent wean today.  Trickle rate tube feeds started at 20 mL's per hour, so far she is tolerating.  Got a dose of MiraLAX yesterday and again today.  Has not yet had a bowel movement. RN tells me either the patient is sleeping or she is agitated.  She is receiving fentanyl and Precedex.  She is however able to follow commands and express her displeasure at the ETT  OBJECTIVE:         Vital signs in last 24 hours:    Temp:  [97.5 F (36.4 C)-99.4 F (37.4 C)] 97.5 F (36.4 C) (06/26 1137) Pulse Rate:  [60-101] 61 (06/26 1215) Resp:  [18-33] 30 (06/26 1215) BP: (88-150)/(50-97) 131/81 (06/26 1215) SpO2:  [86 %-100 %] 98 % (06/26 1215) FiO2 (%):  [40 %-60 %] 60 % (06/26 1133) Weight:  [132 lb 15 oz (60.3 kg)] 132 lb 15 oz (60.3 kg) (06/26 0500) Last BM Date: 09/04/17 Filed Weights   09/04/17 0332 09/05/17 0500 09/06/17 0500  Weight: 124 lb 1.9 oz (56.3 kg) 129 lb 3 oz (58.6 kg) 132 lb 15 oz (60.3 kg)   General: Alert, somewhat agitated.  Remains intubated Heart: RRR.  No MRG.  S1, S2 present. Chest: Rales bilaterally.  Some dyssynchronous breathing with vent which is alarming. Abdomen: Soft.  Slight distention.  Not tender.  Bowel sounds present and normal but hypoactive.  No tinkling or tympanitic bowel sounds. Extremities: No CCE. Neuro/Psych: Able to nod appropriately to yes/no questions.  Moves all 4 limbs when requested.  Agitated at times. *   Musculoskeletal.  Her neck is markedly canting to the right.  She does not want to move it to the left which would put the head neck in a central position.  When the nurse and I try to straighten out her neck she became agitated.  Intake/Output from previous day: 06/25 0701 - 06/26 0700 In: 4382.6 [I.V.:3717.8; IV Piggyback:664.9] Out: 1205 [Urine:1180; Emesis/NG  output:25]  Intake/Output this shift: Total I/O In: 1188.3 [I.V.:534.5; NG/GT:190; IV Piggyback:463.8] Out: 120 [Urine:70; Emesis/NG output:50]  Lab Results: Recent Labs    09/04/17 0241 09/05/17 0241 09/06/17 0427  WBC 1.9* 7.2 10.0  HGB 11.4* 11.0* 11.1*  HCT 35.5* 34.7* 33.9*  PLT 179 177 171   BMET Recent Labs    09/04/17 1701 09/05/17 0241 09/06/17 0427  NA 144 145 144  145  K 4.2 3.2* 3.0*  3.0*  CL 120* 119* 111  112*  CO2 13* 21* 24  24  GLUCOSE 81 95 98  97  BUN 41* 35* 25*  25*  CREATININE 1.36* 1.21* 0.98  0.99  CALCIUM 7.8* 7.3* 7.7*  7.7*      ASSESMENT:   *   Colitis, likely ischemic.  Ileus resolved.  Tolerating low rate tube feeds.  Last BM soft, brown on 6/24.  *    CAP, sepsis.  A few staph aureus seen on tracheal aspirate.   Negative blood and urine cultures.  Acidosis improved.  Off pressors.  Remains on vent.  *    AMS.  *    AKI, improved.  Hypokalemia persists.  *     Protein calorie malnutrition.  Low albumin 1.7.  *    Macrocytosis, mild anemia.   PLAN   *   Check B12  and folate levels in the morning.  *   Advance tube feeds to goal rate.  Vital AF 1.2 goal rate 50 mL/hour     Jennye Moccasin  09/06/2017, 1:08 PM Phone [phone]  Agree with Ms. Heron Nay assessment and plan. I have evaluated patient in person also.  We have no more specific recs and will sign off. No need for outpatient GI appt at dc as best I can tell now. Call back prn  Iva Boop, MD, [facility]

## 2017-09-06 NOTE — Progress Notes (Signed)
Decreased urine output noted 20-30 cc every 2 hours noted.Joneen RoachPaul Hoffman NP and Dr Denese KillingsAgarwala informed

## 2017-09-06 NOTE — Progress Notes (Addendum)
[id number] and Bicarbonate 25.7. Bicarbonate infusion discontinued per orders

## 2017-09-06 NOTE — Progress Notes (Signed)
eLink Physician-Brief Progress Note Patient Name: Vonzell SchlatterBonnie M Rhude DOB: Sep 23, 1955 MRN: [id number]   Date of Service  09/06/2017  HPI/Events of Note  Hypothermia Agitation due to ETOH withdrawal  eICU Interventions  Warming blanket Soft restraints + mittens to prevent self-extubation.        Thomasene Lotkoronkwo U Areeb Corron 09/06/2017, 9:24 PM

## 2017-09-06 NOTE — Progress Notes (Signed)
.. ..  Name: Carla Bowen MRN: [id number] DOB: 1956-03-03    ADMISSION DATE:  09/03/2017 CONSULTATION DATE:  09/04/2017  REFERRING MD :  Pearson Grippe MD  CHIEF COMPLAINT:  Abdominal pain. S/p fall. Hypotension  BRIEF PATIENT DESCRIPTION:  62 yr old female with PMHx Anxiety, Depression, Fibromyalgia, High cholesterol, Hypertension, and CVA p/w abdominal pain, febrile and hypotensive. Received 3.5L IVF and remains hypotensive PCCM consulted for admission  SUBJECTIVE:  Remains sedated on vent.  Off pressors  VITAL SIGNS: Temp:  [97.5 F (36.4 C)-99.6 F (37.6 C)] 97.5 F (36.4 C) (06/26 0731) Pulse Rate:  [64-101] 64 (06/26 0500) Resp:  [18-33] 30 (06/26 0600) BP: (68-150)/(49-91) 146/89 (06/26 0600) SpO2:  [86 %-100 %] 97 % (06/26 0500) FiO2 (%):  [40 %-70 %] 60 % (06/26 0400) Weight:  [60.3 kg (132 lb 15 oz)] 60.3 kg (132 lb 15 oz) (06/26 0500)  PHYSICAL EXAMINATION: General:  Acutely ill appearing female in NAD HEENT: [place]/AT, PERRL, no JVD Neuro: Sedated RASS -2. Does intermittently wake up agitated but purposeful.  CV:  RRR, no MRG PULM: even/non-labored on MV, breathing at set rate of 30, lungs bilaterally rales, Minimal secretions GI: Still some distension, Hypoactive bowel sounds.  Extremities: cool/dry, no edema  Skin: no rashes    Recent Labs  Lab 09/04/17 1701 09/05/17 0241 09/06/17 0427  NA 144 145 144  145  K 4.2 3.2* 3.0*  3.0*  CL 120* 119* 111  112*  CO2 13* 21* 24  24  BUN 41* 35* 25*  25*  CREATININE 1.36* 1.21* 0.98  0.99  GLUCOSE 81 95 98  97   Recent Labs  Lab 09/04/17 0241 09/05/17 0241 09/06/17 0427  HGB 11.4* 11.0* 11.1*  HCT 35.5* 34.7* 33.9*  WBC 1.9* 7.2 10.0  PLT 179 177 171    SIGNIFICANT EVENTS  6/23 admitted overnight/ hypotensive/ abd pain 6/24 worsening hypoxia -intubated that afternoon 6/26 > off pressors. Remains on vent. Imaging improving.   STUDIES:  6/23 CT Ab/Pelv: Wall thickening of descending and  proximal sigmoid colon is noted consistent with infectious or inflammatory colitis. Bilateral nonobstructive nephrolithiasis. Mild bilateral posterior basilar subsegmental atelectasis. 6/24 CXR >>New left-greater-than-right airspace disease, concerning for multifocal pneumonia or ARDS 6/24  AXR >> 1. Mildly dilated loops of small bowel throughout the abdomen with gas in the colon. Findings are compatible with adynamic ileus. 2. Thickening of the colonic wall compatible with colitis. 3. Bibasilar airspace disease compatible with multi lobar pneumonia versus ARDS 6/25 AXR >> Resolution of small bowel dilatation. Nasogastric catheter within the stomach. 6/24 TTE >>  Left ventricle: The cavity size was normal. Systolic function was   normal. The estimated ejection fraction was in the range of 55%   to 60%. Although no diagnostic regional wall motion abnormality  was identified, this possibility cannot be completely excluded on the basis of this study. Left ventricular diastolic function parameters were normal.  Cultures:  6/23 BC x 2 >> ngtd >> 6/23 GI panel PCR >> neg 6/24 MRSA PCR >> neg 6/24 Resp panel >> neg 6/24 UC >> neg 6/24 urine strep >> neg 6/24 urine legionella >> neg 6/25 trach aspirate >>  Antibiotics:  6/23 ceftriaxone >> 6/25 6/23 Flagyl >> 6/25 6/23 azithromycin >> 6/25 unasyn >>  Lines:  6/24 ETT >> 6/24 OGT >> 6/24 Foley >> PIV   ASSESSMENT / PLAN: NEURO: Acute metabolic encephalopathy - improved Concern for withdrawal h/o ETOH use- last ETOH use was 6/23 AM,  1/5 liquor/week  P:  PAD protocol w/ precedex and fentanyl gtt RASS goal -1 to -2 Daily WUA Frequent neuro checks Thiamine and Folate  Repeat ABG this afternoon.   CARDIAC Septic Shock > improved, off pressors 6/26.  P:  Tele monitoring Phenylephrine PRN to keep MAP > 65mmHg Continue with IVF > may need to back off a touch here. Will D/C LR continue with Bicarb.  Holding home lisinopril, rosuvastatin,  amlodipine  PULMONARY: Acute hypoxic respiratory failure  MPNA/ARDS - possible aspiration  P:  Full MV support PRVC 8 cc/kg, rate 30, PEEP 10, 100% > based on ABG 6/26 am need to keep these settings. Or perhaps take PEEP up some in order to get FiO2 down.  Trend CXR, will repeat 6/27 AM VAP measures See ID  ID: Severe Sepsis secondary to Colitis PNA -suspected Aspiration given hx vomiting P:  Trend PCT, WBC, fever curve Blood, sputum cultures pending Unasyn as above DC azithromycin as strep and legionella neg.  Endocrine: Hypoglycemia P:  CBG q 4 Add D5 to bicarb gtt/ adding TF   GI: Infectious and Inflammatory Colitis  Ileus - 6/24 Protein calorie malnutrition - AXR 6/25 improved- resolution of SB dilation, gastric tube in stomach  - LFTs stable P:  appreciate GI and surgery input Will start trickle feeds today.   Heme: Leukopenia - improved Thrombocytopenia - stable P:  Trend CBC  RENAL Acute Kidney Injury - improving Anion gap metabolic / lactic/ hyperchloremic acidosis Mild hypernatremia - resolved Hypokalemia/ hypomag - 6/25 lactate cleared, AKI improving, ongoing metabolic acidosis r/t hyperchloremia  P:  DC/LR Bicarb gtt at 50 ml/hr, (changed to be mixed with D5) Trend BMP /phos/ mag/ urinary output Replace electrolytes as indicated  Family: No family at bedside.   Joneen RoachPaul Johnnie Moten, AGACNP-BC [facility] Pager [phone] or [phone]  09/06/2017 8:16 AM

## 2017-09-06 NOTE — Progress Notes (Signed)
eLink Physician-Brief Progress Note Patient Name: Carla Bowen DOB: 07/17/1955 MRN: [id number]   Date of Service  09/06/2017  HPI/Events of Note  K+ = 3.0, PO4--- = 1.0 and Creatinine = 0.98.  eICU Interventions  Will replace K+ and PO4---.     Intervention Category Major Interventions: Electrolyte abnormality - evaluation and management  [name] 09/06/2017, 5:55 AM

## 2017-09-06 NOTE — Care Management Note (Signed)
Case Management Note  Patient Details  Name: Vonzell SchlatterBonnie M Vanderloop MRN: [id number] Date of Birth: 03/05/1956  Subjective/Objective:    Pt admitted with abdominal pain (likely ischemic colitis) and acute encephalopathy - possible ETOH withdrawl              Action/Plan:   PTA from home.  Pt is ventilated on continous sedation.  CM will continue to follow    Expected Discharge Date:  09/12/17               Expected Discharge Plan:     In-House Referral:  Clinical Social Work  Discharge planning Services  CM Consult  Post Acute Care Choice:    Choice offered to:     DME Arranged:    DME Agency:     HH Arranged:    HH Agency:     Status of Service:     If discussed at [employer] Meetings, dates discussed:    Additional Comments:  Cherylann ParrClaxton, Ricki Vanhandel S, RN 09/06/2017, 4:06 PM

## 2017-09-07 ENCOUNTER — Inpatient Hospital Stay (HOSPITAL_COMMUNITY): Payer: Medicare Other

## 2017-09-07 LAB — GLUCOSE, CAPILLARY
Glucose-Capillary: 127 mg/dL — ABNORMAL HIGH (ref 70–99)
Glucose-Capillary: 128 mg/dL — ABNORMAL HIGH (ref 70–99)
Glucose-Capillary: 134 mg/dL — ABNORMAL HIGH (ref 70–99)
Glucose-Capillary: 134 mg/dL — ABNORMAL HIGH (ref 70–99)
Glucose-Capillary: 153 mg/dL — ABNORMAL HIGH (ref 70–99)
Glucose-Capillary: 175 mg/dL — ABNORMAL HIGH (ref 70–99)

## 2017-09-07 LAB — BASIC METABOLIC PANEL
Anion gap: 7 (ref 5–15)
Anion gap: 10 (ref 5–15)
Anion gap: 9 (ref 5–15)
BUN: 22 mg/dL — ABNORMAL HIGH (ref 6–20)
BUN: 13 mg/dL (ref 6–20)
BUN: 10 mg/dL (ref 6–20)
CO2: 26 mmol/L (ref 22–32)
CO2: 27 mmol/L (ref 22–32)
CO2: 26 mmol/L (ref 22–32)
Calcium: 7 mg/dL — ABNORMAL LOW (ref 8.9–10.3)
Calcium: 7.4 mg/dL — ABNORMAL LOW (ref 8.9–10.3)
Calcium: 7.2 mg/dL — ABNORMAL LOW (ref 8.9–10.3)
Chloride: 108 mmol/L (ref 98–111)
Chloride: 106 mmol/L (ref 98–111)
Chloride: 107 mmol/L (ref 98–111)
Creatinine, Ser: 0.77 mg/dL (ref 0.44–1.00)
Creatinine, Ser: 0.81 mg/dL (ref 0.44–1.00)
Creatinine, Ser: 0.83 mg/dL (ref 0.44–1.00)
GFR calc Af Amer: >60 mL/min (ref >60)
GFR calc Af Amer: >60 mL/min (ref >60)
GFR calc Af Amer: >60 mL/min (ref >60)
GFR calc non Af Amer: >60 mL/min (ref >60)
GFR calc non Af Amer: >60 mL/min (ref >60)
GFR calc non Af Amer: >60 mL/min (ref >60)
Glucose, Bld: 148 mg/dL — ABNORMAL HIGH (ref 70–99)
Glucose, Bld: 178 mg/dL — ABNORMAL HIGH (ref 70–99)
Glucose, Bld: 117 mg/dL — ABNORMAL HIGH (ref 70–99)
Potassium: 2.7 mmol/L — CL (ref 3.5–5.1)
Potassium: 3.4 mmol/L — ABNORMAL LOW (ref 3.5–5.1)
Potassium: 3.9 mmol/L (ref 3.5–5.1)
Sodium: 141 mmol/L (ref 135–145)
Sodium: 143 mmol/L (ref 135–145)
Sodium: 142 mmol/L (ref 135–145)

## 2017-09-07 LAB — POCT I-STAT 3, ART BLOOD GAS (G3+)
Acid-Base Excess: 4 mmol/L — ABNORMAL HIGH (ref 0.0–2.0)
Bicarbonate: 27.7 mmol/L (ref 20.0–28.0)
O2 Saturation: 98 %
Patient temperature: 100
TCO2: 29 mmol/L (ref 22–32)
pCO2 arterial: 40.4 mmHg (ref 32.0–48.0)
pH, Arterial: 7.447 (ref 7.350–7.450)
pO2, Arterial: 100 mmHg (ref 83.0–108.0)

## 2017-09-07 LAB — CBC
HCT: 31.1 % — ABNORMAL LOW (ref 36.0–46.0)
Hemoglobin: 10.2 g/dL — ABNORMAL LOW (ref 12.0–15.0)
MCH: 31.6 pg (ref 26.0–34.0)
MCHC: 32.8 g/dL (ref 30.0–36.0)
MCV: 96.3 fL (ref 78.0–100.0)
Platelets: 170 10*3/uL (ref 150–400)
RBC: 3.23 MIL/uL — ABNORMAL LOW (ref 3.87–5.11)
RDW: 14.7 % (ref 11.5–15.5)
WBC: 9 10*3/uL (ref 4.0–10.5)

## 2017-09-07 LAB — CULTURE, RESPIRATORY W GRAM STAIN

## 2017-09-07 LAB — PHOSPHORUS: Phosphorus: 2 mg/dL — ABNORMAL LOW (ref 2.5–4.6)

## 2017-09-07 LAB — PROCALCITONIN: Procalcitonin: 3 ng/mL

## 2017-09-07 LAB — VITAMIN B12: Vitamin B-12: 2519 pg/mL — ABNORMAL HIGH (ref 180–914)

## 2017-09-07 LAB — MAGNESIUM: Magnesium: 1.5 mg/dL — ABNORMAL LOW (ref 1.7–2.4)

## 2017-09-07 MED ORDER — POTASSIUM CHLORIDE 20 MEQ/15ML (10%) PO SOLN
40.0000 meq | Freq: Once | ORAL | Status: AC
  Administered 2017-09-07: 40 meq
  Filled 2017-09-07: qty 30

## 2017-09-07 MED ORDER — MAGNESIUM SULFATE 2 GM/50ML IV SOLN
2.0000 g | Freq: Once | INTRAVENOUS | Status: AC
  Administered 2017-09-07: 2 g via INTRAVENOUS
  Filled 2017-09-07: qty 50

## 2017-09-07 MED ORDER — SODIUM CHLORIDE 0.9 % IV BOLUS
500.0000 mL | Freq: Once | INTRAVENOUS | Status: AC
  Administered 2017-09-07: 500 mL via INTRAVENOUS

## 2017-09-07 MED ORDER — DOCUSATE SODIUM 50 MG/5ML PO LIQD
50.0000 mg | Freq: Every day | ORAL | Status: DC | PRN
  Administered 2017-09-08: 50 mg via ORAL
  Filled 2017-09-07: qty 10

## 2017-09-07 MED ORDER — PHENYLEPHRINE HCL-NACL 10-0.9 MG/250ML-% IV SOLN
0.0000 ug/min | INTRAVENOUS | Status: DC
  Administered 2017-09-07: 20 ug/min via INTRAVENOUS
  Filled 2017-09-07: qty 250

## 2017-09-07 MED ORDER — POTASSIUM CHLORIDE 20 MEQ/15ML (10%) PO SOLN
40.0000 meq | ORAL | Status: AC
  Administered 2017-09-07 (×2): 40 meq
  Filled 2017-09-07 (×2): qty 30

## 2017-09-07 MED ORDER — FUROSEMIDE 10 MG/ML IJ SOLN
40.0000 mg | Freq: Two times a day (BID) | INTRAMUSCULAR | Status: AC
  Administered 2017-09-07 (×2): 40 mg via INTRAVENOUS
  Filled 2017-09-07 (×2): qty 4

## 2017-09-07 MED ORDER — POTASSIUM PHOSPHATES 15 MMOLE/5ML IV SOLN
30.0000 mmol | Freq: Once | INTRAVENOUS | Status: AC
  Administered 2017-09-07: 30 mmol via INTRAVENOUS
  Filled 2017-09-07: qty 10

## 2017-09-07 NOTE — Progress Notes (Signed)
Subjective/Chief Complaint: Intubated, tol tube feeds no pressors no events   Objective: Vital signs in last 24 hours: Temp:  [93.9 F (34.4 C)-98 F (36.7 C)] 98 F (36.7 C) (06/27 0733) Pulse Rate:  [57-110] 110 (06/27 0812) Resp:  [0-32] 31 (06/27 0812) BP: (88-150)/(52-93) 134/70 (06/27 0812) SpO2:  [89 %-100 %] 96 % (06/27 0812) FiO2 (%):  [50 %-60 %] 60 % (06/27 0812) Weight:  [66.1 kg (145 lb 11.6 oz)] 66.1 kg (145 lb 11.6 oz) (06/27 0500) Last BM Date: 09/04/17  Intake/Output from previous day: 06/26 0701 - 06/27 0700 In: 3500.6 [I.V.:2188.1; NG/GT:690; IV Piggyback:622.6] Out: 655 [Urine:605; Emesis/NG output:50] Intake/Output this shift: Total I/O In: 129.1 [I.V.:24.1; NG/GT:20; IV Piggyback:85] Out: 100 [Urine:100]  GI: soft nontender bs present  Lab Results:  Recent Labs    09/06/17 0427 09/07/17 0427  WBC 10.0 9.0  HGB 11.1* 10.2*  HCT 33.9* 31.1*  PLT 171 170   BMET Recent Labs    09/06/17 0427 09/07/17 0427  NA 144  145 141  K 3.0*  3.0* 2.7*  CL 111  112* 108  CO2 24  24 26   GLUCOSE 98  97 148*  BUN 25*  25* 22*  CREATININE 0.98  0.99 0.77  CALCIUM 7.7*  7.7* 7.0*   PT/INR No results for input(s): LABPROT, INR in the last 72 hours. ABG Recent Labs    09/06/17 0404 09/06/17 1505  PHART 7.372 7.397  HCO3 20.7 25.7    Studies/Results: Dg Chest Port 1 View  Result Date: 09/07/2017 CLINICAL DATA:  Follow-up pneumonia EXAM: PORTABLE CHEST 1 VIEW COMPARISON:  09/05/2017 FINDINGS: Cardiac shadow is stable. Endotracheal tube and nasogastric catheter are within normal limits. Persistent bilateral infiltrative changes are noted right greater than left similar to that seen on the prior exam. No new focal abnormality is noted. IMPRESSION: No significant change from the prior study. Electronically Signed   By: Alcide CleverMark  Lukens M.D.   On: 09/07/2017 08:27    Anti-infectives: Anti-infectives (From admission, onward)   Start      Dose/Rate Route Frequency Ordered Stop   09/04/17 1600  cefTRIAXone (ROCEPHIN) 1 g in sodium chloride 0.9 % 100 mL IVPB     1 g 200 mL/hr over 30 Minutes Intravenous Every 24 hours 09/03/17 2102 09/11/17 1559   09/04/17 1600  azithromycin (ZITHROMAX) 500 mg in sodium chloride 0.9 % 250 mL IVPB  Status:  Discontinued     500 mg 250 mL/hr over 60 Minutes Intravenous Every 24 hours 09/03/17 2102 09/06/17 0823   09/03/17 2330  valACYclovir (VALTREX) tablet 500 mg  Status:  Discontinued     500 mg Oral 2 times daily 09/03/17 2238 09/04/17 0140   09/03/17 2200  metroNIDAZOLE (FLAGYL) IVPB 500 mg  Status:  Discontinued     500 mg 100 mL/hr over 60 Minutes Intravenous Every 8 hours 09/03/17 2102 09/05/17 1858   09/03/17 1500  cefTRIAXone (ROCEPHIN) 1 g in sodium chloride 0.9 % 100 mL IVPB     1 g 200 mL/hr over 30 Minutes Intravenous  Once 09/03/17 1450 09/03/17 1559   09/03/17 1500  azithromycin (ZITHROMAX) 500 mg in sodium chloride 0.9 % 250 mL IVPB     500 mg 250 mL/hr over 60 Minutes Intravenous  Once 09/03/17 1450 09/03/17 1657      Assessment/Plan: Ischemic colitis -this is better by exam, labs -can advance tf as tolerated and give diet when extubated -appears to be resolving, will sign off, please  call back if needed  Emelia Loron 09/07/2017

## 2017-09-07 NOTE — Progress Notes (Signed)
..  Name: Carla Bowen MRN: [id number] DOB: 01-Dec-1955    ADMISSION DATE:  09/03/2017 CONSULTATION DATE:  09/04/2017  REFERRING MD :  Pearson Grippe MD  CHIEF COMPLAINT:  Abdominal pain. S/p fall. Hypotension  BRIEF PATIENT DESCRIPTION:  62 yr old female with PMHx Anxiety, Depression, Fibromyalgia, High cholesterol, Hypertension, and CVA p/w abdominal pain, febrile and hypotensive. Received 3.5L IVF and remains hypotensive PCCM consulted for admission.  SUBJECTIVE:  Remains sedated on vent. Started on TF yesterday trickle and tolerating.   VITAL SIGNS: Temp:  [93.9 F (34.4 C)-98 F (36.7 C)] 98 F (36.7 C) (06/27 0733) Pulse Rate:  [57-98] 98 (06/27 0700) Resp:  [18-32] 30 (06/27 0700) BP: (88-150)/(55-93) 120/68 (06/27 0700) SpO2:  [89 %-100 %] 92 % (06/27 0700) FiO2 (%):  [50 %-60 %] 50 % (06/27 0339) Weight:  [66.1 kg (145 lb 11.6 oz)] 66.1 kg (145 lb 11.6 oz) (06/27 0500)  PHYSICAL EXAMINATION:  General:  Acutely ill appearing female in NAD HEENT: [place]/AT, PERRL, no JVD Neuro: Sedated RASS -2. Does intermittently wake up agitated but becomes purposeful with redirection.  CV:  RRR, no MRG PULM: even/non-labored on MV, breathing at set rate of 30, lungs bilaterally rales, Minimal secretions. Did not tolerate PEEP/FiO2 wean this AM.  GI: Still some distension, Hypoactive bowel sounds.  Extremities: cool/dry, no edema  Skin: no rashes    Recent Labs  Lab 09/05/17 0241 09/06/17 0427 09/07/17 0427  NA 145 144  145 141  K 3.2* 3.0*  3.0* 2.7*  CL 119* 111  112* 108  CO2 21* 24  24 26   BUN 35* 25*  25* 22*  CREATININE 1.21* 0.98  0.99 0.77  GLUCOSE 95 98  97 148*   Recent Labs  Lab 09/05/17 0241 09/06/17 0427 09/07/17 0427  HGB 11.0* 11.1* 10.2*  HCT 34.7* 33.9* 31.1*  WBC 7.2 10.0 9.0  PLT 177 171 170    SIGNIFICANT EVENTS  6/23 admitted overnight/ hypotensive/ abd pain 6/24 worsening hypoxia -intubated that afternoon 6/26 > off pressors. Remains  on vent. Imaging improving.   STUDIES:  6/23 CT Ab/Pelv: Wall thickening of descending and proximal sigmoid colon is noted consistent with infectious or inflammatory colitis. Bilateral nonobstructive nephrolithiasis. Mild bilateral posterior basilar subsegmental atelectasis. 6/24 CXR >>New left-greater-than-right airspace disease, concerning for multifocal pneumonia or ARDS 6/24  AXR >> 1. Mildly dilated loops of small bowel throughout the abdomen with gas in the colon. Findings are compatible with adynamic ileus. 2. Thickening of the colonic wall compatible with colitis. 3. Bibasilar airspace disease compatible with multi lobar pneumonia versus ARDS 6/25 AXR >> Resolution of small bowel dilatation. Nasogastric catheter within the stomach. 6/24 TTE >>  Left ventricle: The cavity size was normal. Systolic function was   normal. The estimated ejection fraction was in the range of 55%   to 60%. Although no diagnostic regional wall motion abnormality  was identified, this possibility cannot be completely excluded on the basis of this study. Left ventricular diastolic function parameters were normal.  Cultures:  6/23 BC x 2 >> ngtd >> 6/23 GI panel PCR >> neg 6/24 MRSA PCR >> neg 6/24 Resp panel >> neg 6/24 UC >> neg 6/24 urine strep >> neg 6/24 urine legionella >> neg 6/25 trach aspirate >> few staph aureus   Antibiotics:  6/23 ceftriaxone >>> 6/23 Flagyl >> 6/25 6/23 azithromycin >> 6/25   Lines:  6/24 ETT >> 6/24 OGT >> 6/24 Foley >> PIV   ASSESSMENT / PLAN:  NEURO: Acute metabolic encephalopathy - improved Concern for withdrawal h/o ETOH use- last ETOH use was 6/23 AM, 1/5 liquor/week  P:  PAD protocol w/ precedex and fentanyl gtt. Wean as tolerated PRN versed RASS goal -1 to -2 WUA this AM Thiamine and Folate   CARDIAC Septic Shock > improved, off pressors 6/26.  P:  [facility] Will attempt diuresis. 13L positive and decreasing UOP Holding home  lisinopril, rosuvastatin, amlodipine  PULMONARY: Acute hypoxic respiratory failure  MPNA/ARDS - possible aspiration  P:  Full MV support.  Trend CXR - reviewed this AM. Developing pulmonary edema. Will diurese VAP measures See ID  ID: Severe Sepsis secondary to Colitis PNA -suspected Aspiration given hx vomiting P:  Ceftriaxone Few SA on sputum culture. No fevers or WBC. PCT down. Will defer ABX escalation.  Endocrine: Hypoglycemia > improved P:  CBG q 4  GI: Infectious and Inflammatory Colitis  Ileus > improved - last BM 6/24 Protein calorie malnutrition P:  Appreciate surgery input GI signed off.  Continue trickle feeds today, plan to escalate tomorrow. Goal 8150mL/hr  Heme: Leukopenia - improved Thrombocytopenia - stable P:  Trend CBC  RENAL Acute Kidney Injury - improving Anion gap metabolic / lactic/ hyperchloremic acidosis Mild hypernatremia - resolved Hypokalemia/ hypomag  P:  LR infusion to KVO Trend BMP /phos/ mag/ urinary output Replace electrolytes as indicated (Mag, Phos, and K this AM)  Family: Son [name] updated via phone  Joneen RoachPaul Cortina Vultaggio, AGACNP-BC [place] [facility]/Critical Care Pager [phone] or [phone]  09/07/2017 7:34 AM

## 2017-09-07 NOTE — Progress Notes (Signed)
[facility] FOR [facility] LAB REPLACEMENT ONLY  The patient does [facility] Protocol based on the criteria listed below:   1. Is GFR >/= 40 ml/min? Yes.    Patient's GFR today is >60 2. Is urine output >/= 0.5 ml/kg/hr for the last 6 hours? Yes.   Patient's UOP is 1 ml/kg/hr 3. Is BUN < 60 mg/dL? Yes.    Patient's BUN today is >60 4. Abnormal electrolyte(s): K- 2.7 5. Ordered repletion with:per protocol 6. If a panic level lab has been reported, has the CCM MD in charge been notified? Yes.  .   Physician:  Dr. Heywood Benegan  Dayton Sherr, Dixon BoosMaria Samson 09/07/2017 6:14 AM

## 2017-09-07 NOTE — Progress Notes (Deleted)
7.5 mg of Toradol wasted in sharps, witnessed by Luisa HartPatrick, RCharity fundraiser

## 2017-09-07 NOTE — Progress Notes (Signed)
RT called regarding pt's tube coming out 1cm. RT positioned tube back on 23 cm @ the lip. Pt's getting good volumes at this time.

## 2017-09-07 NOTE — Progress Notes (Addendum)
Pt frequently awakes from sleep with sudden panic like attacks, nursing able to console and deescalate episodes of agitation at times, versed 1mg  ivp given. Bilateral soft wrist restraints remain intact, RT notified ett malpositioned 20@lip . RT advanced ett to 23 @lip .

## 2017-09-08 ENCOUNTER — Inpatient Hospital Stay (HOSPITAL_COMMUNITY): Payer: Medicare Other

## 2017-09-08 LAB — CULTURE, BLOOD (ROUTINE X 2)
Culture: NO GROWTH
Culture: NO GROWTH
Culture: NO GROWTH
Special Requests: ADEQUATE
Special Requests: ADEQUATE

## 2017-09-08 LAB — GLUCOSE, CAPILLARY
Glucose-Capillary: 122 mg/dL — ABNORMAL HIGH (ref 70–99)
Glucose-Capillary: 108 mg/dL — ABNORMAL HIGH (ref 70–99)
Glucose-Capillary: 147 mg/dL — ABNORMAL HIGH (ref 70–99)
Glucose-Capillary: 140 mg/dL — ABNORMAL HIGH (ref 70–99)
Glucose-Capillary: 131 mg/dL — ABNORMAL HIGH (ref 70–99)

## 2017-09-08 LAB — BASIC METABOLIC PANEL
Anion gap: 10 (ref 5–15)
BUN: 8 mg/dL (ref 6–20)
CO2: 30 mmol/L (ref 22–32)
Calcium: 7.2 mg/dL — ABNORMAL LOW (ref 8.9–10.3)
Chloride: 102 mmol/L (ref 98–111)
Creatinine, Ser: 0.71 mg/dL (ref 0.44–1.00)
GFR calc Af Amer: >60 mL/min (ref >60)
GFR calc non Af Amer: >60 mL/min (ref >60)
Glucose, Bld: 125 mg/dL — ABNORMAL HIGH (ref 70–99)
Potassium: 3.1 mmol/L — ABNORMAL LOW (ref 3.5–5.1)
Sodium: 142 mmol/L (ref 135–145)

## 2017-09-08 LAB — CBC
HCT: 31 % — ABNORMAL LOW (ref 36.0–46.0)
Hemoglobin: 10.1 g/dL — ABNORMAL LOW (ref 12.0–15.0)
MCH: 31.7 pg (ref 26.0–34.0)
MCHC: 32.6 g/dL (ref 30.0–36.0)
MCV: 97.2 fL (ref 78.0–100.0)
Platelets: 185 10*3/uL (ref 150–400)
RBC: 3.19 MIL/uL — ABNORMAL LOW (ref 3.87–5.11)
RDW: 15 % (ref 11.5–15.5)
WBC: 10.9 10*3/uL — ABNORMAL HIGH (ref 4.0–10.5)

## 2017-09-08 LAB — FOLATE RBC
Folate, Hemolysate: 434.3 ng/mL
Folate, RBC: 1316 ng/mL (ref >498)
Hematocrit: 33 % — ABNORMAL LOW (ref 34.0–46.6)

## 2017-09-08 LAB — PHOSPHORUS: Phosphorus: 3.8 mg/dL (ref 2.5–4.6)

## 2017-09-08 LAB — MAGNESIUM: Magnesium: 1.4 mg/dL — ABNORMAL LOW (ref 1.7–2.4)

## 2017-09-08 MED ORDER — LAMOTRIGINE 25 MG PO TABS
25.0000 mg | ORAL_TABLET | Freq: Two times a day (BID) | ORAL | Status: DC
  Administered 2017-09-08 – 2017-09-11 (×7): 25 mg
  Filled 2017-09-08 (×7): qty 1

## 2017-09-08 MED ORDER — MIDAZOLAM HCL 2 MG/2ML IJ SOLN
INTRAMUSCULAR | Status: AC
  Filled 2017-09-08: qty 2

## 2017-09-08 MED ORDER — FENTANYL BOLUS VIA INFUSION
100.0000 ug | Freq: Once | INTRAVENOUS | Status: AC
  Administered 2017-09-08: 100 ug via INTRAVENOUS

## 2017-09-08 MED ORDER — MAGNESIUM SULFATE 4 GM/100ML IV SOLN
4.0000 g | Freq: Once | INTRAVENOUS | Status: AC
  Administered 2017-09-08: 4 g via INTRAVENOUS
  Filled 2017-09-08: qty 100

## 2017-09-08 MED ORDER — VITAL AF 1.2 CAL PO LIQD
1000.0000 mL | ORAL | Status: DC
  Administered 2017-09-08 – 2017-09-11 (×4): 1000 mL

## 2017-09-08 MED ORDER — POTASSIUM CHLORIDE 20 MEQ/15ML (10%) PO SOLN
40.0000 meq | Freq: Three times a day (TID) | ORAL | Status: AC
  Administered 2017-09-08 (×3): 40 meq
  Filled 2017-09-08 (×3): qty 30

## 2017-09-08 MED ORDER — QUETIAPINE FUMARATE 25 MG PO TABS
25.0000 mg | ORAL_TABLET | Freq: Two times a day (BID) | ORAL | Status: DC
  Administered 2017-09-08 – 2017-09-11 (×7): 25 mg
  Filled 2017-09-08 (×7): qty 1

## 2017-09-08 MED ORDER — ARIPIPRAZOLE 5 MG PO TABS: 5.0000 mg | ORAL_TABLET | Freq: Every day | ORAL | Status: DC

## 2017-09-08 MED ORDER — ARIPIPRAZOLE 5 MG PO TABS
5.0000 mg | ORAL_TABLET | Freq: Every day | ORAL | Status: DC
Start: 2017-09-08 — End: 2017-09-11
  Administered 2017-09-08 – 2017-09-11 (×4): 5 mg
  Filled 2017-09-08 (×4): qty 1

## 2017-09-08 MED ORDER — FENTANYL CITRATE (PF) 100 MCG/2ML IJ SOLN
INTRAMUSCULAR | Status: AC
  Filled 2017-09-08: qty 2

## 2017-09-08 MED ORDER — PHENYLEPHRINE HCL-NACL 10-0.9 MG/250ML-% IV SOLN
0.0000 ug/min | INTRAVENOUS | Status: DC
  Administered 2017-09-08: 10 ug/min via INTRAVENOUS
  Administered 2017-09-09 – 2017-09-10 (×3): 15 ug/min via INTRAVENOUS
  Administered 2017-09-10: 10 ug/min via INTRAVENOUS
  Administered 2017-09-10: 20 ug/min via INTRAVENOUS
  Administered 2017-09-11: 30 ug/min via INTRAVENOUS
  Administered 2017-09-11 (×2): 60 ug/min via INTRAVENOUS
  Administered 2017-09-11: 25 ug/min via INTRAVENOUS
  Administered 2017-09-11: 80 ug/min via INTRAVENOUS
  Administered 2017-09-12: 10 ug/min via INTRAVENOUS
  Administered 2017-09-12: 60 ug/min via INTRAVENOUS
  Filled 2017-09-08 (×17): qty 250

## 2017-09-08 MED ORDER — CEFAZOLIN SODIUM-DEXTROSE 2-4 GM/100ML-% IV SOLN
2.0000 g | Freq: Three times a day (TID) | INTRAVENOUS | Status: DC
  Administered 2017-09-08 – 2017-09-11 (×9): 2 g via INTRAVENOUS
  Filled 2017-09-08 (×10): qty 100

## 2017-09-08 MED ORDER — FUROSEMIDE 10 MG/ML IJ SOLN
40.0000 mg | Freq: Two times a day (BID) | INTRAMUSCULAR | Status: DC
  Administered 2017-09-08: 40 mg via INTRAVENOUS
  Filled 2017-09-08: qty 4

## 2017-09-08 NOTE — Progress Notes (Signed)
Nutrition Follow-up  DOCUMENTATION CODES:   Not applicable  INTERVENTION:   Vital AF @ 50 ml/hr (1200 ml/day) via OG tube Provides: 1440 kcal, 90 grams protein, and 972 ml free water.    NUTRITION DIAGNOSIS:   Inadequate oral intake related to inability to eat as evidenced by NPO status. Ongoing.  GOAL:   Patient will meet greater than or equal to 90% of their needs Met.   MONITOR:   Vent status, Labs, I & O's, Weight trends  ASSESSMENT:   62 year old female who presented to ED with abdominal pain, diarrhea, and weakness. PMH significant for EtOH abuse, anxiety, depression, hypertension, hyperlipidemia, CVA, and fibromyalgia.   Pt self-extubated this am but was immediatly re-intubated due to hypoxia. Pt was diuresed 5 L 6/27.  Per Surgery ischemic colitis better and TF can be advanced. Surgery has now signed off.   Patient is currently intubated on ventilator support MV: 10.9 L/min Temp (24hrs), Avg:99.2 F (37.3 C), Min:97.8 F (36.6 C), Max:101.3 F (38.5 C)  Medications reviewed and include: folic acid, thiamine, 40 mg lasix every 12 hours, miralax, 40 mEq KCl TID, 4 gram mag sulfate x 1 Labs reviewed: K+ 3.1 (L), magnesium 1.4 (L) Pt remains 12 L positive since admission, weight up 17 lb since admission weight of 124 lb (56.3 kg) UOP: 5575 ml x 24 hr   OG tube with Vital AF 1.2 infusing @ 50 ml/hr (advanced 6/28) Provides: 1440 kcal, 90 grams protein, and 972 ml free water     Diet Order:   Diet Order           Diet NPO time specified  Diet effective now          EDUCATION NEEDS:   No education needs have been identified at this time  Skin:  Skin Assessment: Reviewed RN Assessment  Last BM:  6/27 - medium type 5  Height:   Ht Readings from Last 1 Encounters:  09/04/17 _0  (1.549 m)    Weight:   Wt Readings from Last 1 Encounters:  09/08/17 141 lb 12.1 oz (64.3 kg)    Ideal Body Weight:  47.7 kg  BMI:  Body mass index is 26.78  kg/m.  Estimated Nutritional Needs:   Kcal:  1485 kcal/day  Protein:  75-90 grams/day  Fluid:  1.7 L/day  Maylon Peppers RD, LDN, CNSC [phone] Pager [phone] After Hours Pager

## 2017-09-08 NOTE — Progress Notes (Signed)
..  Name: Carla Bowen MRN: [id number] DOB: 1955-07-12    ADMISSION DATE:  09/03/2017 CONSULTATION DATE:  09/04/2017  REFERRING MD :  Pearson Grippe MD  CHIEF COMPLAINT:  Abdominal pain. S/p fall. Hypotension  BRIEF PATIENT DESCRIPTION:  62 yr old female with PMHx Anxiety, Depression, Fibromyalgia, High cholesterol, Hypertension, and CVA p/w abdominal pain, febrile and hypotensive. Received 3.5L IVF and remains hypotensive PCCM consulted for admission.  SUBJECTIVE:  Self extubated this morning, immediately failed due to hypoxia and was emergently reintubated. Diuresed 5L yesterday  VITAL SIGNS: Temp:  [97.8 F (36.6 C)-101.3 F (38.5 C)] 99.6 F (37.6 C) (06/28 0723) Pulse Rate:  [80-133] 127 (06/28 0800) Resp:  [18-31] 22 (06/28 0800) BP: (79-133)/(50-100) 118/74 (06/28 0800) SpO2:  [91 %-100 %] 93 % (06/28 0800) FiO2 (%):  [50 %-80 %] 60 % (06/28 0800) Weight:  [64.3 kg (141 lb 12.1 oz)] 64.3 kg (141 lb 12.1 oz) (06/28 0500)  PHYSICAL EXAMINATION:  General:  Acutely ill appearing female on vent HEENT: [place]/AT, PERRL, no JVD Neuro: very agitated despite sedation.   CV: Tachy, regular, no MRG PULM: Coarse bilateral, unable to tolerate weaning.  GI: Distension much improved. Tolerating tube feeds.  Extremities: cool/dry, no edema  Skin: no rashes    Recent Labs  Lab 09/07/17 1454 09/07/17 2022 09/08/17 0650  NA 143 142 142  K 3.4* 3.9 3.1*  CL 106 107 102  CO2 27 26 30   BUN 13 10 8   CREATININE 0.81 0.83 0.71  GLUCOSE 178* 117* 125*   Recent Labs  Lab 09/06/17 0427 09/07/17 0427 09/08/17 0650  HGB 11.1* 10.2* 10.1*  HCT 33.9* 31.1* 31.0*  WBC 10.0 9.0 10.9*  PLT 171 170 185    SIGNIFICANT EVENTS  6/23 admitted overnight/ hypotensive/ abd pain 6/24 worsening hypoxia -intubated that afternoon 6/26 > off pressors. Remains on vent. Imaging improving.  6/28 self extubated, failed immediately due to hypoxia. Emergently re-intubated.   STUDIES:  6/23 CT  Ab/Pelv: Wall thickening of descending and proximal sigmoid colon is noted consistent with infectious or inflammatory colitis. Bilateral nonobstructive nephrolithiasis. Mild bilateral posterior basilar subsegmental atelectasis. 6/24 CXR >>New left-greater-than-right airspace disease, concerning for multifocal pneumonia or ARDS 6/24  AXR >> 1. Mildly dilated loops of small bowel throughout the abdomen with gas in the colon. Findings are compatible with adynamic ileus. 2. Thickening of the colonic wall compatible with colitis. 3. Bibasilar airspace disease compatible with multi lobar pneumonia versus ARDS 6/25 AXR >> Resolution of small bowel dilatation. Nasogastric catheter within the stomach. 6/24 TTE >>  Left ventricle: The cavity size was normal. Systolic function was   normal. The estimated ejection fraction was in the range of 55%   to 60%. Although no diagnostic regional wall motion abnormality  was identified, this possibility cannot be completely excluded on the basis of this study. Left ventricular diastolic function parameters were normal.  Cultures:  6/23 BC x 2 >> neg 6/23 GI panel PCR >> neg 6/24 MRSA PCR >> neg 6/24 Resp panel >> neg 6/24 UC >> neg 6/24 urine strep >> neg 6/24 urine legionella >> neg 6/25 trach aspirate >> pan-sensitive staph aureus.   Antibiotics:  6/23 ceftriaxone >>  6/23 Flagyl >> 6/25 6/23 azithromycin >> 6/25   Lines:  6/24 ETT >> 6/24 OGT >> 6/24 Foley >> PIV   ASSESSMENT / PLAN: NEURO: Acute metabolic encephalopathy - improved Concern for withdrawal h/o ETOH use- last ETOH use was 6/23 AM, 1/5 liquor/week  P:  PAD protocol w/ precedex and fentanyl gtt. Wean as tolerated Add back home lamotrigine and aripiprazole  Start seroquel 25mg  BID PRN versed RASS goal -1 to -2 Thiamine and Folate   Septic Shock > resolved P:  [facility]. Will attempt diuresis. 11L positive and decreasing UOP Holding home lisinopril, rosuvastatin,  amlodipine  PULMONARY: Acute hypoxic respiratory failure  MPNA/ARDS - possible aspiration  P:  Full MV support.  Trend CXR - reviewed this AM. Developing pulmonary edema. Will continue diuresis VAP measures See ID  ID: Severe Sepsis secondary to Colitis PNA -suspected Aspiration given hx vomiting pan sensitive staph aureus on sputum cx 6/25 P:  Ceftriaxone continue  Trend WBC and fever curve.   Endocrine: Hypoglycemia > improved P:  CBG q 4  GI: Infectious and Inflammatory Colitis  Ileus > improved - last BM 6/24 Protein calorie malnutrition P:  Appreciate surgery input. GI signed off.  Escalate TF to 50/hr today.  Heme: Leukopenia - improved Thrombocytopenia - stable P:  Trend CBC  RENAL Acute Kidney Injury - improving Mild hypernatremia - resolved Hypokalemia/ hypomag  P:  LR infusion to KVO Trend BMP /phos/ mag/ urinary output Replace electrolytes as indicated (Mag, Phos, and K this AM)  Family: Family updated at length this afternoon.   Joneen RoachPaul Edon Hoadley, AGACNP-BC [facility] Pager [phone] or [phone]  09/08/2017 9:15 AM

## 2017-09-08 NOTE — Procedures (Signed)
Intubation Procedure Note Carla Bowen [id number] 1955/06/28  Procedure: Intubation Indications: Respiratory insufficiency  Procedure Details Consent: Unable to obtain consent because of emergent medical necessity. Time Out: Verified patient identification, verified procedure, site/side was marked, verified correct patient position, special equipment/implants available, medications/allergies/relevent history reviewed, required imaging and test results available.  Performed  Maximum sterile technique was used including Not applicable.  MAC and 3  7.5 endotracheal tube inserted to 22 cm at the teeth.  Grade 1 laryngoscopy view.  Single pass.  Easy intubation.  Evaluation Hemodynamic Status: BP stable throughout; O2 sats: stable throughout Patient's Current Condition: stable Complications: No apparent complications Patient did tolerate procedure well. Chest X-ray ordered to verify placement.  CXR: tube position acceptable.   Einar Grad Stanislaus Kaltenbach 09/08/2017

## 2017-09-08 NOTE — Progress Notes (Signed)
MD please call Pts sister Aldean BakerCathy Jones [phone] during rounds 09-09-17

## 2017-09-08 NOTE — Progress Notes (Signed)
Pt extubated self at approx 0901. Prior to self-extubation Pt alert/arousable and follows commands but having extreme intermittent anxiety. Pt also had mittens on and receiving anti-anxiety medications at time of extubation. Pt also pulled OGT out as well.  Pt immediately dropped O2 sats into 70s and was BVM'd with 100% O2 while preparing for RSI and re-intubation sats came back into 95-99 range after several min of bagging.   Pt RSI with 100mcg of fentanyl IV bolus, 50 of Rocuronium IV, and 16mg  of Etomidate IV and Dr Michae KavaAgarwal placed 7.5 ETT on first attempt.  OGT replaced and CXR obtained.

## 2017-09-09 ENCOUNTER — Inpatient Hospital Stay (HOSPITAL_COMMUNITY): Payer: Medicare Other

## 2017-09-09 LAB — CBC
HCT: 31.2 % — ABNORMAL LOW (ref 36.0–46.0)
Hemoglobin: 9.9 g/dL — ABNORMAL LOW (ref 12.0–15.0)
MCH: 31.7 pg (ref 26.0–34.0)
MCHC: 31.7 g/dL (ref 30.0–36.0)
MCV: 100 fL (ref 78.0–100.0)
Platelets: 203 10*3/uL (ref 150–400)
RBC: 3.12 MIL/uL — ABNORMAL LOW (ref 3.87–5.11)
RDW: 15.6 % — ABNORMAL HIGH (ref 11.5–15.5)
WBC: 13.3 10*3/uL — ABNORMAL HIGH (ref 4.0–10.5)

## 2017-09-09 LAB — BASIC METABOLIC PANEL
Anion gap: 8 (ref 5–15)
BUN: 9 mg/dL (ref 6–20)
CO2: 30 mmol/L (ref 22–32)
Calcium: 7.5 mg/dL — ABNORMAL LOW (ref 8.9–10.3)
Chloride: 103 mmol/L (ref 98–111)
Creatinine, Ser: 0.72 mg/dL (ref 0.44–1.00)
GFR calc Af Amer: >60 mL/min (ref >60)
GFR calc non Af Amer: >60 mL/min (ref >60)
Glucose, Bld: 151 mg/dL — ABNORMAL HIGH (ref 70–99)
Potassium: 3.9 mmol/L (ref 3.5–5.1)
Sodium: 141 mmol/L (ref 135–145)

## 2017-09-09 LAB — GLUCOSE, CAPILLARY
Glucose-Capillary: 111 mg/dL — ABNORMAL HIGH (ref 70–99)
Glucose-Capillary: 118 mg/dL — ABNORMAL HIGH (ref 70–99)
Glucose-Capillary: 131 mg/dL — ABNORMAL HIGH (ref 70–99)
Glucose-Capillary: 139 mg/dL — ABNORMAL HIGH (ref 70–99)
Glucose-Capillary: 150 mg/dL — ABNORMAL HIGH (ref 70–99)
Glucose-Capillary: 153 mg/dL — ABNORMAL HIGH (ref 70–99)
Glucose-Capillary: 155 mg/dL — ABNORMAL HIGH (ref 70–99)

## 2017-09-09 LAB — MAGNESIUM: Magnesium: 1.8 mg/dL (ref 1.7–2.4)

## 2017-09-09 LAB — CULTURE, BLOOD (ROUTINE X 2): Culture: NO GROWTH

## 2017-09-09 LAB — PHOSPHORUS: Phosphorus: 2.4 mg/dL — ABNORMAL LOW (ref 2.5–4.6)

## 2017-09-09 MED ORDER — CLONAZEPAM 0.5 MG PO TABS
0.5000 mg | ORAL_TABLET | Freq: Two times a day (BID) | ORAL | Status: DC
  Administered 2017-09-09 (×2): 0.5 mg
  Filled 2017-09-09 (×2): qty 1

## 2017-09-09 MED ORDER — METOPROLOL TARTRATE 5 MG/5ML IV SOLN
INTRAVENOUS | Status: AC
  Administered 2017-09-09: 5 mg
  Filled 2017-09-09: qty 5

## 2017-09-09 MED ORDER — METOPROLOL TARTRATE 5 MG/5ML IV SOLN
INTRAVENOUS | Status: AC
  Filled 2017-09-09: qty 5

## 2017-09-09 MED ORDER — CLONAZEPAM 0.1 MG/ML ORAL SUSPENSION
0.5000 mg | Freq: Two times a day (BID) | ORAL | Status: DC
  Filled 2017-09-09: qty 5

## 2017-09-09 MED ORDER — METOPROLOL TARTRATE 5 MG/5ML IV SOLN
5.0000 mg | INTRAVENOUS | Status: DC | PRN
  Administered 2017-09-13 – 2017-09-21 (×6): 5 mg via INTRAVENOUS
  Filled 2017-09-09 (×8): qty 5

## 2017-09-09 MED ORDER — FUROSEMIDE 10 MG/ML IJ SOLN
40.0000 mg | Freq: Once | INTRAMUSCULAR | Status: AC
  Administered 2017-09-09: 40 mg via INTRAVENOUS
  Filled 2017-09-09: qty 4

## 2017-09-09 MED ORDER — FENTANYL BOLUS VIA INFUSION
25.0000 ug | INTRAVENOUS | Status: DC | PRN
  Administered 2017-09-10 – 2017-09-11 (×5): 50 ug via INTRAVENOUS
  Administered 2017-09-11 – 2017-09-12 (×3): 100 ug via INTRAVENOUS
  Filled 2017-09-09: qty 100

## 2017-09-09 NOTE — Progress Notes (Signed)
..  Name: Carla Bowen MRN: [id number] DOB: 1955/05/06    ADMISSION DATE:  09/03/2017 CONSULTATION DATE:  09/04/2017  REFERRING MD :  Pearson Grippe MD  CHIEF COMPLAINT:  Abdominal pain. S/p fall. Hypotension  BRIEF PATIENT DESCRIPTION:  62 yr old female with PMHx Anxiety, Depression, Fibromyalgia, High cholesterol, Hypertension, and CVA p/w abdominal pain, febrile and hypotensive. Received 3.5L IVF and remains hypotensive PCCM consulted for admission.  SUBJECTIVE:  Self extubated yesterday, failed, reintubated within 5 mins  Intermittent agitation  Remains on peep 10, 70% FiO2    VITAL SIGNS: Temp:  [98.5 F (36.9 C)-101.3 F (38.5 C)] 99.7 F (37.6 C) (06/29 0728) Pulse Rate:  [77-123] 94 (06/29 0845) Resp:  [18-34] 28 (06/29 0845) BP: (71-150)/(49-99) 103/67 (06/29 0845) SpO2:  [82 %-100 %] 99 % (06/29 0845) FiO2 (%):  [40 %-70 %] 70 % (06/29 0703) Weight:  [62 kg (136 lb 11 oz)] 62 kg (136 lb 11 oz) (06/29 0500)  PHYSICAL EXAMINATION:  General:  wdwn female, NAD  HEENT: mm moist, no JVD, ETT Neuro: easily agitated, pulls at ETT, does follow some intermittent commands, nods appropriately but then tries to get OOB despite fentanyl gtt, precedex gtt CV: s1s2 rrr PULM: resps even non labored on vent 70%, peep 10, diminished throughout but essentially clear  GI: slightly distended, seems tender diffusely, +bs  Extremities: warm and dry, no edema   Skin: no rashes    Recent Labs  Lab 09/07/17 2022 09/08/17 0650 09/09/17 0405  NA 142 142 141  K 3.9 3.1* 3.9  CL 107 102 103  CO2 26 30 30   BUN 10 8 9   CREATININE 0.83 0.71 0.72  GLUCOSE 117* 125* 151*   Recent Labs  Lab 09/07/17 0427 09/08/17 0650 09/09/17 0405  HGB 10.2* 10.1* 9.9*  HCT 31.1*  33.0* 31.0* 31.2*  WBC 9.0 10.9* 13.3*  PLT 170 185 203    SIGNIFICANT EVENTS  6/23 admitted overnight/ hypotensive/ abd pain 6/24 worsening hypoxia -intubated that afternoon 6/26 > off pressors. Remains on  vent. Imaging improving.  6/28 self extubated, failed immediately due to hypoxia. Emergently re-intubated.   STUDIES:  6/23 CT Ab/Pelv: Wall thickening of descending and proximal sigmoid colon is noted consistent with infectious or inflammatory colitis. Bilateral nonobstructive nephrolithiasis. Mild bilateral posterior basilar subsegmental atelectasis. 6/24 CXR >>New left-greater-than-right airspace disease, concerning for multifocal pneumonia or ARDS 6/24  AXR >> 1. Mildly dilated loops of small bowel throughout the abdomen with gas in the colon. Findings are compatible with adynamic ileus. 2. Thickening of the colonic wall compatible with colitis. 3. Bibasilar airspace disease compatible with multi lobar pneumonia versus ARDS 6/25 AXR >> Resolution of small bowel dilatation. Nasogastric catheter within the stomach. 6/24 TTE >>  Left ventricle: The cavity size was normal. Systolic function was   normal. The estimated ejection fraction was in the range of 55%   to 60%. Although no diagnostic regional wall motion abnormality  was identified, this possibility cannot be completely excluded on the basis of this study. Left ventricular diastolic function parameters were normal.  Cultures:  6/23 BC x 2 >> neg 6/23 GI panel PCR >> neg 6/24 MRSA PCR >> neg 6/24 Resp panel >> neg 6/24 UC >> neg 6/24 urine strep >> neg 6/24 urine legionella >> neg 6/25 trach aspirate >> pan-sensitive staph aureus.   Antibiotics:  6/23 ceftriaxone >> 6/28 6/23 Flagyl >> 6/25 6/23 azithromycin >> 6/25 6/28 cefazolin>>>  Lines:  6/24 ETT >>6/28, 6/28>>> 6/24  OGT >> 6/24 Foley >> PIV   ASSESSMENT / PLAN:  Acute metabolic encephalopathy  Delirium  Hx ETOH abuse - ?some degree of ETOH withdrawal - last ETOH use 6/23 P:  Continue fentanyl, precedex gtts and wean as able  seroquel added 6/28 Will add low dose clonazepam as well 6/29 Continue home lamictal and aripiprazole  PRN versed  RASS goal -1 to -2    Thiamine, folate    Septic Shock > resolved Hypotension - likely sedation related at this point - nearly off pressors  Volume overload  P:  Continue attempts at gentle diuresis  Holding home lisinopril, rosuvastatin, amlodipine   Acute hypoxic respiratory failure  ARDS  MSSA PNA  P:  Vent support - 8cc/kg  F/u CXR  F/u ABG Diuresis as able  Control agitation as above    Severe Sepsis secondary to Colitis PNA -suspected Aspiration given hx vomiting pan sensitive staph aureus on sputum cx 6/25 P:  Continue ancef as above  Trend WBC, pct  Monitor abd exam  F/u KUB    Hypoglycemia > improved P:  CBG q 4  GI: Ischemic colitis - improving  ?infectious colitis Ileus > improved - last BM 6/24 Protein calorie malnutrition P:  Surgery signed off - appreciate input  Continue TF at goal  Follow abd exam  F/u KUB    Leukopenia - improved Thrombocytopenia - stable P:  Trend CBC SQ heparin    Acute Kidney Injury - resolved  Mild hypernatremia - resolved Hypokalemia/ hypomag  P:  KVO IVF  Gentle diuresis as above  Replete K PRN     Family: no family at bedside on NP rounds 6/29   Dirk DressKaty Jasime Westergren, NP 09/09/2017  9:21 AM Pager: [phone] or [phone]

## 2017-09-10 ENCOUNTER — Inpatient Hospital Stay (HOSPITAL_COMMUNITY): Payer: Medicare Other

## 2017-09-10 LAB — GLUCOSE, CAPILLARY
Glucose-Capillary: 122 mg/dL — ABNORMAL HIGH (ref 70–99)
Glucose-Capillary: 136 mg/dL — ABNORMAL HIGH (ref 70–99)
Glucose-Capillary: 136 mg/dL — ABNORMAL HIGH (ref 70–99)
Glucose-Capillary: 137 mg/dL — ABNORMAL HIGH (ref 70–99)
Glucose-Capillary: 154 mg/dL — ABNORMAL HIGH (ref 70–99)
Glucose-Capillary: 154 mg/dL — ABNORMAL HIGH (ref 70–99)

## 2017-09-10 LAB — BASIC METABOLIC PANEL
Anion gap: 7 (ref 5–15)
BUN: 9 mg/dL (ref 6–20)
CO2: 29 mmol/L (ref 22–32)
Calcium: 7.6 mg/dL — ABNORMAL LOW (ref 8.9–10.3)
Chloride: 102 mmol/L (ref 98–111)
Creatinine, Ser: 0.65 mg/dL (ref 0.44–1.00)
GFR calc Af Amer: >60 mL/min (ref >60)
GFR calc non Af Amer: >60 mL/min (ref >60)
Glucose, Bld: 139 mg/dL — ABNORMAL HIGH (ref 70–99)
Potassium: 3.3 mmol/L — ABNORMAL LOW (ref 3.5–5.1)
Sodium: 138 mmol/L (ref 135–145)

## 2017-09-10 LAB — CBC
HCT: 30.1 % — ABNORMAL LOW (ref 36.0–46.0)
Hemoglobin: 9.7 g/dL — ABNORMAL LOW (ref 12.0–15.0)
MCH: 32 pg (ref 26.0–34.0)
MCHC: 32.2 g/dL (ref 30.0–36.0)
MCV: 99.3 fL (ref 78.0–100.0)
Platelets: 220 10*3/uL (ref 150–400)
RBC: 3.03 MIL/uL — ABNORMAL LOW (ref 3.87–5.11)
RDW: 15.2 % (ref 11.5–15.5)
WBC: 11.6 10*3/uL — ABNORMAL HIGH (ref 4.0–10.5)

## 2017-09-10 MED ORDER — FUROSEMIDE 10 MG/ML IJ SOLN
40.0000 mg | Freq: Once | INTRAMUSCULAR | Status: AC
  Administered 2017-09-10: 40 mg via INTRAVENOUS
  Filled 2017-09-10: qty 4

## 2017-09-10 MED ORDER — POTASSIUM CHLORIDE CRYS ER 20 MEQ PO TBCR
40.0000 meq | EXTENDED_RELEASE_TABLET | Freq: Two times a day (BID) | ORAL | Status: AC
  Administered 2017-09-10 (×2): 40 meq via ORAL
  Filled 2017-09-10 (×2): qty 2

## 2017-09-10 MED ORDER — MIDAZOLAM HCL 2 MG/2ML IJ SOLN
4.0000 mg | Freq: Once | INTRAMUSCULAR | Status: AC
  Administered 2017-09-10: 4 mg via INTRAVENOUS

## 2017-09-10 MED ORDER — BISACODYL 10 MG RE SUPP
10.0000 mg | Freq: Once | RECTAL | Status: AC
  Administered 2017-09-10: 10 mg via RECTAL
  Filled 2017-09-10: qty 1

## 2017-09-10 MED ORDER — CLONAZEPAM 1 MG PO TABS
1.0000 mg | ORAL_TABLET | Freq: Two times a day (BID) | ORAL | Status: DC
  Administered 2017-09-10 – 2017-09-11 (×3): 1 mg
  Filled 2017-09-10 (×3): qty 1

## 2017-09-10 MED ORDER — DOCUSATE SODIUM 50 MG/5ML PO LIQD
50.0000 mg | Freq: Two times a day (BID) | ORAL | Status: DC
  Administered 2017-09-10 – 2017-09-11 (×3): 50 mg via ORAL
  Filled 2017-09-10 (×3): qty 10

## 2017-09-10 MED ORDER — IBUPROFEN 100 MG/5ML PO SUSP
400.0000 mg | Freq: Four times a day (QID) | ORAL | Status: DC | PRN
  Administered 2017-09-11 (×2): 400 mg via ORAL
  Filled 2017-09-10 (×3): qty 20

## 2017-09-10 NOTE — Progress Notes (Signed)
..  Name: Carla Bowen MRN: [id number] DOB: [birth year]    ADMISSION DATE:  09/03/2017 CONSULTATION DATE:  09/04/2017  REFERRING MD :  Pearson Grippe MD  CHIEF COMPLAINT:  Abdominal pain. S/p fall. Hypotension  BRIEF PATIENT DESCRIPTION:  62 yr old female with PMHx Anxiety, Depression, Fibromyalgia, High cholesterol, Hypertension, and CVA p/w abdominal pain, febrile and hypotensive. Received 3.5L IVF and remains hypotensive PCCM consulted for admission.  SUBJECTIVE:  Remains easily agitated despite addition of enteral sedation  O2 needs improve when she is sedated    VITAL SIGNS: Temp:  [97.6 F (36.4 C)-100.1 F (37.8 C)] 97.6 F (36.4 C) (06/30 0719) Pulse Rate:  [76-192] 92 (06/30 0830) Resp:  [16-33] 30 (06/30 0830) BP: (61-146)/(42-112) 103/62 (06/30 0830) SpO2:  [91 %-100 %] 95 % (06/30 0830) FiO2 (%):  [40 %-70 %] 40 % (06/30 0800) Weight:  [60.7 kg (133 lb 13.1 oz)] 60.7 kg (133 lb 13.1 oz) (06/30 0430)  PHYSICAL EXAMINATION:  General: wdwn female, NAD currently after versed push  HEENT: mm moist, ETT  Neuro: sedated, RASS currently -2, MAE, easily agitated though, with any stimulation she pulls at ETT, kicks, tries to get OOB  CV: s1s2 rrr PULM: resps even non labored on vent, good vent synchrony when sedated, O2 needs improved when sedated, diminished throughout  GI: slightly distended, soft, +bs  Extremities: warm and dry no edema     Recent Labs  Lab 09/08/17 0650 09/09/17 0405 09/10/17 0256  NA 142 141 138  K 3.1* 3.9 3.3*  CL 102 103 102  CO2 30 30 29   BUN 8 9 9   CREATININE 0.71 0.72 0.65  GLUCOSE 125* 151* 139*   Recent Labs  Lab 09/08/17 0650 09/09/17 0405 09/10/17 0256  HGB 10.1* 9.9* 9.7*  HCT 31.0* 31.2* 30.1*  WBC 10.9* 13.3* 11.6*  PLT 185 203 220    SIGNIFICANT EVENTS  6/23 admitted overnight/ hypotensive/ abd pain 6/24 worsening hypoxia -intubated that afternoon 6/26 > off pressors. Remains on vent. Imaging improving.  6/28  self extubated, failed immediately due to hypoxia. Emergently re-intubated.   STUDIES:  6/23 CT Ab/Pelv: Wall thickening of descending and proximal sigmoid colon is noted consistent with infectious or inflammatory colitis. Bilateral nonobstructive nephrolithiasis. Mild bilateral posterior basilar subsegmental atelectasis. 6/24 CXR >>New left-greater-than-right airspace disease, concerning for multifocal pneumonia or ARDS 6/24  AXR >> 1. Mildly dilated loops of small bowel throughout the abdomen with gas in the colon. Findings are compatible with adynamic ileus. 2. Thickening of the colonic wall compatible with colitis. 3. Bibasilar airspace disease compatible with multi lobar pneumonia versus ARDS 6/25 AXR >> Resolution of small bowel dilatation. Nasogastric catheter within the stomach. 6/24 TTE >>  Left ventricle: The cavity size was normal. Systolic function was   normal. The estimated ejection fraction was in the range of 55%   to 60%. Although no diagnostic regional wall motion abnormality  was identified, this possibility cannot be completely excluded on the basis of this study. Left ventricular diastolic function parameters were normal.  Cultures:  6/23 BC x 2 >> neg 6/23 GI panel PCR >> neg 6/24 MRSA PCR >> neg 6/24 Resp panel >> neg 6/24 UC >> neg 6/24 urine strep >> neg 6/24 urine legionella >> neg 6/25 trach aspirate >> pan-sensitive staph aureus.   Antibiotics:  6/23 ceftriaxone >> 6/28 6/23 Flagyl >> 6/25 6/23 azithromycin >> 6/25 6/28 cefazolin>>>  Lines:  6/24 ETT >>6/28, 6/28>>> 6/24 OGT >> 6/24 Foley >> PIV  ASSESSMENT / PLAN:  Acute metabolic encephalopathy  Delirium  Hx ETOH abuse - ?some degree of ETOH withdrawal - last ETOH use 6/23 P:  Continue fentanyl, precedex gtts and wean as able  Continue seroquel, lamictal, aripiprazole  Increase clonazepam to 1mg  BID  PRN versed  RASS goal -1 to -2  Thiamine, folate    Septic Shock > resolved Hypotension -  likely sedation related at this point - nearly off pressors  Volume overload  P:  Repeat lasix 40mg  IV x 1 today  Holding home lisinopril, rosuvastatin, amlodipine   Acute hypoxic respiratory failure  ARDS  MSSA PNA  P:  Vent support - 8cc/kg  F/u CXR  F/u ABG Diuresis as above  Control agitation - limiting factor at this point to weaning    Severe Sepsis secondary to Colitis PNA -suspected Aspiration given hx vomiting pan sensitive staph aureus on sputum cx 6/25 P:  Ancef as above  Trend pct, wbc  Intermittent f/u KUB    Hypoglycemia > improved P:  CBG q 4   GI: Ischemic colitis - improving  ?infectious colitis Ileus > improved - last BM 6/24 Protein calorie malnutrition P:  Surgery signed off - appreciate input  Continue TF at goal  Follow abd exam  F/u KUB intermittently  Bowel regimen   Leukopenia - improved Thrombocytopenia - stable P:  Trend CBC SQ heparin    Acute Kidney Injury - resolved  Mild hypernatremia - resolved Hypokalemia/ hypomag  P:  KVO IVF  Gentle diuresis as above  Replete K    Family: no family at bedside on NP rounds 6/30 - they were updated at bedside late afternoon 6/29 on pm rounds    Dirk DressKaty Havish Petties, NP 09/10/2017  9:06 AM Pager: [phone] or [phone]) [phone]

## 2017-09-10 NOTE — Progress Notes (Signed)
Pt attempting to climb out of bed, reaching for ET tube, unable to calm. Fentanyl bolus given w/ no relief, unable to give PRN versed at this time. Spoke w/ Dr. Denese KillingsAgarwala to relay, received verbal order for 1x dose 4mg  versed for now.

## 2017-09-10 NOTE — Progress Notes (Signed)
Patient sats between 91-92% while agitated in bed at this time, will leave PEEP at 8. RN aware.

## 2017-09-10 NOTE — Progress Notes (Signed)
eLink Physician-Brief Progress Note Patient Name: Carla Bowen DOB: [birth year] MRN: [id number]   Date of Service  09/10/2017  HPI/Events of Note  Fever to 102.5 F - Request for Tylenol. Already on Ancef for pan sensitive staph in sputum. ALT slightly elevated. Will avoid Tylenol. Creatinine= 0.65.   eICU Interventions  Will order: 1. Motrin Suspension 400 mg per tube Q 6 hours PRN Temp > 100.5 F.     Intervention Category Major Interventions: Infection - evaluation and management  Pierina Schuknecht Eugene 09/10/2017, 11:55 PM

## 2017-09-11 ENCOUNTER — Inpatient Hospital Stay (HOSPITAL_COMMUNITY): Payer: Medicare Other

## 2017-09-11 ENCOUNTER — Inpatient Hospital Stay: Payer: Self-pay

## 2017-09-11 DIAGNOSIS — R6521 Severe sepsis with septic shock: Secondary | ICD-10-CM

## 2017-09-11 DIAGNOSIS — A419 Sepsis, unspecified organism: Principal | ICD-10-CM

## 2017-09-11 LAB — GLUCOSE, CAPILLARY
Glucose-Capillary: 145 mg/dL — ABNORMAL HIGH (ref 70–99)
Glucose-Capillary: 165 mg/dL — ABNORMAL HIGH (ref 70–99)
Glucose-Capillary: 182 mg/dL — ABNORMAL HIGH (ref 70–99)
Glucose-Capillary: 187 mg/dL — ABNORMAL HIGH (ref 70–99)
Glucose-Capillary: 112 mg/dL — ABNORMAL HIGH (ref 70–99)
Glucose-Capillary: 99 mg/dL (ref 70–99)

## 2017-09-11 LAB — MAGNESIUM
Magnesium: 1.5 mg/dL — ABNORMAL LOW (ref 1.7–2.4)
Magnesium: 2.4 mg/dL (ref 1.7–2.4)

## 2017-09-11 LAB — BASIC METABOLIC PANEL
Anion gap: 10 (ref 5–15)
BUN: 11 mg/dL (ref 6–20)
CO2: 24 mmol/L (ref 22–32)
Calcium: 7.4 mg/dL — ABNORMAL LOW (ref 8.9–10.3)
Chloride: 103 mmol/L (ref 98–111)
Creatinine, Ser: 0.65 mg/dL (ref 0.44–1.00)
GFR calc Af Amer: >60 mL/min (ref >60)
GFR calc non Af Amer: >60 mL/min (ref >60)
Glucose, Bld: 155 mg/dL — ABNORMAL HIGH (ref 70–99)
Potassium: 3.6 mmol/L (ref 3.5–5.1)
Sodium: 137 mmol/L (ref 135–145)

## 2017-09-11 LAB — CBC
HCT: 30.4 % — ABNORMAL LOW (ref 36.0–46.0)
Hemoglobin: 9.7 g/dL — ABNORMAL LOW (ref 12.0–15.0)
MCH: 31.7 pg (ref 26.0–34.0)
MCHC: 31.9 g/dL (ref 30.0–36.0)
MCV: 99.3 fL (ref 78.0–100.0)
Platelets: 279 10*3/uL (ref 150–400)
RBC: 3.06 MIL/uL — ABNORMAL LOW (ref 3.87–5.11)
RDW: 15.4 % (ref 11.5–15.5)
WBC: 8.7 10*3/uL (ref 4.0–10.5)

## 2017-09-11 LAB — PHOSPHORUS: Phosphorus: 2.2 mg/dL — ABNORMAL LOW (ref 2.5–4.6)

## 2017-09-11 LAB — PROCALCITONIN: Procalcitonin: 2.53 ng/mL

## 2017-09-11 MED ORDER — VANCOMYCIN HCL 10 G IV SOLR
1250.0000 mg | Freq: Once | INTRAVENOUS | Status: AC
  Administered 2017-09-11: 1250 mg via INTRAVENOUS
  Filled 2017-09-11: qty 1250

## 2017-09-11 MED ORDER — FENTANYL CITRATE (PF) 2500 MCG/50ML IJ SOLN
25.0000 ug/h | Status: DC
  Administered 2017-09-11: 200 ug/h via INTRAVENOUS
  Administered 2017-09-12: 300 ug/h via INTRAVENOUS
  Filled 2017-09-11 (×2): qty 100

## 2017-09-11 MED ORDER — SODIUM CHLORIDE 0.9% FLUSH
10.0000 mL | Freq: Two times a day (BID) | INTRAVENOUS | Status: DC
  Administered 2017-09-11 – 2017-09-14 (×6): 10 mL
  Administered 2017-09-15: 30 mL
  Administered 2017-09-16: 10 mL
  Administered 2017-09-16 – 2017-09-17 (×2): 30 mL
  Administered 2017-09-17 – 2017-09-19 (×3): 10 mL
  Administered 2017-09-20: 30 mL
  Administered 2017-09-20 – 2017-09-23 (×5): 10 mL
  Administered 2017-09-23: 20 mL
  Administered 2017-09-24 – 2017-09-25 (×3): 10 mL

## 2017-09-11 MED ORDER — DOCUSATE SODIUM 50 MG/5ML PO LIQD: 50.0000 mg | Freq: Two times a day (BID) | ORAL | Status: DC | PRN

## 2017-09-11 MED ORDER — FAMOTIDINE IN NACL 20-0.9 MG/50ML-% IV SOLN
20.0000 mg | Freq: Two times a day (BID) | INTRAVENOUS | Status: DC
  Administered 2017-09-11 – 2017-09-13 (×5): 20 mg via INTRAVENOUS
  Filled 2017-09-11 (×5): qty 50

## 2017-09-11 MED ORDER — SODIUM CHLORIDE 0.9 % IV SOLN
1.0000 g | Freq: Three times a day (TID) | INTRAVENOUS | Status: AC
  Administered 2017-09-11 – 2017-09-17 (×20): 1 g via INTRAVENOUS
  Filled 2017-09-11 (×20): qty 1

## 2017-09-11 MED ORDER — SODIUM CHLORIDE 0.9 % IV BOLUS
1000.0000 mL | Freq: Once | INTRAVENOUS | Status: AC
  Administered 2017-09-11: 1000 mL via INTRAVENOUS

## 2017-09-11 MED ORDER — FOLIC ACID 5 MG/ML IJ SOLN
1.0000 mg | Freq: Every day | INTRAMUSCULAR | Status: DC
  Administered 2017-09-12 – 2017-09-13 (×2): 1 mg via INTRAVENOUS
  Filled 2017-09-11 (×2): qty 0.2

## 2017-09-11 MED ORDER — CHLORHEXIDINE GLUCONATE CLOTH 2 % EX PADS
6.0000 | MEDICATED_PAD | Freq: Every day | CUTANEOUS | Status: DC
Start: 2017-09-11 — End: 2017-09-25
  Administered 2017-09-11 – 2017-09-25 (×15): 6 via TOPICAL

## 2017-09-11 MED ORDER — SODIUM PHOSPHATES 45 MMOLE/15ML IV SOLN
10.0000 mmol | Freq: Once | INTRAVENOUS | Status: AC
  Administered 2017-09-11: 10 mmol via INTRAVENOUS
  Filled 2017-09-11: qty 3.33

## 2017-09-11 MED ORDER — SODIUM CHLORIDE 0.9 % IV SOLN
6.0000 g | Freq: Once | INTRAVENOUS | Status: AC
  Administered 2017-09-11: 6 g via INTRAVENOUS
  Filled 2017-09-11: qty 10

## 2017-09-11 MED ORDER — INSULIN ASPART 100 UNIT/ML ~~LOC~~ SOLN
0.0000 [IU] | SUBCUTANEOUS | Status: DC
  Administered 2017-09-11 (×2): 3 [IU] via SUBCUTANEOUS
  Administered 2017-09-13 – 2017-09-14 (×2): 2 [IU] via SUBCUTANEOUS
  Administered 2017-09-14: 3 [IU] via SUBCUTANEOUS
  Administered 2017-09-14 – 2017-09-15 (×4): 2 [IU] via SUBCUTANEOUS
  Administered 2017-09-15: 3 [IU] via SUBCUTANEOUS
  Administered 2017-09-15: 2 [IU] via SUBCUTANEOUS
  Administered 2017-09-15: 3 [IU] via SUBCUTANEOUS
  Administered 2017-09-16 (×2): 2 [IU] via SUBCUTANEOUS
  Administered 2017-09-16: 3 [IU] via SUBCUTANEOUS
  Administered 2017-09-17 – 2017-09-19 (×11): 2 [IU] via SUBCUTANEOUS
  Administered 2017-09-19: 3 [IU] via SUBCUTANEOUS
  Administered 2017-09-20 (×5): 2 [IU] via SUBCUTANEOUS
  Administered 2017-09-21 (×2): 3 [IU] via SUBCUTANEOUS
  Administered 2017-09-21: 2 [IU] via SUBCUTANEOUS
  Administered 2017-09-22 (×2): 3 [IU] via SUBCUTANEOUS
  Administered 2017-09-23 (×2): 2 [IU] via SUBCUTANEOUS
  Administered 2017-09-23: 3 [IU] via SUBCUTANEOUS
  Administered 2017-09-23 – 2017-09-25 (×9): 2 [IU] via SUBCUTANEOUS

## 2017-09-11 MED ORDER — THIAMINE HCL 100 MG/ML IJ SOLN
100.0000 mg | Freq: Every day | INTRAMUSCULAR | Status: DC
  Administered 2017-09-12 – 2017-09-13 (×2): 100 mg via INTRAVENOUS
  Filled 2017-09-11 (×2): qty 2

## 2017-09-11 MED ORDER — VANCOMYCIN HCL IN DEXTROSE 750-5 MG/150ML-% IV SOLN
750.0000 mg | Freq: Two times a day (BID) | INTRAVENOUS | Status: DC
  Administered 2017-09-12 – 2017-09-13 (×3): 750 mg via INTRAVENOUS
  Filled 2017-09-11 (×3): qty 150

## 2017-09-11 MED ORDER — ROCURONIUM BROMIDE 50 MG/5ML IV SOLN
30.0000 mg | Freq: Once | INTRAVENOUS | Status: AC
  Administered 2017-09-11: 30 mg via INTRAVENOUS
  Filled 2017-09-11: qty 3

## 2017-09-11 MED ORDER — POTASSIUM CHLORIDE 20 MEQ/15ML (10%) PO SOLN
20.0000 meq | ORAL | Status: AC
  Administered 2017-09-11 (×2): 20 meq
  Filled 2017-09-11 (×2): qty 15

## 2017-09-11 MED ORDER — POLYETHYLENE GLYCOL 3350 17 G PO PACK
17.0000 g | PACK | Freq: Every day | ORAL | Status: DC | PRN
  Administered 2017-09-23: 17 g
  Filled 2017-09-11 (×2): qty 1

## 2017-09-11 MED ORDER — NICOTINE 7 MG/24HR TD PT24
7.0000 mg | MEDICATED_PATCH | Freq: Every day | TRANSDERMAL | Status: DC
  Administered 2017-09-12 – 2017-09-15 (×4): 7 mg via TRANSDERMAL
  Filled 2017-09-11 (×5): qty 1

## 2017-09-11 MED ORDER — SODIUM CHLORIDE 0.9% FLUSH
10.0000 mL | INTRAVENOUS | Status: DC | PRN
  Administered 2017-09-17: 30 mL
  Filled 2017-09-11: qty 40

## 2017-09-11 MED ORDER — QUETIAPINE FUMARATE 50 MG PO TABS: 50.0000 mg | ORAL_TABLET | Freq: Two times a day (BID) | ORAL | Status: DC

## 2017-09-11 NOTE — Progress Notes (Signed)
..  Name: Carla Bowen MRN: [id number] DOB: 25-Jan-1956    ADMISSION DATE:  09/03/2017 CONSULTATION DATE:  09/04/2017  REFERRING MD :  Pearson Grippe MD  CHIEF COMPLAINT:  Abdominal pain. S/p fall. Hypotension  BRIEF PATIENT DESCRIPTION:  62 yr old female with PMHx Anxiety, Depression, Fibromyalgia, High cholesterol, Hypertension, and CVA p/w abdominal pain, febrile and hypotensive. Received 3.5L IVF and remains hypotensive PCCM consulted for admission.  SUBJECTIVE:  Diuresed 4 L yesterday Per Nursing, agitation overnight requiring versed pushes On PRVC: 40%, 8 peep On Precedex 1.2 mcg, fentanyl 300 mcg, Neosynephrine 80 mcg   VITAL SIGNS: Temp:  [97.3 F (36.3 C)-102.5 F (39.2 C)] 98.1 F (36.7 C) (07/01 0716) Pulse Rate:  [76-132] 85 (07/01 1030) Resp:  [20-33] 30 (07/01 1030) BP: (71-146)/(41-92) 90/49 (07/01 1030) SpO2:  [88 %-100 %] 100 % (07/01 1030) FiO2 (%):  [0.8 %-40 %] 0.8 % (07/01 0703) Weight:  [131 lb 2.8 oz (59.5 kg)] 131 lb 2.8 oz (59.5 kg) (07/01 0400)  PHYSICAL EXAMINATION:  General: Acutely ill appearing female, laying in bed, intubated on PRVC, in no acute distress  HEENT: Atraumatic, normocephalic, Pupils PERRL, MM moist/pink, ETT in place Neuro: Sedated, recently received versed push, with stimulation she becomes agitated sedated  CV: RRR, s1s2 noted, no M/R/G PULM: Expiratory/inspiratory wheezing throughout, crackles right base, even, non-labored, vent assisted    GI: Soft, nontender, slightly distended, BS+ x4  Extremities: Warm, no edema warm. No deformities Skin: Warm, dry.  No obvious rashes, lesions, or ulcerations     Recent Labs  Lab 09/09/17 0405 09/10/17 0256 09/11/17 0253  NA 141 138 137  K 3.9 3.3* 3.6  CL 103 102 103  CO2 30 29 24   BUN 9 9 11   CREATININE 0.72 0.65 0.65  GLUCOSE 151* 139* 155*   Recent Labs  Lab 09/09/17 0405 09/10/17 0256 09/11/17 0253  HGB 9.9* 9.7* 9.7*  HCT 31.2* 30.1* 30.4*  WBC 13.3* 11.6* 8.7    PLT 203 220 279    SIGNIFICANT EVENTS  6/23 admitted overnight/ hypotensive/ abd pain 6/24 worsening hypoxia -intubated that afternoon 6/26 > off pressors. Remains on vent. Imaging improving.  6/28 self extubated, failed immediately due to hypoxia. Emergently re-intubated.  7/1>> Fevers, increasing vasopressor requirements  STUDIES:  6/23 CT Ab/Pelv: Wall thickening of descending and proximal sigmoid colon is noted consistent with infectious or inflammatory colitis. Bilateral nonobstructive nephrolithiasis. Mild bilateral posterior basilar subsegmental atelectasis. 6/24 CXR >>New left-greater-than-right airspace disease, concerning for multifocal pneumonia or ARDS 6/24  AXR >> 1. Mildly dilated loops of small bowel throughout the abdomen with gas in the colon. Findings are compatible with adynamic ileus. 2. Thickening of the colonic wall compatible with colitis. 3. Bibasilar airspace disease compatible with multi lobar pneumonia versus ARDS 6/25 AXR >> Resolution of small bowel dilatation. Nasogastric catheter within the stomach. 6/24 TTE >>  Left ventricle: The cavity size was normal. Systolic function was   normal. The estimated ejection fraction was in the range of 55%   to 60%. Although no diagnostic regional wall motion abnormality  was identified, this possibility cannot be completely excluded on the basis of this study. Left ventricular diastolic function parameters were normal.  Cultures:  6/23 BC x 2 >> neg 6/23 GI panel PCR >> neg 6/24 MRSA PCR >> neg 6/24 Resp panel >> neg 6/24 UC >> neg 6/24 urine strep >> neg 6/24 urine legionella >> neg 6/25 trach aspirate >> pan-sensitive staph aureus.  7/1 Trach aspirate>>  7/1 Blood cultures x2>> 7/1 Urine>>  Antibiotics:  6/23 ceftriaxone >> 6/28 6/23 Flagyl >> 6/25 6/23 azithromycin >> 6/25 6/28 cefazolin>>>7/1 7/1 Vancomycin>> 7/1 Cefepime>>  Lines:  6/24 ETT >>6/28, 6/28>>> 6/24 OGT >> 6/24 Foley >> PIV    ASSESSMENT  / PLAN:  NEURO A: Acute metabolic encephalopathy  Delirium  Hx ETOH abuse - ?some degree of ETOH withdrawal - last ETOH use 6/23 P:  RASS goal: -1 to -2 (given desaturation with agitation) Daily WUA Fentanyl & Precedex gtts to maintain RASS goal Continue Lamictal, Aripiprazole, Clonazepam Increase Seroquel to 50 mg BID Prn versed Thiamine, folate  CARDIOVASCULAR A: Septic Shock  Hypotension  Volume overload >>? Hypovolemia post diuresis 6/30 -Diuresed 4L 6/30 P:  ICU monitoring Neo-synephrine prn to maintain MAP>65, will transition to Levophed once central access obtained Hold home Lisinopril, rosuvastatin, Norvasc Will hold off on diuresis 7/1 given increased Neo-synephrine requirements Will give 1L NS bolus Pan cultures 7/1 Initiate broad spectrum coverage with Vanc and Cefepime 7/1  RESPIRATORY A: Acute hypoxic respiratory failure  ARDS  MSSA PNA  Small L pleural effusion (? Loculated)  P:  Vent support: ARDS Protocol/settings Wean Peep & FiO2 as tolerated Follow ABG & CXR VAP Bundle Pulmonary hygiene Control agitation - limiting factor to weaning Will hold off on Diuresis 7/1 given Neo-synephrine requirements Perform bedside Ultasound, ? Need for thoracentesis  INFECTIOUS A: Severe Sepsis >>worsening 7/1 PNA -suspected Aspiration given hx vomiting pan sensitive staph aureus on sputum cx 6/25 Fevers P:  Monitor fever curve Trend WBC's Trend PCT Pan culture 7/1 Initiate broad spectrum coverage with Vanc and Cefepime D/C Ancef 7/1 KUB 7/1   ENDOCRINE A: Hypoglycemia > improved P:  CBG's q4h SSI  GI A: Ischemic colitis - improving  ?infectious colitis Ileus > improved - last BM 6/24 Protein calorie malnutrition P:  Surgery signed off - appreciate input Continue TF Follow abdominal exam Bowel regimen Obtain KUB 7/1   HEMATOLOGIC A: Thrombocytopenia - resolved Anemia P:  Monitor for s/sx of bleeding Trend CBC Transfuse for Hgb  <7 SQ Heparin for VTE prophylaxis Trend CBC   RENAL A: Acute Kidney Injury - resolved  Mild hypernatremia - resolved Hypokalemia/ hypomag P:  Monitor I&O's / urine output Trend BMP Ensure adequate renal perfusion Avoid nephrotoxic agents as able Replace electrolytes as indicated   Family: No family present at bedside 7/1.   Harlon DittyJeremiah Cherissa Hook, AGACNP-BC [facility] 09/11/2017  10:58 AM

## 2017-09-11 NOTE — Progress Notes (Signed)
Pharmacy Antibiotic Note  Vonzell SchlatterBonnie M Hale is a 62 y.o. female admitted on 09/03/2017 with sepsis.  Pharmacy has been consulted for Cefepime and vancomycin dosing. Patient is currently on IV cefazolin for MSSA pneumonia. WBC has improved but spiking intermittent fevers. Tm 102.97F. Pressor requirement also increasing. CrCl ~ 80 mL/min  Plan: -Cefepime 1 gm IV Q 8 hours -Vancomycin 1250 mg IV once, then start Vancomycin 750 mg IV Q 12 hours -Monitor CBC, renal fx, cultures and clinical progress -VT at SS   Height: 5\' 1"  (154.9 cm) Weight: 131 lb 2.8 oz (59.5 kg) IBW/kg (Calculated) : 47.8  Temp (24hrs), Avg:99.9 F (37.7 C), Min:98.1 F (36.7 C), Max:102.5 F (39.2 C)  Recent Labs  Lab 09/04/17 1701 09/05/17 0241  09/07/17 0427  09/07/17 2022 09/08/17 0650 09/09/17 0405 09/10/17 0256 09/11/17 0253  WBC  --  7.2   < > 9.0  --   --  10.9* 13.3* 11.6* 8.7  CREATININE 1.36* 1.21*   < > 0.77   < > 0.83 0.71 0.72 0.65 0.65  LATICACIDVEN 1.8 1.6  --   --   --   --   --   --   --   --    < > = values in this interval not displayed.    Estimated Creatinine Clearance: 62.8 mL/min (by C-G formula based on SCr of 0.65 mg/dL).    Allergies  Allergen Reactions  . Ciprofloxacin Itching, Nausea And Vomiting and Rash  . Codeine Hives  . Sulfa Antibiotics Hives  . Tramadol Hives    7/1 Cefepime >> 7/1 Vanc>>  6/28 cefazolin > 7/1 6/23 azithro > 6/26 6/23 ceftriaxone > (6/30) 6/23 flagyl > 6/25  6/23 BCx - Neg 6/23 UCx - negative 6/24 GI panel - negative 6/24 mrsa pcr: neg 6/24 cdiff: cancelled 6/24 rvp: neg 6/24 legionella: neg 6/24 hiv: neg 6/24 strep pneumo: neg 6/25 TA - MSSA  Thank you for allowing pharmacy to be a part of this patient's care.  Vinnie LevelBenjamin Michaelpaul Apo, PharmD., BCPS Clinical Pharmacist Clinical phone for 09/11/17 until 3:30pm: [phone] If after 3:30pm, [facility] for [facility]specific pharmacist

## 2017-09-11 NOTE — Progress Notes (Signed)
Peripherally Inserted Central Catheter/Midline Placement  The IV Nurse has discussed with the patient and/or persons authorized to consent for the patient, the purpose of this procedure and the potential benefits and risks involved with this procedure.  The benefits include less needle sticks, lab draws from the catheter, and the patient may be discharged home with the catheter. Risks include, but not limited to, infection, bleeding, blood clot (thrombus formation), and puncture of an artery; nerve damage and irregular heartbeat and possibility to perform a PICC exchange if needed/ordered by physician.  Alternatives to this procedure were also discussed.  Bard Power PICC patient education guide, fact sheet on infection prevention and patient information card has been provided to patient /or left at bedside.    PICC/Midline Placement Documentation  PICC Triple Lumen 09/11/17 PICC Right Basilic 35 cm 0 cm (Active)  Indication for Insertion or Continuance of Line Limited venous access - need for IV therapy >5 days (PICC only) 09/11/2017  6:27 PM  Exposed Catheter (cm) 0 cm 09/11/2017  6:27 PM  Site Assessment Clean;Dry;Intact 09/11/2017  6:27 PM  Lumen #1 Status Flushed;Blood return noted 09/11/2017  6:27 PM  Lumen #2 Status Flushed;Blood return noted 09/11/2017  6:27 PM  Lumen #3 Status Flushed;Blood return noted 09/11/2017  6:27 PM  Dressing Type Transparent 09/11/2017  6:27 PM  Dressing Status Clean;Dry;Intact;Antimicrobial disc in place 09/11/2017  6:27 PM  Dressing Intervention New dressing 09/11/2017  6:27 PM  Dressing Change Due 09/18/17 09/11/2017  6:27 PM    Telephone Consent signed by Ronnie DerbySon    Aubreyana Saltz Albarece 09/11/2017, 6:30 PM

## 2017-09-11 NOTE — Progress Notes (Signed)
[facility] FOR AM LAB REPLACEMENT ONLY  The patient does [facility] based on the criteria listed below:   1. Is GFR >/= 40 ml/min? Yes.    Patient's GFR today is>60 2. Is urine output >/= 0.5 ml/kg/hr for the last 6 hours? Yes.   Patient's UOP is 1.0 ml/kg/hr 3. Is BUN < 60 mg/dL? Yes.    Patient's BUN today is 11 4. Abnormal electrolyte(s): K3.6.phos2.2. mg1.5 5. Ordered repletion with: per protocol 6. If a panic level lab has been reported, has the CCM MD in charge been notified? Yes.  .   Physician:  S Sommer,MD  Carla Bowen, Carla Bowen 09/11/2017 6:13 AM

## 2017-09-11 NOTE — Progress Notes (Signed)
Attempted Lt IJ and Lt [place] CVL placement.  Visualized vessel in both location with ultrasound.  Able to get vessel with needle with blood return.  Unable to thread guidewire in Lt IJ and unable to insert catheter in Lt [place].  Procedure aborted.  Will f/u CXR.  Will ask to have PICC line inserted.  Coralyn HellingVineet Varie Machamer, MD [facility] 09/11/2017, 2:04 PM

## 2017-09-11 NOTE — Progress Notes (Signed)
Bedside ultrasound performed to assess left pleural effusion.  Small left pleural effusion noted (appears to be free flowing), will not perform thoracentesis given small size of effusion and risk outweighing benefits.

## 2017-09-12 ENCOUNTER — Inpatient Hospital Stay (HOSPITAL_COMMUNITY): Payer: Medicare Other

## 2017-09-12 LAB — GLUCOSE, CAPILLARY
Glucose-Capillary: 110 mg/dL — ABNORMAL HIGH (ref 70–99)
Glucose-Capillary: 115 mg/dL — ABNORMAL HIGH (ref 70–99)
Glucose-Capillary: 117 mg/dL — ABNORMAL HIGH (ref 70–99)
Glucose-Capillary: 67 mg/dL — ABNORMAL LOW (ref 70–99)
Glucose-Capillary: 79 mg/dL (ref 70–99)
Glucose-Capillary: 82 mg/dL (ref 70–99)

## 2017-09-12 LAB — URINE CULTURE: Culture: NO GROWTH

## 2017-09-12 LAB — CBC
HCT: 23.2 % — ABNORMAL LOW (ref 36.0–46.0)
Hemoglobin: 7.2 g/dL — ABNORMAL LOW (ref 12.0–15.0)
MCH: 31.6 pg (ref 26.0–34.0)
MCHC: 31 g/dL (ref 30.0–36.0)
MCV: 101.8 fL — ABNORMAL HIGH (ref 78.0–100.0)
Platelets: 280 10*3/uL (ref 150–400)
RBC: 2.28 MIL/uL — ABNORMAL LOW (ref 3.87–5.11)
RDW: 15.6 % — ABNORMAL HIGH (ref 11.5–15.5)
WBC: 7.3 10*3/uL (ref 4.0–10.5)

## 2017-09-12 LAB — BASIC METABOLIC PANEL
Anion gap: 3 — ABNORMAL LOW (ref 5–15)
BUN: 13 mg/dL (ref 6–20)
CO2: 22 mmol/L (ref 22–32)
Calcium: 6.8 mg/dL — ABNORMAL LOW (ref 8.9–10.3)
Chloride: 115 mmol/L — ABNORMAL HIGH (ref 98–111)
Creatinine, Ser: 0.45 mg/dL (ref 0.44–1.00)
GFR calc Af Amer: >60 mL/min (ref >60)
GFR calc non Af Amer: >60 mL/min (ref >60)
Glucose, Bld: 124 mg/dL — ABNORMAL HIGH (ref 70–99)
Potassium: 3 mmol/L — ABNORMAL LOW (ref 3.5–5.1)
Sodium: 140 mmol/L (ref 135–145)

## 2017-09-12 LAB — TRIGLYCERIDES: Triglycerides: 482 mg/dL — ABNORMAL HIGH (ref <150)

## 2017-09-12 LAB — MAGNESIUM: Magnesium: 2.3 mg/dL (ref 1.7–2.4)

## 2017-09-12 LAB — OCCULT BLOOD X 1 CARD TO LAB, STOOL: Fecal Occult Bld: NEGATIVE

## 2017-09-12 LAB — PHOSPHORUS: Phosphorus: 2.7 mg/dL (ref 2.5–4.6)

## 2017-09-12 LAB — PROCALCITONIN: Procalcitonin: 2.04 ng/mL

## 2017-09-12 MED ORDER — CLONAZEPAM 0.5 MG PO TBDP
1.0000 mg | ORAL_TABLET | Freq: Two times a day (BID) | ORAL | Status: DC
  Administered 2017-09-12 – 2017-09-13 (×3): 1 mg
  Filled 2017-09-12 (×3): qty 2

## 2017-09-12 MED ORDER — POTASSIUM CHLORIDE 10 MEQ/50ML IV SOLN
10.0000 meq | INTRAVENOUS | Status: AC
  Administered 2017-09-12 (×4): 10 meq via INTRAVENOUS
  Filled 2017-09-12 (×4): qty 50

## 2017-09-12 MED ORDER — ARIPIPRAZOLE 5 MG PO TABS
5.0000 mg | ORAL_TABLET | Freq: Every day | ORAL | Status: DC
  Administered 2017-09-12 – 2017-09-25 (×14): 5 mg
  Filled 2017-09-12 (×15): qty 1

## 2017-09-12 MED ORDER — DEXTROSE 50 % IV SOLN
INTRAVENOUS | Status: AC
  Administered 2017-09-12: 50 mL
  Filled 2017-09-12: qty 50

## 2017-09-12 MED ORDER — VITAL AF 1.2 CAL PO LIQD
1000.0000 mL | ORAL | Status: DC
  Administered 2017-09-12: 1000 mL
  Filled 2017-09-12: qty 1000

## 2017-09-12 MED ORDER — PROPOFOL 1000 MG/100ML IV EMUL
5.0000 ug/kg/min | INTRAVENOUS | Status: DC
  Administered 2017-09-12: 50 ug/kg/min via INTRAVENOUS
  Administered 2017-09-12: 5 ug/kg/min via INTRAVENOUS
  Administered 2017-09-13 (×2): 50 ug/kg/min via INTRAVENOUS
  Filled 2017-09-12 (×4): qty 100

## 2017-09-12 MED ORDER — FENTANYL CITRATE (PF) 100 MCG/2ML IJ SOLN
100.0000 ug | INTRAMUSCULAR | Status: DC | PRN
  Administered 2017-09-13 – 2017-09-25 (×36): 100 ug via INTRAVENOUS
  Filled 2017-09-12 (×38): qty 2

## 2017-09-12 MED ORDER — CLONAZEPAM 0.1 MG/ML ORAL SUSPENSION
1.0000 mg | Freq: Two times a day (BID) | ORAL | Status: DC
  Filled 2017-09-12: qty 10

## 2017-09-12 MED ORDER — LAMOTRIGINE 5 MG PO CHEW
25.0000 mg | CHEWABLE_TABLET | Freq: Two times a day (BID) | ORAL | Status: DC
  Administered 2017-09-12 – 2017-09-19 (×15): 25 mg
  Filled 2017-09-12 (×16): qty 5

## 2017-09-12 MED ORDER — VITAL HIGH PROTEIN PO LIQD: 1000.0000 mL | ORAL | Status: DC

## 2017-09-12 MED ORDER — QUETIAPINE FUMARATE 50 MG PO TABS
50.0000 mg | ORAL_TABLET | Freq: Two times a day (BID) | ORAL | Status: DC
  Administered 2017-09-12 – 2017-09-15 (×7): 50 mg via ORAL
  Filled 2017-09-12 (×8): qty 1

## 2017-09-12 MED ORDER — SODIUM CHLORIDE 0.9 % IV SOLN
0.0000 ug/min | INTRAVENOUS | Status: DC
  Administered 2017-09-12: 13.333 ug/min via INTRAVENOUS
  Administered 2017-09-13: 40 ug/min via INTRAVENOUS
  Administered 2017-09-13 (×2): 50 ug/min via INTRAVENOUS
  Administered 2017-09-13: 60 ug/min via INTRAVENOUS
  Filled 2017-09-12: qty 10
  Filled 2017-09-12: qty 1
  Filled 2017-09-12 (×3): qty 10
  Filled 2017-09-12: qty 1
  Filled 2017-09-12: qty 10

## 2017-09-12 NOTE — Progress Notes (Signed)
..  Name: Carla Bowen MRN: [id number] DOB: 10/2Vonzell Schlatter9/1957    ADMISSION DATE:  09/03/2017 CONSULTATION DATE:  09/04/2017  REFERRING MD :  Pearson GrippeKIM, JAMES MD  CHIEF COMPLAINT:  Abdominal pain. S/p fall. Hypotension  BRIEF PATIENT DESCRIPTION:  62 yr old female with PMHx Anxiety, Depression, Fibromyalgia, High cholesterol, Hypertension, and CVA p/w abdominal pain, febrile and hypotensive. Received 3.5L IVF and remains hypotensive PCCM consulted for admission.  SUBJECTIVE:  Per nursing, intermittent episodes of agitation overnight, requiring versed pushes Having stools On PSV: 40%, 12/8 Afebrile On precedex and fentanyl gtt's  Neo-synephrine currently briefly turned off, but became hypotensive and restarted   VITAL SIGNS: Temp:  [97.1 F (36.2 C)-102.3 F (39.1 C)] 98.2 F (36.8 C) (07/02 0715) Pulse Rate:  [68-132] 116 (07/02 0720) Resp:  [18-30] 21 (07/02 0720) BP: (58-152)/(32-83) 119/71 (07/02 0720) SpO2:  [88 %-100 %] 100 % (07/02 0720) FiO2 (%):  [40 %] 40 % (07/02 0720) Weight:  [139 lb 1.8 oz (63.1 kg)] 139 lb 1.8 oz (63.1 kg) (07/02 0500)  PHYSICAL EXAMINATION:  General: Acutely ill appearing female, laying in bed, intubated, tolerating PSV trial, in no acute distress   HEENT: Atraumatic, normocephalic, Pupils PERRL 2 mm bilaterally, arouses to pain with purposful movements, MM pink/moist, ETT in place   Neuro: Sedated, arouses to pain with purposeful movement all extremities, with arousal becomes agitated with coughing  CV: Tachycardia, RRR, s1s2 noted, No M/R/G  PULM: Rhonchi & expiratory wheezing throughout, even, non-labored, vent assisted   GI: Soft, nontender, slightly distended, BS+ x4, passing stool  Extremities: Warm, no edema, no deformities  Skin: Warm, dry.  No obvious rashes, lesions, or ulcerations    Recent Labs  Lab 09/10/17 0256 09/11/17 0253 09/12/17 0431  NA 138 137 140  K 3.3* 3.6 3.0*  CL 102 103 115*  CO2 29 24 22   BUN 9 11 13     CREATININE 0.65 0.65 0.45  GLUCOSE 139* 155* 124*   Recent Labs  Lab 09/10/17 0256 09/11/17 0253 09/12/17 0431  HGB 9.7* 9.7* 7.2*  HCT 30.1* 30.4* 23.2*  WBC 11.6* 8.7 7.3  PLT 220 279 280    SIGNIFICANT EVENTS  6/23 admitted overnight/ hypotensive/ abd pain 6/24 worsening hypoxia -intubated that afternoon 6/26 > off pressors. Remains on vent. Imaging improving.  6/28 self extubated, failed immediately due to hypoxia. Emergently re-intubated.  7/1>> Fevers, increasing vasopressor requirements  STUDIES:  6/23 CT Ab/Pelv: Wall thickening of descending and proximal sigmoid colon is noted consistent with infectious or inflammatory colitis. Bilateral nonobstructive nephrolithiasis. Mild bilateral posterior basilar subsegmental atelectasis. 6/24 CXR >>New left-greater-than-right airspace disease, concerning for multifocal pneumonia or ARDS 6/24  AXR >> 1. Mildly dilated loops of small bowel throughout the abdomen with gas in the colon. Findings are compatible with adynamic ileus. 2. Thickening of the colonic wall compatible with colitis. 3. Bibasilar airspace disease compatible with multi lobar pneumonia versus ARDS 6/25 AXR >> Resolution of small bowel dilatation. Nasogastric catheter within the stomach. 6/24 TTE >>  Left ventricle: The cavity size was normal. Systolic function was   normal. The estimated ejection fraction was in the range of 55%   to 60%. Although no diagnostic regional wall motion abnormality  was identified, this possibility cannot be completely excluded on the basis of this study. Left ventricular diastolic function parameters were normal. 7/1 KUB>> Moderate gaseous distention of the colon. Moderately increased right colonic stool burden without evidence of obstruction of the small or large bowel.  Cultures:  6/23 BC x 2 >> neg 6/23 GI panel PCR >> neg 6/24 MRSA PCR >> neg 6/24 Resp panel >> neg 6/24 UC >> neg 6/24 urine strep >> neg 6/24 urine legionella >>  neg 6/25 trach aspirate >> pan-sensitive staph aureus.  7/1 Trach aspirate>> 7/1 Blood cultures x2>> 7/1 Urine>>  Antibiotics:  6/23 ceftriaxone >> 6/28 6/23 Flagyl >> 6/25 6/23 azithromycin >> 6/25 6/28 cefazolin>>>7/1 7/1 Vancomycin>> 7/1 Cefepime>>  Lines:  6/24 ETT >>6/28, 6/28>>> 6/24 OGT >> 6/24 Foley >> PIV  7/1 R PICC>>  ASSESSMENT / PLAN:  NEURO A: Acute metabolic encephalopathy  Delirium  Hx ETOH abuse - ?some degree of ETOH withdrawal - last ETOH use 6/23 P:  RASS goal: -1 to -2  Daily WUA D/c Fentanyl & Precedex gtts Initiate Propofol to maintain RASS goal, prn fentanyl and versed pushes Restart Lamictal, Ariprazole, Clonazepam, Seroquel Thiamine, Folate   CARDIOVASCULAR A: Septic Shock  Hypotension  Volume overload >>? Hypovolemia post diuresis 6/30 -Diuresed 4L 6/30 P:  ICU monitoring Neo-synephrine prn to maintain MAP >65 Hold home Lisinopril, rosuvastatin, Norvasc Hold off diuresis 7/2 Follow pan cultures Continue Vancomycin & Cefepime Follow EKG since adding antipsychotics  RESPIRATORY A: Acute hypoxic respiratory failure  ARDS  MSSA PNA  Small L pleural effusion (? Loculated)  P:  Vent support: ARDS Protocol/settings Wean Peep & FiO2 as tolerated Daily SBT VAP Bundle Follow CXR & ABG Pulmonary hygiene Control agitation - limiting factor to weaning Hold off on diuresis 7/3  INFECTIOUS A: Severe Sepsis >>worsening 7/1 PNA -suspected Aspiration given hx vomiting pan sensitive staph aureus on sputum cx 6/25 Fevers P:  Monitor fever curve Trend WBC's Trend PCT Follow Pan cultures Continue Vancomycin & Cefepime   ENDOCRINE A: Hypoglycemia > improved P:  CBG's q4h SSI   GI A: Ischemic colitis - improving  ?infectious colitis Ileus > improved - last BM 6/24 Protein calorie malnutrition P:  Surgery signed off -appreciate input Follow abdominal exam, if worsens will need to reconsult Bowel regimen Restart tube  feeds- Trickle feeds only 7/2 (no rate advancement), will reassess 7/3 for rate adjustment   HEMATOLOGIC A: Thrombocytopenia - resolved Anemia P:  Monitor for s/sx of bleeding Trend CBC Transfuse for Hgb<7 SQ heparin for VTE prophylaxis Check for occult stool    RENAL A: Acute Kidney Injury - resolved  Mild hypernatremia - resolved Hypokalemia P:  Monitor I&O's / urine output Trend BMP Ensure adequate renal perfusion Avoid nephrotoxic agents as able Replace electrolytes as indicated Repleting K IV 7/2, Follow up BMET in am 7/3   Family: No family at bedside 7/2.   Harlon Ditty, AGACNP-BC [facility] 09/12/2017  8:15 AM

## 2017-09-12 NOTE — Progress Notes (Signed)
Nutrition Follow-up / Consult  DOCUMENTATION CODES:   Not applicable  INTERVENTION:   Start trickle tube feedings via OGT:  Vital AF 1.2 at 20 ml/h to provide 576 kcal, 36 gm protein, 389 ml free water daily  When TF tolerance established, advance Vital AF 1.2 to goal rate of 45 ml/h to provide 1296 kcal (1346 kcal total with current Propofol), 81 gm protein, 876 ml free water daily  NUTRITION DIAGNOSIS:   Inadequate oral intake related to inability to eat as evidenced by NPO status.  Ongoing  GOAL:   Patient will meet greater than or equal to 90% of their needs  Unmet  MONITOR:   Vent status, Labs, I & O's, Weight trends  REASON FOR ASSESSMENT:   Consult Enteral/tube feeding initiation and management  ASSESSMENT:   62 year old female who presented to ED with abdominal pain, diarrhea, and weakness. PMH significant for EtOH abuse, anxiety, depression, hypertension, hyperlipidemia, CVA, and fibromyalgia.  Discussed patient in ICU rounds and with RN today. Received MD Consult for initiation of trickle tube feedings. Tolerance to be evaluated with potential for TF rate advancement tomorrow.   KUB on 7/1 showed gaseous distention of the colon, but no obstruction.   Patient remains intubated on ventilator support MV: 6 L/min Temp (24hrs), Avg:98.2 F (36.8 C), Min:97.1 F (36.2 C), Max:98.7 F (37.1 C)  Propofol: 1.9 ml/hr providing 50 kcal from lipid  Labs reviewed. Potassium 3 (L) CBG's: [phone] Medications reviewed and include folic acid, novolog, thiamine.   Diet Order:   Diet Order           Diet NPO time specified  Diet effective now          EDUCATION NEEDS:   No education needs have been identified at this time  Skin:  Skin Assessment: Reviewed RN Assessment  Last BM:  7/2 type 7  Height:   Ht Readings from Last 1 Encounters:  09/04/17 5\' 1"  (1.549 m)    Weight:   Wt Readings from Last 1 Encounters:  09/12/17 139 lb 1.8 oz (63.1  kg)    Ideal Body Weight:  47.7 kg  BMI:  Body mass index is 26.28 kg/m.  Estimated Nutritional Needs:   Kcal:  1275 kcal/day  Protein:  75-90 grams/day  Fluid:  1.7 L/day    Joaquin CourtsKimberly Brailyn Delman, RD, LDN, CNSC Pager [phone] After Hours Pager [phone]

## 2017-09-13 ENCOUNTER — Inpatient Hospital Stay (HOSPITAL_COMMUNITY): Payer: Medicare Other

## 2017-09-13 LAB — BLOOD GAS, ARTERIAL
Acid-base deficit: 2.4 mmol/L — ABNORMAL HIGH (ref 0.0–2.0)
Bicarbonate: 21.5 mmol/L (ref 20.0–28.0)
Drawn by: 330991
FIO2: 40
MECHVT: 380 mL
O2 Saturation: 96.5 %
PEEP: 8 cmH2O
Patient temperature: 98.6
RATE: 14 resp/min
pCO2 arterial: 34.4 mmHg (ref 32.0–48.0)
pH, Arterial: 7.412 (ref 7.350–7.450)
pO2, Arterial: 84.6 mmHg (ref 83.0–108.0)

## 2017-09-13 LAB — BASIC METABOLIC PANEL
Anion gap: 7 (ref 5–15)
BUN: 8 mg/dL (ref 6–20)
CO2: 19 mmol/L — ABNORMAL LOW (ref 22–32)
Calcium: 7.2 mg/dL — ABNORMAL LOW (ref 8.9–10.3)
Chloride: 109 mmol/L (ref 98–111)
Creatinine, Ser: 0.49 mg/dL (ref 0.44–1.00)
GFR calc Af Amer: >60 mL/min (ref >60)
GFR calc non Af Amer: >60 mL/min (ref >60)
Glucose, Bld: 133 mg/dL — ABNORMAL HIGH (ref 70–99)
Potassium: 3.4 mmol/L — ABNORMAL LOW (ref 3.5–5.1)
Sodium: 135 mmol/L (ref 135–145)

## 2017-09-13 LAB — GLUCOSE, CAPILLARY
Glucose-Capillary: 101 mg/dL — ABNORMAL HIGH (ref 70–99)
Glucose-Capillary: 101 mg/dL — ABNORMAL HIGH (ref 70–99)
Glucose-Capillary: 106 mg/dL — ABNORMAL HIGH (ref 70–99)
Glucose-Capillary: 108 mg/dL — ABNORMAL HIGH (ref 70–99)
Glucose-Capillary: 129 mg/dL — ABNORMAL HIGH (ref 70–99)
Glucose-Capillary: 89 mg/dL (ref 70–99)
Glucose-Capillary: 93 mg/dL (ref 70–99)

## 2017-09-13 LAB — CBC
HCT: 23 % — ABNORMAL LOW (ref 36.0–46.0)
Hemoglobin: 7.5 g/dL — ABNORMAL LOW (ref 12.0–15.0)
MCH: 33.8 pg (ref 26.0–34.0)
MCHC: 32.6 g/dL (ref 30.0–36.0)
MCV: 103.6 fL — ABNORMAL HIGH (ref 78.0–100.0)
Platelets: 356 10*3/uL (ref 150–400)
RBC: 2.22 MIL/uL — ABNORMAL LOW (ref 3.87–5.11)
RDW: 15.9 % — ABNORMAL HIGH (ref 11.5–15.5)
WBC: 7.5 10*3/uL (ref 4.0–10.5)

## 2017-09-13 LAB — CULTURE, RESPIRATORY W GRAM STAIN: Culture: NORMAL

## 2017-09-13 LAB — PROCALCITONIN: Procalcitonin: 0.72 ng/mL

## 2017-09-13 LAB — TRIGLYCERIDES: Triglycerides: 910 mg/dL — ABNORMAL HIGH (ref <150)

## 2017-09-13 MED ORDER — DEXMEDETOMIDINE HCL IN NACL 400 MCG/100ML IV SOLN
0.0000 ug/kg/h | INTRAVENOUS | Status: AC
  Administered 2017-09-13: 0.4 ug/kg/h via INTRAVENOUS
  Administered 2017-09-13: 0.6 ug/kg/h via INTRAVENOUS
  Administered 2017-09-14: 1 ug/kg/h via INTRAVENOUS
  Administered 2017-09-14: 0.6 ug/kg/h via INTRAVENOUS
  Administered 2017-09-14: 1.2 ug/kg/h via INTRAVENOUS
  Administered 2017-09-15 (×2): 0.9 ug/kg/h via INTRAVENOUS
  Administered 2017-09-15 – 2017-09-16 (×3): 1 ug/kg/h via INTRAVENOUS
  Filled 2017-09-13 (×11): qty 100

## 2017-09-13 MED ORDER — MIDAZOLAM HCL 50 MG/10ML IJ SOLN
0.0000 mg/h | INTRAMUSCULAR | Status: DC
  Administered 2017-09-13: 1 mg/h via INTRAVENOUS
  Administered 2017-09-13: 7 mg/h via INTRAVENOUS
  Administered 2017-09-13: 5 mg/h via INTRAVENOUS
  Administered 2017-09-14: 2 mg/h via INTRAVENOUS
  Filled 2017-09-13 (×4): qty 10

## 2017-09-13 MED ORDER — POTASSIUM CHLORIDE 20 MEQ/15ML (10%) PO SOLN
40.0000 meq | Freq: Once | ORAL | Status: AC
  Administered 2017-09-13: 40 meq
  Filled 2017-09-13: qty 30

## 2017-09-13 MED ORDER — VITAMIN B-1 100 MG PO TABS
100.0000 mg | ORAL_TABLET | Freq: Every day | ORAL | Status: DC
  Administered 2017-09-14 – 2017-09-25 (×12): 100 mg
  Filled 2017-09-13 (×12): qty 1

## 2017-09-13 MED ORDER — FOLIC ACID 1 MG PO TABS
1.0000 mg | ORAL_TABLET | Freq: Every day | ORAL | Status: DC
  Administered 2017-09-14 – 2017-09-25 (×12): 1 mg
  Filled 2017-09-13 (×12): qty 1

## 2017-09-13 MED ORDER — CLONAZEPAM 0.5 MG PO TBDP
1.0000 mg | ORAL_TABLET | Freq: Four times a day (QID) | ORAL | Status: DC
  Administered 2017-09-13 – 2017-09-19 (×24): 1 mg
  Filled 2017-09-13 (×25): qty 2

## 2017-09-13 MED ORDER — VECURONIUM BROMIDE 10 MG IV SOLR
10.0000 mg | Freq: Once | INTRAVENOUS | Status: AC
  Administered 2017-09-14: 10 mg via INTRAVENOUS

## 2017-09-13 MED ORDER — ETOMIDATE 2 MG/ML IV SOLN
40.0000 mg | Freq: Once | INTRAVENOUS | Status: AC
  Administered 2017-09-14: 20 mg via INTRAVENOUS

## 2017-09-13 MED ORDER — FENTANYL CITRATE (PF) 100 MCG/2ML IJ SOLN: 200.0000 ug | Freq: Once | INTRAMUSCULAR | Status: DC

## 2017-09-13 MED ORDER — MIDAZOLAM BOLUS VIA INFUSION
1.0000 mg | INTRAVENOUS | Status: DC | PRN
  Administered 2017-09-14 – 2017-09-17 (×9): 2 mg via INTRAVENOUS
  Filled 2017-09-13: qty 2

## 2017-09-13 MED ORDER — VITAL AF 1.2 CAL PO LIQD
1000.0000 mL | ORAL | Status: DC
  Administered 2017-09-15 – 2017-09-22 (×7): 1000 mL
  Filled 2017-09-13 (×4): qty 1000

## 2017-09-13 MED ORDER — MIDAZOLAM HCL 2 MG/2ML IJ SOLN: 4.0000 mg | Freq: Once | INTRAMUSCULAR | Status: DC

## 2017-09-13 MED ORDER — PROPOFOL 500 MG/50ML IV EMUL: 5.0000 ug/kg/min | Freq: Once | INTRAVENOUS | Status: DC

## 2017-09-13 MED ORDER — FAMOTIDINE 40 MG/5ML PO SUSR
20.0000 mg | Freq: Two times a day (BID) | ORAL | Status: DC
  Administered 2017-09-13 – 2017-09-25 (×24): 20 mg
  Filled 2017-09-13 (×25): qty 2.5

## 2017-09-13 NOTE — Progress Notes (Signed)
Tracheostomy is scheduled for Friday 09/15/17 at 11:00 am.  Called and updated pt's son Carla Bowen of procedure date.  Carla Bowen is in agreement for proceeding with Tracheostomy at scheduled time.

## 2017-09-13 NOTE — Plan of Care (Signed)
  Problem: Clinical Measurements: Goal: Will remain free from infection Outcome: Progressing   Problem: Nutrition: Goal: Adequate nutrition will be maintained Outcome: Progressing   Problem: Pain Managment: Goal: General experience of comfort will improve Outcome: Progressing   Problem: Safety: Goal: Ability to remain free from injury will improve Outcome: Progressing   Problem: Skin Integrity: Goal: Risk for impaired skin integrity will decrease Outcome: Progressing   

## 2017-09-13 NOTE — Progress Notes (Signed)
Nutrition Follow-up  DOCUMENTATION CODES:   Not applicable  INTERVENTION:    Increase Vital AF 1.2 by 10 ml every 4 hours to goal rate of 45 ml/h to provide 1296 kcal, 81 gm protein, 876 ml free water daily  NUTRITION DIAGNOSIS:   Inadequate oral intake related to inability to eat as evidenced by NPO status.  Ongoing  GOAL:   Patient will meet greater than or equal to 90% of their needs  Unmet  MONITOR:   Vent status, Labs, I & O's, Weight trends  ASSESSMENT:   62 year old female who presented to ED with abdominal pain, diarrhea, and weakness. PMH significant for EtOH abuse, anxiety, depression, hypertension, hyperlipidemia, CVA, and fibromyalgia.  Discussed patient in ICU rounds and with RN today. Trickle tube feeding was started yesterday. Tolerating well; increased stooling this morning per discussion with RN.  Spoke with CCM physician; okay to advance TF to goal rate.   Plans for trach later this week or early next week.  Patient remains intubated on ventilator support MV: 10.7 L/min Temp (24hrs), Avg:98.5 F (36.9 C), Min:97.9 F (36.6 C), Max:99.6 F (37.6 C)  Propofol off d/t increased triglycerides. Labs reviewed. Potassium 3.4 (L), triglycerides 910 (H) CBG's: 106-108-93-89 Medications reviewed.   Diet Order:   Diet Order           Diet NPO time specified  Diet effective midnight        Diet NPO time specified  Diet effective now          EDUCATION NEEDS:   No education needs have been identified at this time  Skin:  Skin Assessment: Reviewed RN Assessment  Last BM:  7/3 type 7  Height:   Ht Readings from Last 1 Encounters:  09/04/17 5\' 1"  (1.549 m)    Weight:   Wt Readings from Last 1 Encounters:  09/13/17 136 lb 11 oz (62 kg)    Ideal Body Weight:  47.7 kg  BMI:  Body mass index is 25.83 kg/m.  Estimated Nutritional Needs:   Kcal:  1275 kcal/day  Protein:  75-90 grams/day  Fluid:  1.7 L/day    Joaquin CourtsKimberly Lakitha Gordy, RD,  LDN, CNSC Pager [phone] After Hours Pager [phone]

## 2017-09-13 NOTE — Progress Notes (Signed)
Patient transported from 2M07 to CT and back via ventilator without complications.

## 2017-09-13 NOTE — Progress Notes (Signed)
Increased ps to 18 d/t RR 40's.  RR decreased to 33 on 18 psv.

## 2017-09-13 NOTE — Progress Notes (Signed)
Spoke with pt's son Carla Bowen, and updated him on his mother's status, and difficulty with weaning from the vent.  Today is day 9 on vent.  Discussed need for Tracheostomy in near future if he wishes to proceed with aggressive measures given vent weaning difficultly.  [name] gives consent to obtain Tracheostomy.  Will speak with Dr. Molli KnockYacoub about scheduling Trach either the end of this week of early next week.

## 2017-09-13 NOTE — Progress Notes (Addendum)
..  Name: Carla Bowen MRN: [id number] DOB: 09-Jun-1955    ADMISSION DATE:  09/03/2017 CONSULTATION DATE:  09/04/2017  REFERRING MD :  Pearson Grippe MD  CHIEF COMPLAINT:  Abdominal pain. S/p fall. Hypotension  BRIEF PATIENT DESCRIPTION:  62 yr old female with PMHx Anxiety, Depression, Fibromyalgia, High cholesterol, Hypertension, and CVA p/w abdominal pain, febrile and hypotensive. Received 3.5L IVF and remains hypotensive PCCM consulted for admission. Pt with VDRF in setting of MSSA PNA and ARDS, and septic shock.  SUBJECTIVE:  No acute events overnight On PSV trial: 12/8, 40% 60 mcg Neosynephrine Sedated with propofol Afebrile Purposeful movement of bilateral R extremities and LLE, no movement LUE   VITAL SIGNS: Temp:  [98 F (36.7 C)-99.6 F (37.6 C)] 99 F (37.2 C) (07/03 0725) Pulse Rate:  [76-137] 108 (07/03 0725) Resp:  [13-31] 15 (07/03 0725) BP: (70-155)/(38-134) 100/79 (07/03 0725) SpO2:  [90 %-100 %] 100 % (07/03 0742) FiO2 (%):  [40 %] 40 % (07/03 0743) Weight:  [136 lb 11 oz (62 kg)] 136 lb 11 oz (62 kg) (07/03 0500)  PHYSICAL EXAMINATION:  General: Acutely ill appearing female, laying in bed, intubated, currently tolerating PSV, in no acute distress    HEENT: Atraumatic, normocephalic, Pupils PERRL 2 mm bilaterally, purposeful movement to Right extremities and LLE, no movement to LUE, MM pink/moist, ETT in place Atraumatic, normocephalic, Pupils PERRL 2 mm bilaterally, arouses to pain with purposful movements, MM pink/moist, ETT in place   Neuro: Sedated, purposeful movement to R upper and lower extremity and LLE, no movement to LUE, Pupils PERRL 2 mm sluggish bilateral CV: Tachycardia, RRR, s1s2 noted, No M/R/G, palpable pulses throughout PULM: Rhonchi & expiratory wheezing bilaterally, even, non-labored, on PSV    GI: Soft, nontender, slightly distended, BS+ x4, passing liquid stool Extremities: Warm, no edema. No deformities  Skin: Warm, dry.  No obvious  rashes, lesions, or ulcerations     Recent Labs  Lab 09/11/17 0253 09/12/17 0431 09/13/17 0440  NA 137 140 135  K 3.6 3.0* 3.4*  CL 103 115* 109  CO2 24 22 19*  BUN 11 13 8   CREATININE 0.65 0.45 0.49  GLUCOSE 155* 124* 133*   Recent Labs  Lab 09/11/17 0253 09/12/17 0431 09/13/17 0440  HGB 9.7* 7.2* 7.5*  HCT 30.4* 23.2* 23.0*  WBC 8.7 7.3 7.5  PLT 279 280 356    SIGNIFICANT EVENTS  6/23 admitted overnight/ hypotensive/ abd pain 6/24 worsening hypoxia -intubated that afternoon 6/26 > off pressors. Remains on vent. Imaging improving.  6/28 self extubated, failed immediately due to hypoxia. Emergently re-intubated.  7/1>> Fevers, increasing vasopressor requirements 7/3>> LUE weakness  STUDIES:  6/23 CT Ab/Pelv: Wall thickening of descending and proximal sigmoid colon is noted consistent with infectious or inflammatory colitis. Bilateral nonobstructive nephrolithiasis. Mild bilateral posterior basilar subsegmental atelectasis. 6/24 CXR >>New left-greater-than-right airspace disease, concerning for multifocal pneumonia or ARDS 6/24  AXR >> 1. Mildly dilated loops of small bowel throughout the abdomen with gas in the colon. Findings are compatible with adynamic ileus. 2. Thickening of the colonic wall compatible with colitis. 3. Bibasilar airspace disease compatible with multi lobar pneumonia versus ARDS 6/25 AXR >> Resolution of small bowel dilatation. Nasogastric catheter within the stomach. 6/24 TTE >>  Left ventricle: The cavity size was normal. Systolic function was   normal. The estimated ejection fraction was in the range of 55%   to 60%. Although no diagnostic regional wall motion abnormality  was identified, this possibility cannot  be completely excluded on the basis of this study. Left ventricular diastolic function parameters were normal. 7/1 KUB>> Moderate gaseous distention of the colon. Moderately increased right colonic stool burden without evidence of obstruction  of the small or large bowel.  Cultures:  6/23 BC x 2 >> neg 6/23 GI panel PCR >> neg 6/24 MRSA PCR >> neg 6/24 Resp panel >> neg 6/24 UC >> neg 6/24 urine strep >> neg 6/24 urine legionella >> neg 6/25 trach aspirate >> pan-sensitive staph aureus.  7/1 Trach aspirate>> normal flora 7/1 Blood cultures x2>> 7/1 Urine>> negative  Antibiotics:  6/23 ceftriaxone >> 6/28 6/23 Flagyl >> 6/25 6/23 azithromycin >> 6/25 6/28 cefazolin>>>7/1 7/1 Vancomycin>> 7/1 Cefepime>>  Lines:  6/24 ETT >>6/28, 6/28>>> 6/24 OGT >> 6/24 Foley >> PIV  7/1 R PICC>>  ASSESSMENT / PLAN:  NEURO A: Acute metabolic encephalopathy  Delirium  LUE weakness Hx ETOH abuse - ?some degree of ETOH withdrawal - last ETOH use 6/23 P:  RASS goal: -1 to -2 Daily WUA Will d/c propofol given Triglycerides in 900's, will add Versed gtt to maintain RASS goal Prn fentanyl and versed pushes Continue Lamictal, Ariprazole, Clonazepam, Seroquel Thiamine, Folate  Obtain CT Head wo Contrast STAT 7/3  CARDIOVASCULAR A: Septic Shock  Hypotension  Volume overload >>? Hypovolemia post diuresis 6/30 -Diuresed 4L 6/30 P:  ICU Monitoring Neosynephrine gtt prn to maintain MAP >65 Continue holding home Lisinopril, Rosuvastatin, Norvasc Hold off diuresis 7/3 Follow pan cultures Continue Vancomycin & Cefepime  RESPIRATORY A: Acute hypoxic respiratory failure  ARDS  MSSA PNA  Small pleural effusions  P:  Vent support: ARDS Protocol/settings Wean Peep& FiO2 as tolerated Daily SBT as tolerated VAP Bundle Follow CXR Pulmonary hygiene Control agitation - will be limiting factor to weaning Hold off on diuresis 7/3   INFECTIOUS A: Severe Sepsis  PNA -suspected Aspiration given hx vomiting pan sensitive staph aureus on sputum cx 6/25 Fevers>>resolved P:  Monitor fever curve Trend WBC's Trend PCT Follow pan cultures Continue Vancomycin & Cefepime   ENDOCRINE A: Hypoglycemia >  resolved Hypertriglyceridemia in setting of propfol P:  CBG's q4h SSI D/c Propofol gtt Follow Triglycerides  GI A: Ischemic colitis - improving  ?infectious colitis Ileus > improved - last BM 6/24 Protein calorie malnutrition P:  Surgery and GI signed off - appreciate input Continue to follow abdominal exam Bowel Regimen Continue tube feeds, advance as tolerated, rate adjustment per Dietician   HEMATOLOGIC A: Thrombocytopenia - resolved Anemia -Fecal occult blood negative P:  Monitor for s/sx of bleeding Trend CBC SQ Heparin for VTE prophylaxis Transfuse for Hgb<7    RENAL A: Acute Kidney Injury - resolved  Mild hypernatremia - resolved Hypokalemia Non-Anion Gap Metabolic acidosis, likely in setting of diarrhea P:  Monitor I&O's / urine output Trend BMP Ensure adequate renal perfusion Avoid nephrotoxic agents as able Replace electrolytes as indicated Replete K 7/3, follow up BMP in am 7/4   Family: No family at bedside 7/3.   Harlon DittyJeremiah Kaisha Wachob, AGACNP-BC [facility] 09/13/2017  8:18 AM

## 2017-09-14 ENCOUNTER — Inpatient Hospital Stay (HOSPITAL_COMMUNITY): Payer: Medicare Other

## 2017-09-14 DIAGNOSIS — J9600 Acute respiratory failure, unspecified whether with hypoxia or hypercapnia: Secondary | ICD-10-CM

## 2017-09-14 DIAGNOSIS — N179 Acute kidney failure, unspecified: Secondary | ICD-10-CM

## 2017-09-14 LAB — BASIC METABOLIC PANEL
Anion gap: 6 (ref 5–15)
BUN: 9 mg/dL (ref 6–20)
CO2: 24 mmol/L (ref 22–32)
Calcium: 7.8 mg/dL — ABNORMAL LOW (ref 8.9–10.3)
Chloride: 111 mmol/L (ref 98–111)
Creatinine, Ser: 0.51 mg/dL (ref 0.44–1.00)
GFR calc Af Amer: >60 mL/min (ref >60)
GFR calc non Af Amer: >60 mL/min (ref >60)
Glucose, Bld: 150 mg/dL — ABNORMAL HIGH (ref 70–99)
Potassium: 3.5 mmol/L (ref 3.5–5.1)
Sodium: 141 mmol/L (ref 135–145)

## 2017-09-14 LAB — GLUCOSE, CAPILLARY
Glucose-Capillary: 124 mg/dL — ABNORMAL HIGH (ref 70–99)
Glucose-Capillary: 141 mg/dL — ABNORMAL HIGH (ref 70–99)
Glucose-Capillary: 116 mg/dL — ABNORMAL HIGH (ref 70–99)
Glucose-Capillary: 152 mg/dL — ABNORMAL HIGH (ref 70–99)
Glucose-Capillary: 98 mg/dL (ref 70–99)

## 2017-09-14 LAB — CBC
HCT: 24.6 % — ABNORMAL LOW (ref 36.0–46.0)
Hemoglobin: 7.5 g/dL — ABNORMAL LOW (ref 12.0–15.0)
MCH: 31 pg (ref 26.0–34.0)
MCHC: 30.5 g/dL (ref 30.0–36.0)
MCV: 101.7 fL — ABNORMAL HIGH (ref 78.0–100.0)
Platelets: 391 10*3/uL (ref 150–400)
RBC: 2.42 MIL/uL — ABNORMAL LOW (ref 3.87–5.11)
RDW: 15.7 % — ABNORMAL HIGH (ref 11.5–15.5)
WBC: 5.4 10*3/uL (ref 4.0–10.5)

## 2017-09-14 LAB — PROTIME-INR
INR: 1.11
Prothrombin Time: 14.2 seconds (ref 11.4–15.2)

## 2017-09-14 LAB — PROCALCITONIN: Procalcitonin: 0.54 ng/mL

## 2017-09-14 MED ORDER — PROPOFOL 500 MG/50ML IV EMUL
INTRAVENOUS | Status: AC
  Filled 2017-09-14: qty 50

## 2017-09-14 MED ORDER — ETOMIDATE 2 MG/ML IV SOLN
INTRAVENOUS | Status: AC
  Administered 2017-09-14: 20 mg via INTRAVENOUS
  Filled 2017-09-14: qty 20

## 2017-09-14 MED ORDER — PHENYLEPHRINE HCL-NACL 40-0.9 MG/250ML-% IV SOLN
0.0000 ug/min | INTRAVENOUS | Status: DC
  Administered 2017-09-14: 50 ug/min via INTRAVENOUS
  Administered 2017-09-14: 25 ug/min via INTRAVENOUS
  Administered 2017-09-16: 15 ug/min via INTRAVENOUS
  Filled 2017-09-14 (×3): qty 250

## 2017-09-14 MED ORDER — STERILE WATER FOR INJECTION IJ SOLN
INTRAMUSCULAR | Status: AC
  Administered 2017-09-14: 10 mL
  Filled 2017-09-14: qty 10

## 2017-09-14 MED ORDER — VECURONIUM BROMIDE 10 MG IV SOLR
INTRAVENOUS | Status: AC
  Administered 2017-09-14: 10 mg via INTRAVENOUS
  Filled 2017-09-14: qty 10

## 2017-09-14 MED ORDER — PHENYLEPHRINE HCL-NACL 40-0.9 MG/250ML-% IV SOLN: 0.0000 ug/min | INTRAVENOUS | Status: DC

## 2017-09-14 MED ORDER — POTASSIUM CHLORIDE 20 MEQ/15ML (10%) PO SOLN
20.0000 meq | ORAL | Status: AC
  Administered 2017-09-14 (×2): 20 meq
  Filled 2017-09-14 (×2): qty 15

## 2017-09-14 NOTE — Procedures (Signed)
Percutaneous Tracheostomy Placement  Consent from family.  Patient sedated, paralyzed and position.  Placed on 100% FiO2 and RR matched.  Area cleaned and draped.  Lidocaine/epi injected.  Skin incision done followed by blunt dissection.  Trachea palpated then punctured, catheter passed and visualized bronchoscopically.  Wire placed and visualized.  Catheter removed.  Airway then entered and dilated.  Size 6 cuffed shiley trach placed and visualized bronchoscopically well above carina.  Good volume returns.  Patient tolerated the procedure well without complications.  Minimal blood loss.  CXR ordered and pending.  Mirinda Monte G. Jolan Mealor, M.D. [facility]. Pager: [phone]. After hours pager: [phone].  

## 2017-09-14 NOTE — Progress Notes (Signed)
Pharmacy Antibiotic Note  Carla Bowen is a 62 y.o. female admitted on 09/03/2017 with sepsis.  Pharmacy dosing cefepime. Vancomycin was d/c'ed yesterday. On D#4 of antibiotics. WBC remains normal. Pressor requirements decreasing.   Plan: -Continue cefepime 1 gm IV Q 8 hours -Monitor CBC, renal fx, cultures and clinical progress   Height: 5\' 1"  (154.9 cm) Weight: 135 lb 9.3 oz (61.5 kg) IBW/kg (Calculated) : 47.8  Temp (24hrs), Avg:99.2 F (37.3 C), Min:97.9 F (36.6 C), Max:100.2 F (37.9 C)  Recent Labs  Lab 09/10/17 0256 09/11/17 0253 09/12/17 0431 09/13/17 0440 09/14/17 0413  WBC 11.6* 8.7 7.3 7.5 5.4  CREATININE 0.65 0.65 0.45 0.49 0.51    Estimated Creatinine Clearance: 63.7 mL/min (by C-G formula based on SCr of 0.51 mg/dL).    Allergies  Allergen Reactions  . Ciprofloxacin Itching, Nausea And Vomiting and Rash  . Codeine Hives  . Sulfa Antibiotics Hives  . Tramadol Hives    7/1 Cefepime >> 7/1 Vanc>> 7/3 6/28 cefazolin > 7/1 6/23 azithro > 6/26 6/23 ceftriaxone > (6/30) 6/23 flagyl > 6/25  6/23 BCx - Neg 6/23 UCx - negative 6/24 GI panel - negative 6/24 mrsa pcr: neg 6/24 cdiff: cancelled 6/24 rvp: neg 6/24 legionella: neg 6/24 hiv: neg 6/24 strep pneumo: neg 6/25 TA - MSSA  Thank you for allowing pharmacy to be a part of this patient's care.  Vinnie LevelBenjamin Jakwan Sally, PharmD., BCPS Clinical Pharmacist Clinical phone for 09/13/17 until 3:30pm: [phone] If after 3:30pm, please [facility] for unit-specific pharmacist

## 2017-09-14 NOTE — Progress Notes (Signed)
PCCM Video Bronchoscopy Procedure Note  The patient was informed of the risks (including but not limited to bleeding, infection, respiratory failure, lung injury, tooth/oral injury) and benefits of the procedure and gave consent, see chart.  Indication: visualization for tracheostomy  Post Procedure Diagnosis: acute respiratory failure, on vent  Location: [place] ICU  Condition pre procedure: critically ill, on vent  Medications for procedure: versed, propofol, vecuronium, etomidate  Procedure description: The bronchoscope was introduced through the endotracheal tube and passed to the bilateral lungs to the level of the subsegmental bronchi throughout the tracheobronchial tree.  Airway exam revealed normal airway mucosa without lesion. The carina was sharp, there were no masses.  The scope was used to guide retract the endotracheal tube and for visualization for the tracheostomy.  Procedures performed:  none  Specimens sent: none  Condition post procedure: none  EBL: none  Complications: none  Heber CarolinaBrent Tiki Tucciarone, MD [place] [facility] Pager: [phone] Cell: [phone] After 3pm or if no response, call [phone]

## 2017-09-14 NOTE — Progress Notes (Signed)
[facility] FOR AM LAB REPLACEMENT ONLY  The patient does apply for the [facility] Electrolyte [facility] Protocol based on the criteria listed below:   1. Is GFR >/= 40 ml/min? Yes.    Patient's GFR today is >60 2. Is urine output >/= 0.5 ml/kg/hr for the last 6 hours? Yes.   Patient's UOP is 1.3 ml/kg/hr 3. Is BUN < 60 mg/dL? Yes.    Patient's BUN today is 9 4. Abnormal electrolyte(s): K-3.5 5. Ordered repletion with: per protocol 6. If a panic level lab has been reported, has the CCM MD in charge been notified? Yes.  .   Physician:  Dr Burman FreestoneSimonds  Carla Bowen, Carla BoosMaria Bowen 09/14/2017 5:03 AM

## 2017-09-14 NOTE — Progress Notes (Signed)
RT note: Attempted AM SBT on patient this AM however patient began to have increased respiratory rate into the high 30s.  Placed patient on full support ventilation.  Tolerating well.  Will continue to monitor.

## 2017-09-14 NOTE — Progress Notes (Addendum)
..  Name: Carla Bowen MRN: [id number] DOB: February 14, 1956    ADMISSION DATE:  09/03/2017 CONSULTATION DATE:  09/04/2017  REFERRING MD :  Pearson Grippe MD  CHIEF COMPLAINT:  Abdominal pain. S/p fall. Hypotension  BRIEF PATIENT DESCRIPTION:  62 yr old female with PMHx Anxiety, Depression, Fibromyalgia, High cholesterol, Hypertension, and CVA p/w abdominal pain, febrile and hypotensive. Received 3.5L IVF and remains hypotensive PCCM consulted for admission. Pt with VDRF in setting of MSSA PNA and ARDS, and septic shock.  SUBJECTIVE:  Requiring heavy sedation, remains on low-dose Neo-Synephrine  VITAL SIGNS: Temp:  [97.9 F (36.6 C)-100.2 F (37.9 C)] 99.5 F (37.5 C) (07/04 0344) Pulse Rate:  [76-175] 110 (07/04 0600) Resp:  [18-37] 25 (07/04 0600) BP: (86-155)/(43-120) 109/69 (07/04 0600) SpO2:  [65 %-100 %] 65 % (07/04 0600) FiO2 (%):  [40 %] 40 % (07/04 0400) Weight:  [135 lb 9.3 oz (61.5 kg)] 135 lb 9.3 oz (61.5 kg) (07/04 0500)  PHYSICAL EXAMINATION:  General: Sedate, not following commands HEENT: Endotracheal tube to ventilator, gastric tube tube feeds Neuro: Currently heavily sedated, reportedly agitated at times uncooperative CV: Heart sounds are regular rate and rhythm PULM: even/non-labored, lungs bilaterally coarse rhonchi decreased breath sounds in the bases WU:JWJX, non-tender, bsx4 active  Extremities: warm/dry, 1+ edema  Skin: no rashes or lesions  Recent Labs  Lab 09/12/17 0431 09/13/17 0440 09/14/17 0413  NA 140 135 141  K 3.0* 3.4* 3.5  CL 115* 109 111  CO2 22 19* 24  BUN 13 8 9   CREATININE 0.45 0.49 0.51  GLUCOSE 124* 133* 150*   Recent Labs  Lab 09/12/17 0431 09/13/17 0440 09/14/17 0413  HGB 7.2* 7.5* 7.5*  HCT 23.2* 23.0* 24.6*  WBC 7.3 7.5 5.4  PLT 280 356 391    SIGNIFICANT EVENTS  6/23 admitted overnight/ hypotensive/ abd pain 6/24 worsening hypoxia -intubated that afternoon 6/26 > off pressors. Remains on vent. Imaging improving.    6/28 self extubated, failed immediately due to hypoxia. Emergently re-intubated.  7/1>> Fevers, increasing vasopressor requirements 7/3>> LUE weakness  STUDIES:  6/23 CT Ab/Pelv: Wall thickening of descending and proximal sigmoid colon is noted consistent with infectious or inflammatory colitis. Bilateral nonobstructive nephrolithiasis. Mild bilateral posterior basilar subsegmental atelectasis. 6/24 CXR >>New left-greater-than-right airspace disease, concerning for multifocal pneumonia or ARDS 6/24  AXR >> 1. Mildly dilated loops of small bowel throughout the abdomen with gas in the colon. Findings are compatible with adynamic ileus. 2. Thickening of the colonic wall compatible with colitis. 3. Bibasilar airspace disease compatible with multi lobar pneumonia versus ARDS 6/25 AXR >> Resolution of small bowel dilatation. Nasogastric catheter within the stomach. 6/24 TTE >>  Left ventricle: The cavity size was normal. Systolic function was   normal. The estimated ejection fraction was in the range of 55%   to 60%. Although no diagnostic regional wall motion abnormality  was identified, this possibility cannot be completely excluded on the basis of this study. Left ventricular diastolic function parameters were normal. 7/1 KUB>> Moderate gaseous distention of the colon. Moderately increased right colonic stool burden without evidence of obstruction of the small or large bowel.  Cultures:  6/23 BC x 2 >> neg 6/23 GI panel PCR >> neg 6/24 MRSA PCR >> neg 6/24 Resp panel >> neg 6/24 UC >> neg 6/24 urine strep >> neg 6/24 urine legionella >> neg 6/25 trach aspirate >> pan-sensitive staph aureus.  7/1 Trach aspirate>> normal flora 7/1 Blood cultures x2>> 7/1 Urine>> negative  Antibiotics:  6/23 ceftriaxone >> 6/28 6/23 Flagyl >> 6/25 6/23 azithromycin >> 6/25 6/28 cefazolin>>>7/1 7/1 Vancomycin>> 09/13/2017 7/1 Cefepime>>  Lines:  6/24 ETT >>6/28, 6/28>>> 6/24 OGT >> 6/24 Foley >> PIV   7/1 R PICC>>  ASSESSMENT / PLAN:  NEURO A: Acute metabolic encephalopathy  Delirium  LUE weakness Hx ETOH abuse - ?some degree of ETOH withdrawal - last ETOH use 6/23 P:  RA SS goal 0 to -1 She is been taken off propofol due to hyperlipidemia and placed on Versed and Precedex Putting off opiates as much as possible due to concern for ileus Daily wake-up assessment CT of the head 09/13/2017 no acute process  CARDIOVASCULAR A: Septic Shock  Hypotension  Volume overload >>? Hypovolemia post diuresis 6/30  Intake/Output Summary (Last 24 hours) at 09/14/2017 0741 Last data filed at 09/14/2017 0600 Gross per 24 hour  Intake 3147.49 ml  Output 3635 ml  Net -487.51 ml   P:  Intensive care unit hemodynamic monitoring Weaning Neo-Synephrine as tolerated Negative I&O as tolerated    RESPIRATORY A: Acute hypoxic respiratory failure  ARDS  MSSA PNA  Small pleural effusions  P:  Vent bundle with ARDS protocol Agitation is a block to weaning FiO2 at 40% Wean as tolerated   INFECTIOUS A: Severe Sepsis  PNA -suspected Aspiration given hx vomiting pan sensitive staph aureus on sputum cx 6/25 Fevers>>resolved P:   Trend white count  monitor fever curve Continuing remains on cefepime Monitor culture data, no positive culture data on 09/14/2017   ENDOCRINE CBG (last 3)  Recent Labs    09/13/17 2305 09/14/17 0342 09/14/17 0709  GLUCAP 101* 124* 141*    A: Hypoglycemia > resolved Hypertriglyceridemia in setting of propfol which was DC'd 09/13/2017 P:  Sliding scale insulin protocol Propofol has been discontinued Monitor lipids  GI A: Ischemic colitis - improving  ?infectious colitis Ileus > improved - last BM 6/24 Protein calorie malnutrition P:  Diet as tolerated per tube feeding Flexi-Seal in place   HEMATOLOGIC Recent Labs    09/13/17 0440 09/14/17 0413  HGB 7.5* 7.5*    A: Thrombocytopenia - resolved Anemia -Fecal occult blood negative P:   Transfuse per protocol Monitor for bleeding    RENAL Lab Results  Component Value Date   CREATININE 0.51 09/14/2017   CREATININE 0.49 09/13/2017   CREATININE 0.45 09/12/2017   Recent Labs  Lab 09/12/17 0431 09/13/17 0440 09/14/17 0413  K 3.0* 3.4* 3.5   Recent Labs  Lab 09/12/17 0431 09/13/17 0440 09/14/17 0413  NA 140 135 141    A: Acute Kidney Injury - resolved  Mild hypernatremia - resolved Hypokalemia Non-Anion Gap Metabolic acidosis, likely in setting of diarrhea P:  Strict I&O Daily chemistries Monitor for return of metabolic acidosis Improved electrolytes as needed   Family: 09/14/2017 no family bedside   App cct 30 min   Brett CanalesSteve Minor ACNP [facility] Pager [phone] till 1 pm If no answer page [phone] 09/14/2017, 7:35 AM  Attending Note:  [age] year old female with PNA and ARDS, intubated, not weaning well on exam with decreased BS diffusely.  I reviewed CXR myself, ETT is in good position, infiltrate noted.  Discussed with PCCM-NP and bedside RN.  Will hold off weaning today.  Plan on tracheostomy placement today when begin weaning in AM.  Continue abx.  Keep on the dry side.  Spoke with family, obtained consent for trach for today and all questions were answered bedside.  PCCM will  continue to follow.  The patient is critically ill with multiple organ systems failure and requires high complexity decision making for assessment and support, frequent evaluation and titration of therapies, application of advanced monitoring technologies and extensive interpretation of multiple databases.   Critical Care Time devoted to patient care services described in this note is  32  Minutes. This time reflects time of care of this signee Dr Koren Bound. This critical care time does not reflect procedure time, or teaching time or supervisory time of PA/NP/Med student/Med Resident etc but could involve care discussion time.  Alyson Reedy, M.D. [facility]  Pulmonary/Critical Care Medicine. Pager: [phone]. After hours pager: [phone].

## 2017-09-15 ENCOUNTER — Encounter (HOSPITAL_COMMUNITY): Payer: Self-pay

## 2017-09-15 ENCOUNTER — Inpatient Hospital Stay (HOSPITAL_COMMUNITY): Payer: Medicare Other

## 2017-09-15 LAB — BASIC METABOLIC PANEL
Anion gap: 9 (ref 5–15)
BUN: 8 mg/dL (ref 6–20)
CO2: 27 mmol/L (ref 22–32)
Calcium: 8.1 mg/dL — ABNORMAL LOW (ref 8.9–10.3)
Chloride: 105 mmol/L (ref 98–111)
Creatinine, Ser: 0.52 mg/dL (ref 0.44–1.00)
GFR calc Af Amer: >60 mL/min (ref >60)
GFR calc non Af Amer: >60 mL/min (ref >60)
Glucose, Bld: 147 mg/dL — ABNORMAL HIGH (ref 70–99)
Potassium: 3.8 mmol/L (ref 3.5–5.1)
Sodium: 141 mmol/L (ref 135–145)

## 2017-09-15 LAB — POCT I-STAT 3, ART BLOOD GAS (G3+)
Acid-Base Excess: 6 mmol/L — ABNORMAL HIGH (ref 0.0–2.0)
Bicarbonate: 27.3 mmol/L (ref 20.0–28.0)
O2 Saturation: 94 %
Patient temperature: 99.4
TCO2: 28 mmol/L (ref 22–32)
pCO2 arterial: 28.3 mmHg — ABNORMAL LOW (ref 32.0–48.0)
pH, Arterial: 7.595 — ABNORMAL HIGH (ref 7.350–7.450)
pO2, Arterial: 60 mmHg — ABNORMAL LOW (ref 83.0–108.0)

## 2017-09-15 LAB — GLUCOSE, CAPILLARY
Glucose-Capillary: 103 mg/dL — ABNORMAL HIGH (ref 70–99)
Glucose-Capillary: 130 mg/dL — ABNORMAL HIGH (ref 70–99)
Glucose-Capillary: 140 mg/dL — ABNORMAL HIGH (ref 70–99)
Glucose-Capillary: 147 mg/dL — ABNORMAL HIGH (ref 70–99)
Glucose-Capillary: 150 mg/dL — ABNORMAL HIGH (ref 70–99)
Glucose-Capillary: 151 mg/dL — ABNORMAL HIGH (ref 70–99)
Glucose-Capillary: 159 mg/dL — ABNORMAL HIGH (ref 70–99)

## 2017-09-15 LAB — CBC WITH DIFFERENTIAL/PLATELET
Basophils Absolute: 0.1 10*3/uL (ref 0.0–0.1)
Basophils Relative: 1 %
Eosinophils Absolute: 0.1 10*3/uL (ref 0.0–0.7)
Eosinophils Relative: 1 %
HCT: 25.1 % — ABNORMAL LOW (ref 36.0–46.0)
Hemoglobin: 7.8 g/dL — ABNORMAL LOW (ref 12.0–15.0)
Lymphocytes Relative: 19 %
Lymphs Abs: 1.4 10*3/uL (ref 0.7–4.0)
MCH: 31.2 pg (ref 26.0–34.0)
MCHC: 31.1 g/dL (ref 30.0–36.0)
MCV: 100.4 fL — ABNORMAL HIGH (ref 78.0–100.0)
Monocytes Absolute: 0.9 10*3/uL (ref 0.1–1.0)
Monocytes Relative: 12 %
Neutro Abs: 4.9 10*3/uL (ref 1.7–7.7)
Neutrophils Relative %: 67 %
Platelets: 397 10*3/uL (ref 150–400)
RBC: 2.5 MIL/uL — ABNORMAL LOW (ref 3.87–5.11)
RDW: 15.6 % — ABNORMAL HIGH (ref 11.5–15.5)
WBC: 7.4 10*3/uL (ref 4.0–10.5)

## 2017-09-15 LAB — PROCALCITONIN: Procalcitonin: 0.44 ng/mL

## 2017-09-15 LAB — MAGNESIUM: Magnesium: 1.7 mg/dL (ref 1.7–2.4)

## 2017-09-15 LAB — PHOSPHORUS: Phosphorus: 3.8 mg/dL (ref 2.5–4.6)

## 2017-09-15 MED ORDER — METOPROLOL TARTRATE 25 MG/10 ML ORAL SUSPENSION
25.0000 mg | Freq: Two times a day (BID) | ORAL | Status: DC
  Administered 2017-09-15 – 2017-09-20 (×11): 25 mg
  Filled 2017-09-15 (×11): qty 10

## 2017-09-15 MED ORDER — DULOXETINE HCL 30 MG PO CPEP
30.0000 mg | ORAL_CAPSULE | Freq: Every day | ORAL | Status: DC
  Administered 2017-09-18 – 2017-09-19 (×2): 30 mg via ORAL
  Filled 2017-09-15 (×6): qty 1

## 2017-09-15 MED ORDER — ROSUVASTATIN CALCIUM 20 MG PO TABS
10.0000 mg | ORAL_TABLET | Freq: Every day | ORAL | Status: DC
  Administered 2017-09-15 – 2017-09-19 (×5): 10 mg via ORAL
  Filled 2017-09-15 (×5): qty 1

## 2017-09-15 MED ORDER — DILTIAZEM HCL-DEXTROSE 100-5 MG/100ML-% IV SOLN (PREMIX)
5.0000 mg/h | INTRAVENOUS | Status: AC
  Administered 2017-09-15 – 2017-09-18 (×3): 5 mg/h via INTRAVENOUS
  Administered 2017-09-18: 10 mg/h via INTRAVENOUS
  Filled 2017-09-15 (×8): qty 100

## 2017-09-15 MED ORDER — IPRATROPIUM-ALBUTEROL 0.5-2.5 (3) MG/3ML IN SOLN
3.0000 mL | Freq: Three times a day (TID) | RESPIRATORY_TRACT | Status: DC
  Administered 2017-09-15 (×2): 3 mL via RESPIRATORY_TRACT
  Filled 2017-09-15 (×2): qty 3

## 2017-09-15 MED ORDER — BUPROPION HCL ER (SR) 100 MG PO TB12
100.0000 mg | ORAL_TABLET | Freq: Every morning | ORAL | Status: DC
  Administered 2017-09-16 – 2017-09-19 (×3): 100 mg via ORAL
  Filled 2017-09-15 (×7): qty 1

## 2017-09-15 MED ORDER — QUETIAPINE FUMARATE 100 MG PO TABS
100.0000 mg | ORAL_TABLET | Freq: Two times a day (BID) | ORAL | Status: DC
Start: 2017-09-15 — End: 2017-09-20
  Administered 2017-09-15 – 2017-09-19 (×9): 100 mg via ORAL
  Filled 2017-09-15 (×10): qty 1

## 2017-09-15 MED ORDER — DILTIAZEM LOAD VIA INFUSION
15.0000 mg | Freq: Once | INTRAVENOUS | Status: AC
  Administered 2017-09-15: 15 mg via INTRAVENOUS
  Filled 2017-09-15: qty 15

## 2017-09-15 MED ORDER — MAGNESIUM SULFATE 2 GM/50ML IV SOLN
2.0000 g | Freq: Once | INTRAVENOUS | Status: AC
  Administered 2017-09-15: 2 g via INTRAVENOUS
  Filled 2017-09-15: qty 50

## 2017-09-15 MED ORDER — GABAPENTIN 100 MG PO CAPS
100.0000 mg | ORAL_CAPSULE | Freq: Every day | ORAL | Status: DC
  Administered 2017-09-15 – 2017-09-19 (×5): 100 mg via ORAL
  Filled 2017-09-15 (×5): qty 1

## 2017-09-15 NOTE — Progress Notes (Addendum)
..  Name: Carla Bowen MRN: [id number] DOB: 19-Jul-1955    ADMISSION DATE:  09/03/2017 CONSULTATION DATE:  09/04/2017  REFERRING MD :  Pearson GrippeKIM, JAMES MD  CHIEF COMPLAINT:  Abdominal pain. S/p fall. Hypotension  BRIEF PATIENT DESCRIPTION:  62 yr old female with PMHx Anxiety, Depression, Fibromyalgia, High cholesterol, Hypertension, and CVA p/w abdominal pain, febrile and hypotensive. Received 3.5L IVF and remains hypotensive PCCM consulted for admission. Pt with VDRF in setting of MSSA PNA and ARDS, and septic shock. Tracheostomy (#6 cuffed shiley) placed 09/14/17.  SUBJECTIVE:  Tracheostomy placed 09/14/17 On PRVC: 8 peep, 40% FiO2 Sedated with versed and precedex Currently off Neo-synephrine Having intermittent short runs of SVT(nonsustaining)  VITAL SIGNS: Temp:  [98.2 F (36.8 C)-101.2 F (38.4 C)] 99.7 F (37.6 C) (07/05 0732) Pulse Rate:  [39-112] 81 (07/05 0730) Resp:  [15-42] 18 (07/05 0730) BP: (92-158)/(54-139) 136/69 (07/05 0730) SpO2:  [99 %-100 %] 100 % (07/05 0730) FiO2 (%):  [40 %-100 %] 40 % (07/05 0805) Weight:  [130 lb 11.7 oz (59.3 kg)] 130 lb 11.7 oz (59.3 kg) (07/05 0627)  PHYSICAL EXAMINATION:  General: Acutely ill appearing female, laying in bed, trached, on PRVC, tachypnea  HEENT: Atraumatic, normocephalic, #6 cuffed Shiley Tracheostomy midline neck  Neuro: Currently sedated, reportedly agitated at times CV: Tachycardia, Regular rhythm, s1s2 noted, No M/R/G  PULM: Coarse rhonchi bilaterally, tachypnea, symmetrical expansion  GI: Soft, non-tender, non-distended, BS+ x4, rectal tube in place for liquid stool soft Extremities: Warm/dry, 1+ edema bilateral UE  Skin: Warm/dry, No obvious rashes, lesions, or ulcerations  Recent Labs  Lab 09/13/17 0440 09/14/17 0413 09/15/17 0237  NA 135 141 141  K 3.4* 3.5 3.8  CL 109 111 105  CO2 19* 24 27  BUN 8 9 8   CREATININE 0.49 0.51 0.52  GLUCOSE 133* 150* 147*   Recent Labs  Lab 09/13/17 0440  09/14/17 0413 09/15/17 0237  HGB 7.5* 7.5* 7.8*  HCT 23.0* 24.6* 25.1*  WBC 7.5 5.4 7.4  PLT 356 391 397    SIGNIFICANT EVENTS  6/23 admitted overnight/ hypotensive/ abd pain 6/24 worsening hypoxia -intubated that afternoon 6/26 > off pressors. Remains on vent. Imaging improving.  6/28 self extubated, failed immediately due to hypoxia. Emergently re-intubated.  7/1>> Fevers, increasing vasopressor requirements 7/3>> LUE weakness 7/4>> Tracheostomy, #6 cuffed shiley  STUDIES:  6/23 CT Ab/Pelv: Wall thickening of descending and proximal sigmoid colon is noted consistent with infectious or inflammatory colitis. Bilateral nonobstructive nephrolithiasis. Mild bilateral posterior basilar subsegmental atelectasis. 6/24 CXR >>New left-greater-than-right airspace disease, concerning for multifocal pneumonia or ARDS 6/24  AXR >> 1. Mildly dilated loops of small bowel throughout the abdomen with gas in the colon. Findings are compatible with adynamic ileus. 2. Thickening of the colonic wall compatible with colitis. 3. Bibasilar airspace disease compatible with multi lobar pneumonia versus ARDS 6/25 AXR >> Resolution of small bowel dilatation. Nasogastric catheter within the stomach. 6/24 TTE >>  Left ventricle: The cavity size was normal. Systolic function was   normal. The estimated ejection fraction was in the range of 55%   to 60%. Although no diagnostic regional wall motion abnormality  was identified, this possibility cannot be completely excluded on the basis of this study. Left ventricular diastolic function parameters were normal. 7/1 KUB>> Moderate gaseous distention of the colon. Moderately increased right colonic stool burden without evidence of obstruction of the small or large bowel. 7/3 CT Head>> negative for acute process  Cultures:  6/23 BC x 2 >>  neg 6/23 GI panel PCR >> neg 6/24 MRSA PCR >> neg 6/24 Resp panel >> neg 6/24 UC >> neg 6/24 urine strep >> neg 6/24 urine  legionella >> neg 6/25 trach aspirate >> pan-sensitive staph aureus.  7/1 Trach aspirate>> normal flora 7/1 Blood cultures x2>> 7/1 Urine>> negative  Antibiotics:  6/23 ceftriaxone >> 6/28 6/23 Flagyl >> 6/25 6/23 azithromycin >> 6/25 6/28 cefazolin>>>7/1 7/1 Vancomycin>> 09/13/2017 7/1 Cefepime>>  Lines:  6/24 ETT >>6/28, 6/28>>>7/4 6/24 OGT >> 6/24 Foley >> PIV  7/1 R PICC>> 7/4 Trach #6 cuffed Shiley>>  ASSESSMENT / PLAN:  NEURO A: Acute metabolic encephalopathy  Delirium  LUE weakness Hx ETOH abuse - ?some degree of ETOH withdrawal - last ETOH use 6/23 P:  RASS goal: 0 to -1 Precedex & Versed to maintain RASS goal, wean as tolerated Daily WUA Provide supportive care Avoid Opiates as able due to concern for ileus  Continue Aripiprazole, Clonazepam, Lamictal, Seroquel>>increase Seroquel to 100 mg BID 7/5 Thiamine, folate   CARDIOVASCULAR A: Septic Shock  Hypotension >>improved Volume overload >>? Hypovolemia post diuresis 6/30 Tachycardia P:  ICU monitoring Neo-synephrine prn to maintain MAP >65, wean as tolerated PRN metoprolol IV   Will add Scheduled Metoprolol 20 mg BID per tube (as pt now normotensive off Neo) Follow EKG to assess QTc while on antipsychotics   RESPIRATORY A: Acute hypoxic respiratory failure  ARDS  MSSA PNA  Small pleural effusions  P:  PRVC: ARDS protocol/settings Wean FiO2 & Peep as tolerated SBT as tolerated VAP Bundle Pulmonary hygiene Agitation is obstacle to weaning Follow CXR Abx as above   INFECTIOUS A: Severe Sepsis  PNA -suspected Aspiration given hx vomiting pan sensitive staph aureus on sputum cx 6/25 Fevers>>resolved P:  Monitor fever curve Trend WBC's Trend PCT Follow cultures (Urine and Sputum from 7/1 negative, BC from 7/1 no growth to date) Continue Cefepime   ENDOCRINE A: Hypoglycemia > resolved Hypertriglyceridemia in setting of propfol which was DC'd 09/13/2017 P:  CBG's SSI Monitor  lipids  GI A: Ischemic colitis - improving  ?infectious colitis Ileus > improved - last BM 6/24 Protein calorie malnutrition P:  Tube feedings as tolerated, rate adjustment per Dietician Pepcid for SUP   HEMATOLOGIC A: Thrombocytopenia - resolved Anemia -Fecal occult blood negative P:  Monitor for s/sx of bleeding Follow CBC Transfuse for Hgb<7 Heparin SQ for VTE prophylaxis    RENAL A: Acute Kidney Injury - resolved  Mild hypernatremia - resolved Hypokalemia>>resolved P:  Monitor I&O's / urine output Follow BMP Ensure adequate renal perfusion Avoid nephrotoxic agents as able  Replace electrolytes as indicated   Family: No family present at bedside 09/15/17.   Harlon Ditty, AGACNP-BC [facility] 09/15/2017, 8:13 AM

## 2017-09-15 NOTE — Progress Notes (Signed)
eLink Physician-Brief Progress Note Patient Name: Carla SchlatterBonnie M Lowery DOB: 1955/08/18 MRN: [id number]   Date of Service  09/15/2017  HPI/Events of Note  Hypomag  eICU Interventions  Magnesium replaced     Intervention Category Intermediate Interventions: Electrolyte abnormality - evaluation and management  Manasi Dishon 09/15/2017, 3:13 AM

## 2017-09-15 NOTE — Plan of Care (Signed)
  Problem: Education: Goal: Knowledge of General Education information will improve Outcome: Not Progressing   Problem: Health Behavior/Discharge Planning: Goal: Ability to manage health-related needs will improve Outcome: Not Progressing   Problem: Clinical Measurements: Goal: Ability to maintain clinical measurements within normal limits will improve Outcome: Progressing Goal: Will remain free from infection Outcome: Progressing Goal: Diagnostic test results will improve Outcome: Progressing Goal: Respiratory complications will improve Outcome: Progressing

## 2017-09-16 ENCOUNTER — Inpatient Hospital Stay (HOSPITAL_COMMUNITY): Payer: Medicare Other

## 2017-09-16 LAB — BASIC METABOLIC PANEL
Anion gap: 6 (ref 5–15)
Anion gap: 8 (ref 5–15)
BUN: 9 mg/dL (ref 6–20)
BUN: 9 mg/dL (ref 6–20)
CO2: 29 mmol/L (ref 22–32)
CO2: 28 mmol/L (ref 22–32)
Calcium: 8.1 mg/dL — ABNORMAL LOW (ref 8.9–10.3)
Calcium: 8.3 mg/dL — ABNORMAL LOW (ref 8.9–10.3)
Chloride: 107 mmol/L (ref 98–111)
Chloride: 108 mmol/L (ref 98–111)
Creatinine, Ser: 0.46 mg/dL (ref 0.44–1.00)
Creatinine, Ser: 0.49 mg/dL (ref 0.44–1.00)
GFR calc Af Amer: >60 mL/min (ref >60)
GFR calc Af Amer: >60 mL/min (ref >60)
GFR calc non Af Amer: >60 mL/min (ref >60)
GFR calc non Af Amer: >60 mL/min (ref >60)
Glucose, Bld: 163 mg/dL — ABNORMAL HIGH (ref 70–99)
Glucose, Bld: 129 mg/dL — ABNORMAL HIGH (ref 70–99)
Potassium: 2.9 mmol/L — ABNORMAL LOW (ref 3.5–5.1)
Potassium: 3.7 mmol/L (ref 3.5–5.1)
Sodium: 142 mmol/L (ref 135–145)
Sodium: 144 mmol/L (ref 135–145)

## 2017-09-16 LAB — CULTURE, BLOOD (ROUTINE X 2)
Culture: NO GROWTH
Culture: NO GROWTH

## 2017-09-16 LAB — GLUCOSE, CAPILLARY
Glucose-Capillary: 113 mg/dL — ABNORMAL HIGH (ref 70–99)
Glucose-Capillary: 117 mg/dL — ABNORMAL HIGH (ref 70–99)
Glucose-Capillary: 125 mg/dL — ABNORMAL HIGH (ref 70–99)
Glucose-Capillary: 125 mg/dL — ABNORMAL HIGH (ref 70–99)
Glucose-Capillary: 145 mg/dL — ABNORMAL HIGH (ref 70–99)
Glucose-Capillary: 159 mg/dL — ABNORMAL HIGH (ref 70–99)

## 2017-09-16 LAB — CBC
HCT: 24.6 % — ABNORMAL LOW (ref 36.0–46.0)
Hemoglobin: 7.7 g/dL — ABNORMAL LOW (ref 12.0–15.0)
MCH: 31.3 pg (ref 26.0–34.0)
MCHC: 31.3 g/dL (ref 30.0–36.0)
MCV: 100 fL (ref 78.0–100.0)
Platelets: 411 10*3/uL — ABNORMAL HIGH (ref 150–400)
RBC: 2.46 MIL/uL — ABNORMAL LOW (ref 3.87–5.11)
RDW: 15.4 % (ref 11.5–15.5)
WBC: 9.4 10*3/uL (ref 4.0–10.5)

## 2017-09-16 LAB — MAGNESIUM: Magnesium: 1.8 mg/dL (ref 1.7–2.4)

## 2017-09-16 MED ORDER — WHITE PETROLATUM EX OINT
TOPICAL_OINTMENT | CUTANEOUS | Status: AC
  Administered 2017-09-16: 0.2
  Filled 2017-09-16: qty 28.35

## 2017-09-16 MED ORDER — MAGNESIUM SULFATE 2 GM/50ML IV SOLN: 2.0000 g | Freq: Once | INTRAVENOUS | Status: DC

## 2017-09-16 MED ORDER — POTASSIUM CHLORIDE 20 MEQ/15ML (10%) PO SOLN
40.0000 meq | ORAL | Status: AC
  Administered 2017-09-16 (×2): 40 meq
  Filled 2017-09-16 (×2): qty 30

## 2017-09-16 MED ORDER — POTASSIUM CHLORIDE 20 MEQ/15ML (10%) PO SOLN: 40.0000 meq | ORAL | Status: DC

## 2017-09-16 MED ORDER — MAGNESIUM SULFATE 2 GM/50ML IV SOLN
2.0000 g | Freq: Once | INTRAVENOUS | Status: AC
  Administered 2017-09-16: 2 g via INTRAVENOUS
  Filled 2017-09-16: qty 50

## 2017-09-16 MED ORDER — IBUPROFEN 100 MG/5ML PO SUSP
400.0000 mg | Freq: Four times a day (QID) | ORAL | Status: DC | PRN
  Administered 2017-09-17 (×2): 400 mg via ORAL
  Filled 2017-09-16 (×3): qty 20

## 2017-09-16 NOTE — Plan of Care (Signed)
  Problem: Elimination: Goal: Will not experience complications related to urinary retention Outcome: Progressing   Problem: Safety: Goal: Ability to remain free from injury will improve Outcome: Not Progressing   Problem: Skin Integrity: Goal: Risk for impaired skin integrity will decrease Outcome: Not Progressing

## 2017-09-16 NOTE — Progress Notes (Signed)
..  Name: Carla Bowen MRN: [id number] DOB: March 15, 1955    ADMISSION DATE:  09/03/2017 CONSULTATION DATE:  09/04/2017  REFERRING MD :  Pearson Grippe MD  CHIEF COMPLAINT:  Abdominal pain. S/p fall. Hypotension  BRIEF PATIENT DESCRIPTION:  62 yr old female with PMHx Anxiety, Depression, Fibromyalgia, High cholesterol, Hypertension, and CVA p/w abdominal pain, febrile and hypotensive. Received 3.5L IVF and remains hypotensive PCCM consulted for admission. Pt with VDRF in setting of MSSA PNA and ARDS, and septic shock. Tracheostomy (#6 cuffed shiley) placed 09/14/17.  SUBJECTIVE:  Currently being heavily sedated requiring Neo-Synephrine, have asked RN to stop all sedation sibling to evaluate wake up assessment note and questionable liberation from mechanical ventilatory support.  VITAL SIGNS: Temp:  [98.1 F (36.7 C)-99.7 F (37.6 C)] 98.1 F (36.7 C) (07/06 0330) Pulse Rate:  [35-155] 61 (07/06 0700) Resp:  [10-32] 16 (07/06 0700) BP: (82-146)/(47-87) 99/56 (07/06 0700) SpO2:  [96 %-100 %] 99 % (07/06 0700) FiO2 (%):  [40 %] 40 % (07/06 0400) Weight:  [123 lb 0.3 oz (55.8 kg)] 123 lb 0.3 oz (55.8 kg) (07/06 0220)  PHYSICAL EXAMINATION:  General: Obese female heavily sedated HEENT: Calcium in place, feeding tube in place, no JVD lymphadenopathy is appreciated Neuro: Currently heavily sedated poorly responsive CV: Has a regular regular rate and rhythm with sinus rate of 61 PULM: even/non-labored, lungs bilaterally coarse rhonchi MV:HQIO, non-tender, bsx4 active  Extremities: warm/dry, 1+ edema  Skin: no rashes or lesions   Recent Labs  Lab 09/14/17 0413 09/15/17 0237 09/16/17 0312  NA 141 141 142  K 3.5 3.8 2.9*  CL 111 105 107  CO2 24 27 29   BUN 9 8 9   CREATININE 0.51 0.52 0.46  GLUCOSE 150* 147* 163*   Recent Labs  Lab 09/14/17 0413 09/15/17 0237 09/16/17 0312  HGB 7.5* 7.8* 7.7*  HCT 24.6* 25.1* 24.6*  WBC 5.4 7.4 9.4  PLT 391 397 411*    SIGNIFICANT EVENTS   6/23 admitted overnight/ hypotensive/ abd pain 6/24 worsening hypoxia -intubated that afternoon 6/26 > off pressors. Remains on vent. Imaging improving.  6/28 self extubated, failed immediately due to hypoxia. Emergently re-intubated.  7/1>> Fevers, increasing vasopressor requirements 7/3>> LUE weakness 7/4>> Tracheostomy, #6 cuffed shiley  STUDIES:  6/23 CT Ab/Pelv: Wall thickening of descending and proximal sigmoid colon is noted consistent with infectious or inflammatory colitis. Bilateral nonobstructive nephrolithiasis. Mild bilateral posterior basilar subsegmental atelectasis. 6/24 CXR >>New left-greater-than-right airspace disease, concerning for multifocal pneumonia or ARDS 6/24  AXR >> 1. Mildly dilated loops of small bowel throughout the abdomen with gas in the colon. Findings are compatible with adynamic ileus. 2. Thickening of the colonic wall compatible with colitis. 3. Bibasilar airspace disease compatible with multi lobar pneumonia versus ARDS 6/25 AXR >> Resolution of small bowel dilatation. Nasogastric catheter within the stomach. 6/24 TTE >>  Left ventricle: The cavity size was normal. Systolic function was   normal. The estimated ejection fraction was in the range of 55%   to 60%. Although no diagnostic regional wall motion abnormality  was identified, this possibility cannot be completely excluded on the basis of this study. Left ventricular diastolic function parameters were normal. 7/1 KUB>> Moderate gaseous distention of the colon. Moderately increased right colonic stool burden without evidence of obstruction of the small or large bowel. 7/3 CT Head>> negative for acute process  Cultures:  6/23 BC x 2 >> neg 6/23 GI panel PCR >> neg 6/24 MRSA PCR >> neg 6/24 Resp  panel >> neg 6/24 UC >> neg 6/24 urine strep >> neg 6/24 urine legionella >> neg 6/25 trach aspirate >> pan-sensitive staph aureus.  7/1 Trach aspirate>> normal flora 7/1 Blood cultures x2>> 09/16/2017 no  growth to date 7/1 Urine>> negative  Antibiotics:  6/23 ceftriaxone >> 6/28 6/23 Flagyl >> 6/25 6/23 azithromycin >> 6/25 6/28 cefazolin>>>7/1 7/1 Vancomycin>> 09/13/2017 7/1 Cefepime>>  Lines:  6/24 ETT >>6/28, 6/28>>>7/4 6/24 OGT >> 6/24 Foley >> PIV  7/1 R PICC>> 7/4 Trach #6 cuffed Shiley>>  ASSESSMENT / PLAN:  NEURO A: Acute metabolic encephalopathy  Delirium  LUE weakness Hx ETOH abuse - ?some degree of ETOH withdrawal - last ETOH use 6/23 P:  RA SS goal 0 Wean sedation as tolerated as she has a tracheostomy Treatment for suspected EtOH withdrawal Recent CT of head revealed no acute intracranial abnormality   CARDIOVASCULAR A: Septic Shock  Hypotension >>improved Volume overload >>? Hypovolemia post diuresis 6/30 Tachycardia P:  ICU monitoring Follow QTc interval Note she had periods of bradycardia early a.m. 09/16/2017 she is now off Cardizem drip, pericardial may be related to extensive sedation protocol.   RESPIRATORY A: Acute hypoxic respiratory failure  ARDS  MSSA PNA  Small pleural effusions  P:  ARDS protocol Wean as tolerated now she has a tracheostomy in place Stop sedation 09/16/2017 for eval and spontaneous breathing trial   INFECTIOUS A: Severe Sepsis  PNA -suspected Aspiration given hx vomiting pan sensitive staph aureus on sputum cx 6/25 Fevers>>resolved P:  Monitor fever curve Blood culture data 09/16/2017 no growth culture today    ENDOCRINE CBG (last 3)  Recent Labs    09/15/17 1916 09/15/17 2328 09/16/17 0316  GLUCAP 151* 103* 159*    A: Hypoglycemia > resolved Hypertriglyceridemia in setting of propfol which was DC'd 09/13/2017 P:  Sliding scale insulin protocol  GI A: Ischemic colitis - improving  ?infectious colitis Ileus > improved - last BM 6/24 09/16/2017 has Flexi-Seal in place with drainage Protein calorie malnutrition P:  Plan tube feedings at goal Continue with Pepcid Consider discontinuing  Flexi-Seal   HEMATOLOGIC Recent Labs    09/15/17 0237 09/16/17 0312  HGB 7.8* 7.7*    A: Thrombocytopenia - resolved Anemia -Fecal occult blood negative P:  Transfuse per protocol Monitor for any evidence of hemorrhage Heparin for DVT prophylaxis     RENAL Lab Results  Component Value Date   CREATININE 0.46 09/16/2017   CREATININE 0.52 09/15/2017   CREATININE 0.51 09/14/2017    Recent Labs  Lab 09/14/17 0413 09/15/17 0237 09/16/17 0312  K 3.5 3.8 2.9*   Recent Labs  Lab 09/14/17 0413 09/15/17 0237 09/16/17 0312  NA 141 141 142    A: Acute Kidney Injury - resolved  Mild hypernatremia - resolved Hypokalemia>> P:  Replete potassium Follow chemistries Avoid nephrotoxic   Family: 09/16/2017 no family at bedside   App cct 30 min  Brett CanalesSteve Erik Burkett ACNP [facility] Pager [phone] till 1 pm If no answer page [phone] 09/16/2017, 7:31 AM

## 2017-09-16 NOTE — Progress Notes (Signed)
[facility] notified of K+=2.9, mag=1.8, and Ca=8.1. Orders for replacement acknowledged and given. [facility] also notified of cardizem gtt being put on pause since 0350 d/t HR of 58-63. No new orders for that at this time. Will continue to monitor and restart drip if needed.

## 2017-09-16 NOTE — Progress Notes (Signed)
[facility] FOR [facility] LAB REPLACEMENT ONLY  The patient does apply for [facility]ly[facility] Protocol based on the criteria listed below:   1. Is GFR >/= 40 ml/min? Yes.    Patient's GFR today is >60 2. Is urine output >/= 0.5 ml/kg/hr for the last 6 hours? Yes.   Patient's UOP is 1.3 ml/kg/hr 3. Is BUN < 60 mg/dL? Yes.    Patient's BUN today is 9 4. Abnormal electrolyte(s): K-2.9, Mag-1.8 5. Ordered repletion with: per protocol 6. If a panic level lab has been reported, has the CCM MD in charge been notified? Yes.  .   Physician:  Dr. Roxy Mannseterding  Carla Bowen, Carla BoosMaria Bowen 09/16/2017 4:37 AM

## 2017-09-17 ENCOUNTER — Inpatient Hospital Stay (HOSPITAL_COMMUNITY): Payer: Medicare Other

## 2017-09-17 LAB — BASIC METABOLIC PANEL
Anion gap: 7 (ref 5–15)
BUN: 8 mg/dL (ref 6–20)
CO2: 27 mmol/L (ref 22–32)
Calcium: 8.4 mg/dL — ABNORMAL LOW (ref 8.9–10.3)
Chloride: 108 mmol/L (ref 98–111)
Creatinine, Ser: 0.51 mg/dL (ref 0.44–1.00)
GFR calc Af Amer: >60 mL/min (ref >60)
GFR calc non Af Amer: >60 mL/min (ref >60)
Glucose, Bld: 139 mg/dL — ABNORMAL HIGH (ref 70–99)
Potassium: 3.9 mmol/L (ref 3.5–5.1)
Sodium: 142 mmol/L (ref 135–145)

## 2017-09-17 LAB — GLUCOSE, CAPILLARY
Glucose-Capillary: 140 mg/dL — ABNORMAL HIGH (ref 70–99)
Glucose-Capillary: 97 mg/dL (ref 70–99)
Glucose-Capillary: 119 mg/dL — ABNORMAL HIGH (ref 70–99)
Glucose-Capillary: 113 mg/dL — ABNORMAL HIGH (ref 70–99)
Glucose-Capillary: 132 mg/dL — ABNORMAL HIGH (ref 70–99)

## 2017-09-17 LAB — CALCIUM, IONIZED: Calcium, Ionized, Serum: 4.8 mg/dL (ref 4.5–5.6)

## 2017-09-17 LAB — MAGNESIUM: Magnesium: 2 mg/dL (ref 1.7–2.4)

## 2017-09-17 LAB — PHOSPHORUS: Phosphorus: 3.1 mg/dL (ref 2.5–4.6)

## 2017-09-17 MED ORDER — MIDAZOLAM HCL 2 MG/2ML IJ SOLN
1.0000 mg | INTRAMUSCULAR | Status: DC | PRN
  Administered 2017-09-18 – 2017-09-19 (×3): 2 mg via INTRAVENOUS
  Administered 2017-09-19: 1 mg via INTRAVENOUS
  Administered 2017-09-20 – 2017-09-23 (×13): 2 mg via INTRAVENOUS
  Filled 2017-09-17 (×17): qty 2

## 2017-09-17 NOTE — Progress Notes (Signed)
PCCM Progress Note  Brief description: 62 yo female presented 6/23 with abdominal pain, diarrhea, hypotension, altered mental status, fever 103F with sepsis from colitis.  Seen by surgery and GI and managed conservatively with ABx.  Course complicated by MSSA pneumonia, acute hypoxic respiratory failure, ileus from opiates, a fib with RVR.  Failed to wean from vent and required tracheostomy.  Past medical history: Anxiety with depression, HTN, HLD, CVA, fibromyalgia.  Subjective: Has remained off IV sedation.  Vital signs: BP 137/63   Pulse (!) 101   Temp 98.5 F (36.9 C) (Oral)   Resp (!) 26   Ht 5\' 1"  (1.549 m)   Wt 119 lb 4.3 oz (54.1 kg)   LMP  (LMP Unknown)   SpO2 97%   BMI 22.54 kg/m   Intake/output: I/O last 3 completed shifts: In: 4713.2 [P.O.:10; I.V.:735; NG/GT:1815; IV Piggyback:2153.3] Out: 5500 [Urine:5075; Stool:425]  Physical exam:  General - somnolent Eyes - pupils reactive ENT - trach site clean Cardiac - regular, no murmur Chest - no wheeze, rales Abd - soft, non tender Ext - no edema Skin - no rashes Neuro - follows commands  Labs: CBC Recent Labs    09/15/17 0237 09/16/17 0312  WBC 7.4 9.4  HGB 7.8* 7.7*  HCT 25.1* 24.6*  PLT 397 411*    Coag's No results for input(s): APTT, INR in the last 72 hours.  BMET Recent Labs    09/16/17 0312 09/16/17 1926 09/17/17 0017  NA 142 144 142  K 2.9* 3.7 3.9  CL 107 108 108  CO2 29 28 27   BUN 9 9 8   CREATININE 0.46 0.49 0.51  GLUCOSE 163* 129* 139*    Electrolytes Recent Labs    09/15/17 0237 09/16/17 0312 09/16/17 1926 09/17/17 0017  CALCIUM 8.1* 8.1* 8.3* 8.4*  MG 1.7 1.8  --  2.0  PHOS 3.8  --   --  3.1    Sepsis Markers Recent Labs    09/15/17 0237  PROCALCITON 0.44    ABG Recent Labs    09/15/17 0410  PHART 7.595*  PCO2ART 28.3*  PO2ART 60.0*    Liver Enzymes No results for input(s): AST, ALT, ALKPHOS, BILITOT, ALBUMIN in the last 72 hours.  Cardiac  Enzymes No results for input(s): TROPONINI, PROBNP in the last 72 hours.  Glucose Recent Labs    09/16/17 1549 09/16/17 1945 09/16/17 2352 09/17/17 0412 09/17/17 0729 09/17/17 1120  GLUCAP 117* 113* 125* 140* 97 119*    Imaging Dg Chest Port 1 View  Result Date: 09/17/2017 CLINICAL DATA:  Respiratory failure EXAM: PORTABLE CHEST 1 VIEW COMPARISON:  09/16/2017 chest radiograph. FINDINGS: Tracheostomy tube tip overlies the tracheal air column at the thoracic inlet. Right PICC terminates over the right atrium. Enteric tube enters stomach with the tip not seen on this image. Stable cardiomediastinal silhouette with normal heart size. No pneumothorax. Stable small left pleural effusion. No right pleural effusion. Low lung volumes. No pulmonary edema. Stable left retrocardiac opacity and mild right lung base atelectasis. IMPRESSION: 1. Support structures as detailed. 2. Stable small left pleural effusion. 3. Stable left retrocardiac opacity, favor atelectasis. 4. Stable mild right lung base atelectasis. Electronically Signed   By: Delbert PhenixJason A Poff M.D.   On: 09/17/2017 07:02   Dg Chest Port 1 View  Result Date: 09/16/2017 CLINICAL DATA:  Acute respiratory failure EXAM: PORTABLE CHEST 1 VIEW COMPARISON:  September 15, 2017 FINDINGS: The tracheostomy tube and right PICC line are stable. The OG tube terminates below  today's film. No pneumothorax. Left basilar opacity with a possible small associated effusion are stable. Stable cardiomediastinal silhouette spur. No other changes. IMPRESSION: 1. Stable support apparatus as above. 2. Stable left basilar opacity with a probable small associated effusion. Electronically Signed   By: Gerome Sam III M.D   On: 09/16/2017 07:02    Assessment/plan:  Acute respiratory failure with hypoxia. Failure to wean s/p trach. - pressure support to trach collar as able  Sepsis from MSSA PNA, colitis. Recurrent fever 7/01. - day 7 of cefepime > d/c after dose on  7/07  Acute metabolic encephalopathy. Hx of CVA, depression, anxiety, fibromyaglia - CT head 7/03 unremarkable - abilify, klonopin, neurontin, lamictal, seroquel  Hx of HTN, HLD. Intermittent A fib. - lopressor, crestor  Updated pt's family at bedside  Discussed option of LTAC placement if she continues to improve.  Coralyn Helling, MD [facility] Pulmonary/Critical Care 09/17/2017, 12:02 PM

## 2017-09-17 NOTE — Progress Notes (Signed)
..  Name: Carla SchlatterBonnie M Bowen MRN: [id number] DOB: 1956/02/24    ADMISSION DATE:  09/03/2017 CONSULTATION DATE:  09/04/2017  REFERRING MD :  Pearson GrippeKIM, JAMES MD  CHIEF COMPLAINT:  Abdominal pain. S/p fall. Hypotension  BRIEF PATIENT DESCRIPTION:  62 yr old female with PMHx Anxiety, Depression, Fibromyalgia, High cholesterol, Hypertension, and CVA p/w abdominal pain, febrile and hypotensive. Received 3.5L IVF and remains hypotensive PCCM consulted for admission. Pt with VDRF in setting of MSSA PNA and ARDS, and septic shock. Tracheostomy (#6 cuffed shiley) placed 09/14/17.  SUBJECTIVE:  Spiked fever during the night.  Blood culture x2.  Off all sedation x16 hours much more alert tolerating pressure support ventilation.  VITAL SIGNS: Temp:  [98.3 F (36.8 C)-102.1 F (38.9 C)] 98.3 F (36.8 C) (07/07 0738) Pulse Rate:  [69-120] 103 (07/07 0700) Resp:  [16-35] 30 (07/07 0700) BP: (90-166)/(48-95) 143/82 (07/07 0700) SpO2:  [95 %-100 %] 97 % (07/07 0700) FiO2 (%):  [40 %] 40 % (07/07 0404) Weight:  [119 lb 4.3 oz (54.1 kg)] 119 lb 4.3 oz (54.1 kg) (07/07 0141)  PHYSICAL EXAMINATION:  General: Obese female currently on full ventilatory support via tracheostomy HEENT: Pupils equal reactive light, #6 Shiley trach with sutures remaining Neuro: More awake follows simple commands CV: Heart sounds regular rate rate and rhythm PULM: even/non-labored, lungs bilaterally diminished JY:NWGNGI:soft, non-tender, bsx4 active  Extremities: warm/dry, 1+ edema  Skin: no rashes or lesions    Recent Labs  Lab 09/16/17 0312 09/16/17 1926 09/17/17 0017  NA 142 144 142  K 2.9* 3.7 3.9  CL 107 108 108  CO2 29 28 27   BUN 9 9 8   CREATININE 0.46 0.49 0.51  GLUCOSE 163* 129* 139*   Recent Labs  Lab 09/14/17 0413 09/15/17 0237 09/16/17 0312  HGB 7.5* 7.8* 7.7*  HCT 24.6* 25.1* 24.6*  WBC 5.4 7.4 9.4  PLT 391 397 411*    SIGNIFICANT EVENTS  6/23 admitted overnight/ hypotensive/ abd pain 6/24 worsening  hypoxia -intubated that afternoon 6/26 > off pressors. Remains on vent. Imaging improving.  6/28 self extubated, failed immediately due to hypoxia. Emergently re-intubated.  7/1>> Fevers, increasing vasopressor requirements 7/3>> LUE weakness 7/4>> Tracheostomy, #6 cuffed shiley 09/16/2017>> spiked fever, recultured no additional antimicrobial therapy ordered at this time  STUDIES:  6/23 CT Ab/Pelv: Wall thickening of descending and proximal sigmoid colon is noted consistent with infectious or inflammatory colitis. Bilateral nonobstructive nephrolithiasis. Mild bilateral posterior basilar subsegmental atelectasis. 6/24 CXR >>New left-greater-than-right airspace disease, concerning for multifocal pneumonia or ARDS 6/24  AXR >> 1. Mildly dilated loops of small bowel throughout the abdomen with gas in the colon. Findings are compatible with adynamic ileus. 2. Thickening of the colonic wall compatible with colitis. 3. Bibasilar airspace disease compatible with multi lobar pneumonia versus ARDS 6/25 AXR >> Resolution of small bowel dilatation. Nasogastric catheter within the stomach. 6/24 TTE >>  Left ventricle: The cavity size was normal. Systolic function was   normal. The estimated ejection fraction was in the range of 55%   to 60%. Although no diagnostic regional wall motion abnormality  was identified, this possibility cannot be completely excluded on the basis of this study. Left ventricular diastolic function parameters were normal. 7/1 KUB>> Moderate gaseous distention of the colon. Moderately increased right colonic stool burden without evidence of obstruction of the small or large bowel. 7/3 CT Head>> negative for acute process  Cultures:  6/23 BC x 2 >> neg 6/23 GI panel PCR >> neg 6/24 MRSA  PCR >> neg 6/24 Resp panel >> neg 6/24 UC >> neg 6/24 urine strep >> neg 6/24 urine legionella >> neg 6/25 trach aspirate >> pan-sensitive staph aureus.  7/1 Trach aspirate>> normal flora 7/1 Blood  cultures x2>> negative 7/1 Urine>> negative 09/16/2017 blood culture>>  Antibiotics:  6/23 ceftriaxone >> 6/28 6/23 Flagyl >> 6/25 6/23 azithromycin >> 6/25 6/28 cefazolin>>>7/1 7/1 Vancomycin>> 09/13/2017 7/1 Cefepime>>  Lines:  6/24 ETT >>6/28, 6/28>>>7/4 6/24 OGT >> 6/24 Foley >> PIV  7/1 R PICC>> 7/4 Trach #6 cuffed Shiley>>  ASSESSMENT / PLAN:  NEURO A: Acute metabolic encephalopathy  Delirium  LUE weakness Hx ETOH abuse - ?some degree of ETOH withdrawal - last ETOH use 6/23 P:  RA SS goal 0 She is been off sedation approximately 16 hours 09/17/2017 much more awake   CARDIOVASCULAR A: Septic Shock  Hypotension >>improved Volume overload >>? Hypovolemia post diuresis 6/30 Tachycardia P:  ICU monitoring Hemodynamically stable   RESPIRATORY A: Acute hypoxic respiratory failure  ARDS  MSSA PNA  Small pleural effusions  P:  Tolerated 4 hours of pressure support on 09/16/2017 Wean as tolerated Euvolemia or negative I&O as tolerated   INFECTIOUS A: Severe Sepsis  PNA -suspected Aspiration given hx vomiting pan sensitive staph aureus on sputum cx 6/25 Fevers>> spiked fever 102.1 on 09/16/2017 P:  Monitor fever curve Additional blood culture ordered on 09/16/2017 Currently on cefepime If she spikes fever will consider restarting vancomycin    ENDOCRINE CBG (last 3)  Recent Labs    09/16/17 2352 09/17/17 0412 09/17/17 0729  GLUCAP 125* 140* 97    A: Hypoglycemia > resolved Hypertriglyceridemia in setting of propfol which was DC'd 09/13/2017 P:  Sliding scale insulin per protocol  GI A: Ischemic colitis - improving  ?infectious colitis Ileus > improved - last BM 6/24, was noted to have Flexi-Seal with continuous drainage Protein calorie malnutrition P:  Flexi-Seal was removed 09/16/2017 Intermittent loose stools Continue tube feeds GI protection   HEMATOLOGIC Recent Labs    09/15/17 0237 09/16/17 0312  HGB 7.8* 7.7*     A: Thrombocytopenia - resolved Anemia -Fecal occult blood negative P:   Monitor CBC  transfuse per protocol    RENAL Lab Results  Component Value Date   CREATININE 0.51 09/17/2017   CREATININE 0.49 09/16/2017   CREATININE 0.46 09/16/2017    Recent Labs  Lab 09/16/17 0312 09/16/17 1926 09/17/17 0017  K 2.9* 3.7 3.9   Recent Labs  Lab 09/16/17 0312 09/16/17 1926 09/17/17 0017  NA 142 144 142    Intake/Output Summary (Last 24 hours) at 09/17/2017 0757 Last data filed at 09/17/2017 0700 Gross per 24 hour  Intake 3345.93 ml  Output 4200 ml  Net -854.07 ml   A: Acute Kidney Injury - resolved  Mild hypernatremia - resolved Hypokalemia>> P:  Replete electrolytes as needed Follow chemistry Avoid nephrotoxins   Family: 09/17/2017 no family at bedside   App cct  Brett Canales Taye Cato ACNP [facility] PCCM Pager [phone] till 1 pm If no answer page [phone] 09/17/2017, 7:52 AM

## 2017-09-17 NOTE — Progress Notes (Signed)
eLink Physician-Brief Progress Note Patient Name: Vonzell SchlatterBonnie M Pleitez DOB: 1955-03-22 MRN: [id number]   Date of Service  09/17/2017  HPI/Events of Note  Fever to 102.1 F - Patient is currently on Cefepime. AST elevated modestly at 47. Therefore, will avoid Tylenol. Creatinine = 0.49.   eICU Interventions  Will order: 1. Blood cultures X 1.  2. Motrin Suspension 400 mg per tube Q 6 hours PRN fever > 101.0 F.      Intervention Category Major Interventions: Infection - evaluation and management  Cambri Plourde Eugene 09/17/2017, 12:00 AM

## 2017-09-17 NOTE — Progress Notes (Signed)
Placed patient back on PRVC due to increased RR.

## 2017-09-18 ENCOUNTER — Inpatient Hospital Stay (HOSPITAL_COMMUNITY): Payer: Medicare Other

## 2017-09-18 LAB — GLUCOSE, CAPILLARY
Glucose-Capillary: 107 mg/dL — ABNORMAL HIGH (ref 70–99)
Glucose-Capillary: 121 mg/dL — ABNORMAL HIGH (ref 70–99)
Glucose-Capillary: 126 mg/dL — ABNORMAL HIGH (ref 70–99)
Glucose-Capillary: 130 mg/dL — ABNORMAL HIGH (ref 70–99)
Glucose-Capillary: 137 mg/dL — ABNORMAL HIGH (ref 70–99)
Glucose-Capillary: 139 mg/dL — ABNORMAL HIGH (ref 70–99)
Glucose-Capillary: 140 mg/dL — ABNORMAL HIGH (ref 70–99)

## 2017-09-18 LAB — BASIC METABOLIC PANEL
Anion gap: 8 (ref 5–15)
BUN: 10 mg/dL (ref 6–20)
CO2: 29 mmol/L (ref 22–32)
Calcium: 8.5 mg/dL — ABNORMAL LOW (ref 8.9–10.3)
Chloride: 108 mmol/L (ref 98–111)
Creatinine, Ser: 0.42 mg/dL — ABNORMAL LOW (ref 0.44–1.00)
GFR calc Af Amer: >60 mL/min (ref >60)
GFR calc non Af Amer: >60 mL/min (ref >60)
Glucose, Bld: 145 mg/dL — ABNORMAL HIGH (ref 70–99)
Potassium: 3.2 mmol/L — ABNORMAL LOW (ref 3.5–5.1)
Sodium: 145 mmol/L (ref 135–145)

## 2017-09-18 LAB — PHOSPHORUS: Phosphorus: 4 mg/dL (ref 2.5–4.6)

## 2017-09-18 LAB — MAGNESIUM: Magnesium: 1.8 mg/dL (ref 1.7–2.4)

## 2017-09-18 MED ORDER — NICOTINE 14 MG/24HR TD PT24
14.0000 mg | MEDICATED_PATCH | Freq: Every day | TRANSDERMAL | Status: DC
  Administered 2017-09-18 – 2017-09-25 (×8): 14 mg via TRANSDERMAL
  Filled 2017-09-18 (×8): qty 1

## 2017-09-18 MED ORDER — POTASSIUM CHLORIDE 20 MEQ/15ML (10%) PO SOLN
30.0000 meq | ORAL | Status: AC
  Administered 2017-09-18 (×2): 30 meq
  Filled 2017-09-18 (×2): qty 30

## 2017-09-18 MED ORDER — MELATONIN 3 MG PO TABS
3.0000 mg | ORAL_TABLET | Freq: Every day | ORAL | Status: DC
  Administered 2017-09-18 – 2017-09-24 (×7): 3 mg
  Filled 2017-09-18 (×9): qty 1

## 2017-09-18 MED ORDER — DILTIAZEM 12 MG/ML ORAL SUSPENSION
60.0000 mg | Freq: Four times a day (QID) | ORAL | Status: DC
  Administered 2017-09-18 – 2017-09-25 (×27): 60 mg
  Filled 2017-09-18 (×32): qty 6

## 2017-09-18 MED ORDER — NON FORMULARY: 3.0000 mg | Freq: Every day | Status: DC

## 2017-09-18 MED ORDER — MAGNESIUM SULFATE 2 GM/50ML IV SOLN
2.0000 g | Freq: Once | INTRAVENOUS | Status: AC
  Administered 2017-09-18: 2 g via INTRAVENOUS
  Filled 2017-09-18: qty 50

## 2017-09-18 NOTE — Progress Notes (Signed)
Placed patient on PRVC due to respirotry distress RR 50 and low Spo2 of 88%.

## 2017-09-18 NOTE — Progress Notes (Signed)
..  Name: Carla Bowen MRN: [id number] DOB: 12-Feb-1956    ADMISSION DATE:  09/03/2017 CONSULTATION DATE:  09/04/2017  REFERRING MD :  Pearson Grippe MD  CHIEF COMPLAINT:  Abdominal pain. S/p fall. Hypotension  BRIEF PATIENT DESCRIPTION:  62 yr old female with PMHx Anxiety, Depression, Fibromyalgia, High cholesterol, Hypertension, and CVA p/w abdominal pain, febrile and hypotensive. Received 3.5L IVF and remains hypotensive PCCM consulted for admission. Pt with VDRF in setting of MSSA PNA and ARDS, and septic shock. Tracheostomy (#6 cuffed shiley) placed 09/14/17.  SUBJECTIVE:  On PSV: 40%, 8/5 currently Low grade fever 100.7 this am Drowsy, off sedation, purposeful movements all extremities Cardizem gtt 10 mg/hr, ST on telemetry Currently sleeping, resting comfortably, no distress   VITAL SIGNS: Temp:  [98.5 F (36.9 C)-100.7 F (38.2 C)] 100.7 F (38.2 C) (07/08 0720) Pulse Rate:  [35-120] 104 (07/08 0800) Resp:  [16-34] 27 (07/08 0800) BP: (129-169)/(50-137) 148/74 (07/08 0800) SpO2:  [92 %-100 %] 97 % (07/08 0800) FiO2 (%):  [40 %] 40 % (07/08 0800) Weight:  [119 lb 4.3 oz (54.1 kg)] 119 lb 4.3 oz (54.1 kg) (07/08 0131)  PHYSICAL EXAMINATION:  General: Acutely ill appearing female, laying in bed, on PSV via tracheostomy, in no acute distress  HEENT: Atraumatic, normocephalic, #6 Shiley trach midline neck with sutures in place, Pupils PERRL  Neuro: Drowsy, arouses to voice and light touch, purposeful movements all extremities  CV: Tachycardia, regular rhythm, s1s2 noted, No M/R/G, 2+ pulses all extremities PULM: Rhonchi bilaterally, even, non-labored, PSV  YQ:MVHQ, non-tender, BS+ x4  Extremities: Warm/dry, 1+ edema  Skin: Warm. No obvious rashes, lesions, or ulcerations     Recent Labs  Lab 09/16/17 1926 09/17/17 0017 09/18/17 0328  NA 144 142 145  K 3.7 3.9 3.2*  CL 108 108 108  CO2 28 27 29   BUN 9 8 10   CREATININE 0.49 0.51 0.42*  GLUCOSE 129* 139* 145*    Recent Labs  Lab 09/14/17 0413 09/15/17 0237 09/16/17 0312  HGB 7.5* 7.8* 7.7*  HCT 24.6* 25.1* 24.6*  WBC 5.4 7.4 9.4  PLT 391 397 411*    SIGNIFICANT EVENTS  6/23 admitted overnight/ hypotensive/ abd pain 6/24 worsening hypoxia -intubated that afternoon 6/26 > off pressors. Remains on vent. Imaging improving.  6/28 self extubated, failed immediately due to hypoxia. Emergently re-intubated.  7/1>> Fevers, increasing vasopressor requirements 7/3>> LUE weakness 7/4>> Tracheostomy, #6 cuffed shiley 09/16/2017>> spiked fever, recultured no additional antimicrobial therapy ordered at this time  STUDIES:  6/23 CT Ab/Pelv: Wall thickening of descending and proximal sigmoid colon is noted consistent with infectious or inflammatory colitis. Bilateral nonobstructive nephrolithiasis. Mild bilateral posterior basilar subsegmental atelectasis. 6/24 CXR >>New left-greater-than-right airspace disease, concerning for multifocal pneumonia or ARDS 6/24  AXR >> 1. Mildly dilated loops of small bowel throughout the abdomen with gas in the colon. Findings are compatible with adynamic ileus. 2. Thickening of the colonic wall compatible with colitis. 3. Bibasilar airspace disease compatible with multi lobar pneumonia versus ARDS 6/25 AXR >> Resolution of small bowel dilatation. Nasogastric catheter within the stomach. 6/24 TTE >>  Left ventricle: The cavity size was normal. Systolic function was   normal. The estimated ejection fraction was in the range of 55%   to 60%. Although no diagnostic regional wall motion abnormality  was identified, this possibility cannot be completely excluded on the basis of this study. Left ventricular diastolic function parameters were normal. 7/1 KUB>> Moderate gaseous distention of the colon. Moderately increased  right colonic stool burden without evidence of obstruction of the small or large bowel. 7/3 CT Head>> negative for acute process  Cultures:  6/23 BC x 2 >>  neg 6/23 GI panel PCR >> neg 6/24 MRSA PCR >> neg 6/24 Resp panel >> neg 6/24 UC >> neg 6/24 urine strep >> neg 6/24 urine legionella >> neg 6/25 trach aspirate >> pan-sensitive staph aureus.  7/1 Trach aspirate>> normal flora 7/1 Blood cultures x2>> negative 7/1 Urine>> negative 09/16/2017 blood culture>>  Antibiotics:  6/23 ceftriaxone >> 6/28 6/23 Flagyl >> 6/25 6/23 azithromycin >> 6/25 6/28 cefazolin>>>7/1 7/1 Vancomycin>> 09/13/2017 7/1 Cefepime>>7/7  Lines:  6/24 ETT >>6/28, 6/28>>>7/4 6/24 OGT >> 6/24 Foley >> PIV  7/1 R PICC>> 7/4 Trach #6 cuffed Shiley>>  ASSESSMENT / PLAN:  NEURO A: Acute metabolic encephalopathy  Delirium  LUE weakness Hx ETOH abuse - ?some degree of ETOH withdrawal - last ETOH use 6/23 P:  RASS goal 0 Remains Off sedation  Continue Abilify, Wellbutrin, Clonazepam, Cymbalta, Lamictal, Seroquel Provide supportive care Lights on during day Consider consulting PT   CARDIOVASCULAR A: Septic Shock  Hypotension >>improved Volume overload >>? Hypovolemia post diuresis 6/30 A-fib w/ RVR>>currently ST P:  ICU monitoring Maintain MAP>65 Continue Cardizem gtt, consider switching to PO cardizem   RESPIRATORY A: Acute hypoxic respiratory failure  ARDS  MSSA PNA  Small pleural effusions  P:  Daily SBT as tolerated, wean to Trach collar trials as tolerated VAP Bundle Pulmonary hygiene Follow CXR Maintain Euvolemia Completed course of Vancomycin & Cefepime   INFECTIOUS A: Severe Sepsis  PNA -suspected Aspiration given hx vomiting pan sensitive staph aureus on sputum cx 6/25 Fevers>> spiked fever 102.1 on 09/16/2017 P:  Monitor fever curve Trend WBC's Follow blood cultures from 09/16/17 Completed course Cefepime 09/17/17 If spikes fever again, consider restarting Abx   ENDOCRINE A: Hypoglycemia > resolved Hypertriglyceridemia in setting of propfol which was DC'd 09/13/2017 P:  CBG's SSI Follow ICU hypo/hyperglycemia  protocol  GI A: Ischemic colitis - improving  ?infectious colitis Ileus > improved - last BM 6/24, was noted to have Flexi-Seal with continuous drainage Protein calorie malnutrition P:  NPO Continue tube feeds Pepcid for SUP Intermittent loose stools   HEMATOLOGIC A: Thrombocytopenia - resolved Anemia -Fecal occult blood negative P:  Monitor for s/sx of bleeding Follow CBC Heparin SQ for VTE prophylaxis Transfuse for Hgb <7 Transfuse for Plt <50 and bleeding    RENAL A: Acute Kidney Injury - resolved  Mild hypernatremia - resolved Hypokalemia>> P:  Monitor I&O's / urine output Follow BMP Ensure adequate renal perfusion  Avoid nephrotoxic agents as able Replace electrolytes as indicated  K (30 mEq x2 doses) repleted 7/8, follow BMP in am 7/9   Family: 09/18/2017 No family at bedside.   Harlon DittyJeremiah Shauntay Brunelli, AGACNP-BC [facility] 09/18/2017, 8:25 AM

## 2017-09-18 NOTE — Progress Notes (Signed)
Nutrition Follow-up  DOCUMENTATION CODES:   Not applicable  INTERVENTION:    Continue Vital AF 1.2 at 45 ml/h to provide 1296 kcal, 81 gm protein, 876 ml free water daily  NUTRITION DIAGNOSIS:   Inadequate oral intake related to inability to eat as evidenced by NPO status.  Ongoing  GOAL:   Patient will meet greater than or equal to 90% of their needs  Met with TF  MONITOR:   Vent status, Labs, I & O's, Weight trends  ASSESSMENT:   61-year-old female who presented to ED with abdominal pain, diarrhea, and weakness. PMH significant for EtOH abuse, anxiety, depression, hypertension, hyperlipidemia, CVA, and fibromyalgia.  Discussed patient in ICU rounds and with RN today. Patient is tolerating TF at goal rate via NG tube to meet 100% of estimated nutrition needs. S/P tracheostomy 7/4.  Tolerated trach collar for a little while this morning. Now back on vent support. MV: 9.9 L/min Temp (24hrs), Avg:99.4 F (37.4 C), Min:98.2 F (36.8 C), Max:100.7 F (38.2 C)  Labs reviewed. Potassium 3.2 (L) CBG's: 130-126-139 Medications reviewed and include folic acid, thiamine, and novolog.   Diet Order:   Diet Order           Diet NPO time specified  Diet effective midnight          EDUCATION NEEDS:   No education needs have been identified at this time  Skin:  Skin Assessment: Reviewed RN Assessment  Last BM:  7/7 (type 6)  Height:   Ht Readings from Last 1 Encounters:  09/04/17 5' 1" (1.549 m)    Weight:   Wt Readings from Last 1 Encounters:  09/18/17 119 lb 4.3 oz (54.1 kg)    Ideal Body Weight:  47.7 kg  BMI:  Body mass index is 22.54 kg/m.  Estimated Nutritional Needs:   Kcal:  1275 kcal/day  Protein:  75-90 grams/day  Fluid:  1.7 L/day     , RD, LDN, CNSC Pager [phone] After Hours Pager [phone]  

## 2017-09-18 NOTE — Progress Notes (Signed)
EEG complete. Results pending.  ?

## 2017-09-18 NOTE — Care Management (Addendum)
09/20/2017 Pt now tolerating TC some - attending would like to continue to hold LTACH referral at this time  09/18/17 Updated:  [facility] and current medical instability.  CM will continue to follow  CM left voicemail for physician advisor to discuss potential LTACH referral.

## 2017-09-19 LAB — GLUCOSE, CAPILLARY
Glucose-Capillary: 119 mg/dL — ABNORMAL HIGH (ref 70–99)
Glucose-Capillary: 131 mg/dL — ABNORMAL HIGH (ref 70–99)
Glucose-Capillary: 157 mg/dL — ABNORMAL HIGH (ref 70–99)
Glucose-Capillary: 132 mg/dL — ABNORMAL HIGH (ref 70–99)
Glucose-Capillary: 100 mg/dL — ABNORMAL HIGH (ref 70–99)
Glucose-Capillary: 129 mg/dL — ABNORMAL HIGH (ref 70–99)

## 2017-09-19 LAB — CBC
HCT: 26.9 % — ABNORMAL LOW (ref 36.0–46.0)
Hemoglobin: 8.2 g/dL — ABNORMAL LOW (ref 12.0–15.0)
MCH: 31.1 pg (ref 26.0–34.0)
MCHC: 30.5 g/dL (ref 30.0–36.0)
MCV: 101.9 fL — ABNORMAL HIGH (ref 78.0–100.0)
Platelets: 485 10*3/uL — ABNORMAL HIGH (ref 150–400)
RBC: 2.64 MIL/uL — ABNORMAL LOW (ref 3.87–5.11)
RDW: 15.3 % (ref 11.5–15.5)
WBC: 12.7 10*3/uL — ABNORMAL HIGH (ref 4.0–10.5)

## 2017-09-19 LAB — BASIC METABOLIC PANEL
Anion gap: 9 (ref 5–15)
BUN: 12 mg/dL (ref 6–20)
CO2: 28 mmol/L (ref 22–32)
Calcium: 8.7 mg/dL — ABNORMAL LOW (ref 8.9–10.3)
Chloride: 105 mmol/L (ref 98–111)
Creatinine, Ser: 0.48 mg/dL (ref 0.44–1.00)
GFR calc Af Amer: >60 mL/min (ref >60)
GFR calc non Af Amer: >60 mL/min (ref >60)
Glucose, Bld: 131 mg/dL — ABNORMAL HIGH (ref 70–99)
Potassium: 3.7 mmol/L (ref 3.5–5.1)
Sodium: 142 mmol/L (ref 135–145)

## 2017-09-19 MED ORDER — LAMOTRIGINE 25 MG PO TABS
25.0000 mg | ORAL_TABLET | Freq: Two times a day (BID) | ORAL | Status: DC
  Administered 2017-09-19 – 2017-09-25 (×12): 25 mg
  Filled 2017-09-19 (×13): qty 1

## 2017-09-19 MED ORDER — CLONAZEPAM 1 MG PO TABS
1.0000 mg | ORAL_TABLET | Freq: Two times a day (BID) | ORAL | Status: AC
  Administered 2017-09-19 – 2017-09-20 (×3): 1 mg
  Filled 2017-09-19 (×3): qty 1

## 2017-09-19 MED ORDER — CLONAZEPAM 0.5 MG PO TABS
0.5000 mg | ORAL_TABLET | Freq: Two times a day (BID) | ORAL | Status: DC
  Administered 2017-09-21 – 2017-09-22 (×3): 0.5 mg
  Filled 2017-09-19 (×3): qty 1

## 2017-09-19 MED ORDER — CLONAZEPAM 0.1 MG/ML ORAL SUSPENSION
1.0000 mg | Freq: Two times a day (BID) | ORAL | Status: DC
  Filled 2017-09-19: qty 10

## 2017-09-19 MED ORDER — CLONAZEPAM 0.1 MG/ML ORAL SUSPENSION: 0.5000 mg | Freq: Two times a day (BID) | ORAL | Status: DC

## 2017-09-19 NOTE — Progress Notes (Signed)
..  Name: Carla Bowen MRN: [id number] DOB: Apr 06, 1955    ADMISSION DATE:  09/03/2017 CONSULTATION DATE:  09/04/2017  REFERRING MD :  Pearson Grippe MD  CHIEF COMPLAINT:  Abdominal pain. S/p fall. Hypotension  BRIEF PATIENT DESCRIPTION:  62 yr old female with PMHx Anxiety, Depression, Fibromyalgia, High cholesterol, Hypertension, and CVA p/w abdominal pain, febrile and hypotensive. Received 3.5L IVF and remains hypotensive PCCM consulted for admission. Pt with VDRF in setting of MSSA PNA and ARDS, and septic shock. Tracheostomy (#6 cuffed shiley) placed 09/14/17.  SUBJECTIVE:  Awake, fairly calm, follows commands, in no acute distress On Trach collar 35%, currently tolerateing Afebrile   VITAL SIGNS: Temp:  [98.2 F (36.8 C)-99.2 F (37.3 C)] 99.1 F (37.3 C) (07/09 0800) Pulse Rate:  [80-111] 103 (07/09 0900) Resp:  [12-38] 30 (07/09 0900) BP: (87-156)/(48-85) 149/69 (07/09 0900) SpO2:  [91 %-100 %] 93 % (07/09 0900) FiO2 (%):  [35 %-40 %] 35 % (07/09 0800) Weight:  [116 lb 6.5 oz (52.8 kg)] 116 lb 6.5 oz (52.8 kg) (07/09 0344)  PHYSICAL EXAMINATION:  General: Acutely ill appearing female, laying in bed, on 35% trach collar, in no acute distress  HEENT: Atraumatic, normocephalic, #6 Shiley trach midline neck, Pupils PERRL   Neuro: Awake, calm, nods to questions, follows commands, moves all extremities, no focal deficits noted CV: Tachycardia, regular rhythm, s1s2 noted, No M/R/G, 2+ pulses all extremities  PULM: Rhonchi bilaterally, even, non-labored, on 35% trach collar GI: Soft, non-tender, BS active x4  Extremities: Warm/dry. No deformities, No edema Skin: Warm/dry.  No obvious rashes, lesions, or ulcerations     Recent Labs  Lab 09/17/17 0017 09/18/17 0328 09/19/17 0346  NA 142 145 142  K 3.9 3.2* 3.7  CL 108 108 105  CO2 27 29 28   BUN 8 10 12   CREATININE 0.51 0.42* 0.48  GLUCOSE 139* 145* 131*   Recent Labs  Lab 09/15/17 0237 09/16/17 0312  09/19/17 0346  HGB 7.8* 7.7* 8.2*  HCT 25.1* 24.6* 26.9*  WBC 7.4 9.4 12.7*  PLT 397 411* 485*    SIGNIFICANT EVENTS  6/23 admitted overnight/ hypotensive/ abd pain 6/24 worsening hypoxia -intubated that afternoon 6/26 > off pressors. Remains on vent. Imaging improving.  6/28 self extubated, failed immediately due to hypoxia. Emergently re-intubated.  7/1>> Fevers, increasing vasopressor requirements 7/3>> LUE weakness 7/4>> Tracheostomy, #6 cuffed shiley 09/16/2017>> spiked fever, recultured no additional antimicrobial therapy ordered at this time  STUDIES:  6/23 CT Ab/Pelv: Wall thickening of descending and proximal sigmoid colon is noted consistent with infectious or inflammatory colitis. Bilateral nonobstructive nephrolithiasis. Mild bilateral posterior basilar subsegmental atelectasis. 6/24 CXR >>New left-greater-than-right airspace disease, concerning for multifocal pneumonia or ARDS 6/24  AXR >> 1. Mildly dilated loops of small bowel throughout the abdomen with gas in the colon. Findings are compatible with adynamic ileus. 2. Thickening of the colonic wall compatible with colitis. 3. Bibasilar airspace disease compatible with multi lobar pneumonia versus ARDS 6/25 AXR >> Resolution of small bowel dilatation. Nasogastric catheter within the stomach. 6/24 TTE >>  Left ventricle: The cavity size was normal. Systolic function was   normal. The estimated ejection fraction was in the range of 55%   to 60%. Although no diagnostic regional wall motion abnormality  was identified, this possibility cannot be completely excluded on the basis of this study. Left ventricular diastolic function parameters were normal. 7/1 KUB>> Moderate gaseous distention of the colon. Moderately increased right colonic stool burden without evidence of obstruction  of the small or large bowel. 7/3 CT Head>> negative for acute process 7/8 EEG>> generalized non-specific cerebral dysfunction(encephalopathy). There was  no seizure or seizure predisposition recorded on this study. Please note that a normal EEG does not preclude the possibility of epilepsy.    Cultures:  6/23 BC x 2 >> neg 6/23 GI panel PCR >> neg 6/24 MRSA PCR >> neg 6/24 Resp panel >> neg 6/24 UC >> neg 6/24 urine strep >> neg 6/24 urine legionella >> neg 6/25 trach aspirate >> pan-sensitive staph aureus.  7/1 Trach aspirate>> normal flora 7/1 Blood cultures x2>> negative 7/1 Urine>> negative 09/16/2017 blood culture>>  Antibiotics:  6/23 ceftriaxone >> 6/28 6/23 Flagyl >> 6/25 6/23 azithromycin >> 6/25 6/28 cefazolin>>>7/1 7/1 Vancomycin>> 09/13/2017 7/1 Cefepime>>7/7  Lines:  6/24 ETT >>6/28, 6/28>>>7/4 6/24 OGT >> 6/24 Foley >> PIV  7/1 R PICC>> 7/4 Trach #6 cuffed Shiley>>  ASSESSMENT / PLAN:  NEURO A: Acute metabolic encephalopathy  -CT head 7/3 negative -EEG 7/8 with generalized nonspecific cerebral dysfunction (encephalopathy), but no seizure activity Delirium  LUE weakness Hx ETOH abuse - ?some degree of ETOH withdrawal - last ETOH use 6/23 P:  RASS goal 0 Continue Abilify, Wellbutrin, Clonazepam, Cymbalta, Lamictal, Seroquel Provide supportive Lights on during the day Promote wake-sleep cycle Melatonin qHS  Consult PT, OOB as able    CARDIOVASCULAR A: Septic Shock >>resolved Hypotension >>resolved A-fib w/ RVR>>currently ST with intermittent A-fib P:  ICU monitoring Cardizem per Tube   RESPIRATORY A: Acute hypoxic respiratory failure -s/p Tracheostomy 09/14/17  ARDS  MSSA PNA  Small pleural effusions  P:  Daily Trach collar trials as tolerated, goal for 3 hour trials twice daily with rest in between trial Rest on vent at night VAP Bundle Pulmonary hygiene Follow intermittent CXR as needed Maintain Euvovolemia Completed course of Vancomycin & Cefepime   INFECTIOUS A: Severe Sepsis >>resolved PNA -suspected Aspiration given hx vomiting pan sensitive staph aureus on sputum cx  6/25 -treated  Fevers>> spiked fever 102.1 on 09/16/2017 P:  Monitor fever curve Trend WBC's Follow blood cultures from 09/16/17 Completed course vancomycin & Cefepime   ENDOCRINE A: Hyperglycemia P:  CBG's SSI Follow ICU hypo/hyperglycemia protocol  GI A: Ischemic colitis - resolved ?infectious colitis> resolved Ileus > improved - last BM 6/24, was noted to have Flexi-Seal with continuous drainage Protein calorie malnutrition P:  NPO Continue tube feeds, rate adjustment per Dietician Pepcid for SUP   HEMATOLOGIC A: Anemia -Fecal occult blood negative P:  Monitor for s/sx of bleeding Follow CBC Heparin SQ for VTE prophylaxis Transfuse for Hgb<7    RENAL A: No active issues P:  Monitor I&O's / urine output Follow BMP Ensure adequate renal perfusion Avoid nephrotoxic agents as able Replace electrolytes as indicated   Family: No family at bedside 09/19/17.  Called and updated pt's son Oretha Capriceyler Roselle via telephone 09/19/17.   Harlon DittyJeremiah Alivya Wegman, AGACNP-BC [facility] 09/19/2017, 9:23 AM

## 2017-09-19 NOTE — Progress Notes (Signed)
Patient placed on 35% trach collar.  Currently tolerating well.  Will continue to monitor.  

## 2017-09-19 NOTE — Progress Notes (Signed)
Patient placed back on ventilator, on full support, due to a decrease in sats to the 80s.  Patient sats currently 100% and is tolerating well.  Will continue to monitor.

## 2017-09-19 NOTE — Procedures (Signed)
History: 62 yo F with agitation and delirium  Sedation: none  Technique: This is a 21 channel routine scalp EEG performed at the bedside with bipolar and monopolar montages arranged in accordance to the international 10/20 system of electrode placement. One channel was dedicated to EKG recording.    Background: There is a well formed posterior dominant rhythm of 9 Hz. There is also diffuse irregular delta and theta activites superimposed on the background. Occasionally, there are bifrontally predominant discharges with triphasic morphology.   Photic stimulation: Physiologic driving is not performed  EEG Abnormalities: 1) Triphasic waves 2) Generalized irregular slow activity  Clinical Interpretation: This EEG is consistent with a generalized non-specific cerebral dysfunction(encephalopathy). There was no seizure or seizure predisposition recorded on this study. Please note that a normal EEG does not preclude the possibility of epilepsy.   Ritta SlotMcNeill Ceria Suminski, MD [facility] [phone]  If 7pm- 7am, please page neurology on call as listed in AMION.

## 2017-09-19 NOTE — Progress Notes (Signed)
Per MD note attempt to wean patient every 3 hours and then allow 3 hours of rest.  Due to sats decreasing while on trach collar this AM placed patient on PS/CPAP of 10/5 at 1610.  Currently tolerating well.  Will continue to monitor.

## 2017-09-19 NOTE — Progress Notes (Signed)
Speech-Language Pathology Note:  SLP following chart as part of trach team. Note that pt has started TC trials. Please consider ordering PMV/swallowing evaluations as medically appropriate.   Thanks,  Maxcine HamLaura Paiewonsky, M.A. CCC-SLP [phone]

## 2017-09-20 LAB — GLUCOSE, CAPILLARY
Glucose-Capillary: 109 mg/dL — ABNORMAL HIGH (ref 70–99)
Glucose-Capillary: 129 mg/dL — ABNORMAL HIGH (ref 70–99)
Glucose-Capillary: 130 mg/dL — ABNORMAL HIGH (ref 70–99)
Glucose-Capillary: 133 mg/dL — ABNORMAL HIGH (ref 70–99)
Glucose-Capillary: 136 mg/dL — ABNORMAL HIGH (ref 70–99)
Glucose-Capillary: 123 mg/dL — ABNORMAL HIGH (ref 70–99)

## 2017-09-20 LAB — COMPREHENSIVE METABOLIC PANEL
ALT: 42 U/L (ref 0–44)
AST: 30 U/L (ref 15–41)
Albumin: 2.2 g/dL — ABNORMAL LOW (ref 3.5–5.0)
Alkaline Phosphatase: 79 U/L (ref 38–126)
Anion gap: 11 (ref 5–15)
BUN: 19 mg/dL (ref 6–20)
CO2: 29 mmol/L (ref 22–32)
Calcium: 8.8 mg/dL — ABNORMAL LOW (ref 8.9–10.3)
Chloride: 102 mmol/L (ref 98–111)
Creatinine, Ser: 0.52 mg/dL (ref 0.44–1.00)
GFR calc Af Amer: >60 mL/min (ref >60)
GFR calc non Af Amer: >60 mL/min (ref >60)
Glucose, Bld: 110 mg/dL — ABNORMAL HIGH (ref 70–99)
Potassium: 3.9 mmol/L (ref 3.5–5.1)
Sodium: 142 mmol/L (ref 135–145)
Total Bilirubin: 0.4 mg/dL (ref 0.3–1.2)
Total Protein: 6.2 g/dL — ABNORMAL LOW (ref 6.5–8.1)

## 2017-09-20 LAB — CBC
HCT: 27.2 % — ABNORMAL LOW (ref 36.0–46.0)
Hemoglobin: 8.2 g/dL — ABNORMAL LOW (ref 12.0–15.0)
MCH: 30.6 pg (ref 26.0–34.0)
MCHC: 30.1 g/dL (ref 30.0–36.0)
MCV: 101.5 fL — ABNORMAL HIGH (ref 78.0–100.0)
Platelets: 453 10*3/uL — ABNORMAL HIGH (ref 150–400)
RBC: 2.68 MIL/uL — ABNORMAL LOW (ref 3.87–5.11)
RDW: 15.2 % (ref 11.5–15.5)
WBC: 13.6 10*3/uL — ABNORMAL HIGH (ref 4.0–10.5)

## 2017-09-20 LAB — PROTIME-INR
INR: 1.02
Prothrombin Time: 13.3 seconds (ref 11.4–15.2)

## 2017-09-20 LAB — AMMONIA: Ammonia: 20 umol/L (ref 9–35)

## 2017-09-20 MED ORDER — BUPROPION HCL 100 MG PO TABS
50.0000 mg | ORAL_TABLET | Freq: Two times a day (BID) | ORAL | Status: DC
  Administered 2017-09-20 – 2017-09-25 (×11): 50 mg
  Filled 2017-09-20: qty 1
  Filled 2017-09-20 (×4): qty 0.5
  Filled 2017-09-20: qty 1
  Filled 2017-09-20: qty 0.5
  Filled 2017-09-20: qty 1
  Filled 2017-09-20: qty 0.5
  Filled 2017-09-20: qty 1
  Filled 2017-09-20 (×2): qty 0.5

## 2017-09-20 MED ORDER — DEXMEDETOMIDINE HCL IN NACL 400 MCG/100ML IV SOLN
0.4000 ug/kg/h | INTRAVENOUS | Status: DC
  Administered 2017-09-20: 1 ug/kg/h via INTRAVENOUS
  Filled 2017-09-20: qty 100

## 2017-09-20 MED ORDER — HYDRALAZINE HCL 20 MG/ML IJ SOLN: 5.0000 mg | Freq: Four times a day (QID) | INTRAMUSCULAR | Status: DC | PRN

## 2017-09-20 MED ORDER — GABAPENTIN 250 MG/5ML PO SOLN
100.0000 mg | Freq: Every day | ORAL | Status: DC
Start: 2017-09-20 — End: 2017-09-25
  Administered 2017-09-20 – 2017-09-24 (×5): 100 mg
  Filled 2017-09-20 (×6): qty 2

## 2017-09-20 MED ORDER — QUETIAPINE FUMARATE 25 MG PO TABS
100.0000 mg | ORAL_TABLET | Freq: Two times a day (BID) | ORAL | Status: DC
  Administered 2017-09-20 – 2017-09-25 (×11): 100 mg
  Filled 2017-09-20: qty 4
  Filled 2017-09-20: qty 1
  Filled 2017-09-20: qty 4
  Filled 2017-09-20 (×6): qty 1
  Filled 2017-09-20 (×2): qty 4
  Filled 2017-09-20: qty 1

## 2017-09-20 MED ORDER — METOPROLOL TARTRATE 25 MG/10 ML ORAL SUSPENSION
25.0000 mg | Freq: Two times a day (BID) | ORAL | Status: DC
  Administered 2017-09-20 – 2017-09-25 (×10): 25 mg
  Filled 2017-09-20 (×11): qty 10

## 2017-09-20 MED ORDER — ROSUVASTATIN CALCIUM 10 MG PO TABS
10.0000 mg | ORAL_TABLET | Freq: Every day | ORAL | Status: DC
Start: 2017-09-20 — End: 2017-09-25
  Administered 2017-09-20 – 2017-09-24 (×5): 10 mg
  Filled 2017-09-20 (×5): qty 1

## 2017-09-20 MED ORDER — AMLODIPINE BESYLATE 10 MG PO TABS
10.0000 mg | ORAL_TABLET | Freq: Every day | ORAL | Status: DC
  Administered 2017-09-20 – 2017-09-25 (×6): 10 mg
  Filled 2017-09-20 (×5): qty 1

## 2017-09-20 MED ORDER — METOPROLOL TARTRATE 25 MG/10 ML ORAL SUSPENSION: 50.0000 mg | Freq: Two times a day (BID) | ORAL | Status: DC

## 2017-09-20 NOTE — Progress Notes (Signed)
Cortrak Tube Team Note:  Consult received to place a Cortrak feeding tube.   A 10 F Cortrak tube was placed in the RIGHT nare and secured with a nasal bridle at 91 cm. Per the Cortrak monitor reading the tube tip is post-pyloric (D3).   Post-pyloric placement not required, pt previously being fed via gastric NG tube. However, pt moves around in bed and slides down. Pt lying flat on visit today despite HOB>30 degrees. Post-placement, pt again slid down and was lying flat in bed putting her at increased aspiration risk with gastric feedings  No x-ray is required. RN may begin using tube.   If the tube becomes dislodged please keep the tube and contact the Cortrak team at [email or web address] (password [email or web address]) for replacement.  If after hours and replacement cannot be delayed, place a NG tube and confirm placement with an abdominal x-ray.   Romelle Starcherate Renell Coaxum MS, RD, LDN, CNSC [phone] Pager  [phone] Weekend/On-Call Pager

## 2017-09-20 NOTE — Progress Notes (Signed)
RT came in and patient's sats were in 80s.  RT suctioned patient, sats came up but patient still having increased WOB.  RT placed patient back on full support.  RT will monitor.

## 2017-09-20 NOTE — Progress Notes (Signed)
eLink Physician-Brief Progress Note Patient Name: Carla SchlatterBonnie M Ferrall DOB: 06/10/1955 MRN: [id number]   Date of Service  09/20/2017  HPI/Events of Note  Extremely agitated, trying to crawl over her bed rails. Was agitated last night as well. + delirium.  eICU Interventions  Precedex infusion overnight        Migdalia DkOkoronkwo U Annice Jolly 09/20/2017, 9:08 PM

## 2017-09-20 NOTE — Progress Notes (Signed)
..  Name: Carla Bowen MRN: [id number] DOB: 13-Jul-1955    ADMISSION DATE:  09/03/2017 CONSULTATION DATE:  09/04/2017  REFERRING MD :  Pearson Grippe MD  CHIEF COMPLAINT:  Abdominal pain. S/p fall. Hypotension  BRIEF PATIENT DESCRIPTION:  62 yr old female with PMHx Anxiety, Depression, Fibromyalgia, High cholesterol, Hypertension, and CVA p/w abdominal pain, febrile and hypotensive. Received 3.5L IVF and remains hypotensive PCCM consulted for admission. Pt with VDRF in setting of MSSA PNA and ARDS, and septic shock. Tracheostomy (#6 cuffed shiley) placed 09/14/17.  SUBJECTIVE:  On PRVC: 40%, 5 peep Arouses to voice, follows simple commands No acute events overnight per nursing Hypertensive (168/106)   VITAL SIGNS: Temp:  [99 F (37.2 C)-99.8 F (37.7 C)] 99.6 F (37.6 C) (07/10 0728) Pulse Rate:  [82-111] 106 (07/10 0600) Resp:  [9-33] 16 (07/10 0600) BP: (99-169)/(55-99) 147/99 (07/10 0600) SpO2:  [89 %-100 %] 97 % (07/10 0600) FiO2 (%):  [35 %-40 %] 40 % (07/10 0322) Weight:  [110 lb 10.7 oz (50.2 kg)] 110 lb 10.7 oz (50.2 kg) (07/10 0405)  PHYSICAL EXAMINATION:  General: Acutely ill appearing female, laying in bed, trached, on PRVC, in no acute distress   HEENT: Atraumatic, normocephalic, #6 Shiley trach midline neck, pupils PERRL  Neuro: Asleep, arouses to voice, follows simple commands, moves all extremities, no focal deficits  CV: RRR, s1s2 noted, No M/R/G, 2+ pulses all extremities   PULM: Coarse breath sounds bilaterally, even, non-labored, on PRVC  GI: Soft, non-tender, BS active x4    Extremities: Warm/dry. No deformities. No edema Skin: Warm/dry.  No obvious rashes, lesions, or ulcerations     Recent Labs  Lab 09/18/17 0328 09/19/17 0346 09/20/17 0413  NA 145 142 142  K 3.2* 3.7 3.9  CL 108 105 102  CO2 29 28 29   BUN 10 12 19   CREATININE 0.42* 0.48 0.52  GLUCOSE 145* 131* 110*   Recent Labs  Lab 09/16/17 0312 09/19/17 0346 09/20/17 0413  HGB 7.7*  8.2* 8.2*  HCT 24.6* 26.9* 27.2*  WBC 9.4 12.7* 13.6*  PLT 411* 485* 453*    SIGNIFICANT EVENTS  6/23 admitted overnight/ hypotensive/ abd pain 6/24 worsening hypoxia -intubated that afternoon 6/26 > off pressors. Remains on vent. Imaging improving.  6/28 self extubated, failed immediately due to hypoxia. Emergently re-intubated.  7/1>> Fevers, increasing vasopressor requirements 7/3>> LUE weakness 7/4>> Tracheostomy, #6 cuffed shiley 09/16/2017>> spiked fever, recultured no additional antimicrobial therapy ordered at this time  STUDIES:  6/23 CT Ab/Pelv: Wall thickening of descending and proximal sigmoid colon is noted consistent with infectious or inflammatory colitis. Bilateral nonobstructive nephrolithiasis. Mild bilateral posterior basilar subsegmental atelectasis. 6/24 CXR >>New left-greater-than-right airspace disease, concerning for multifocal pneumonia or ARDS 6/24  AXR >> 1. Mildly dilated loops of small bowel throughout the abdomen with gas in the colon. Findings are compatible with adynamic ileus. 2. Thickening of the colonic wall compatible with colitis. 3. Bibasilar airspace disease compatible with multi lobar pneumonia versus ARDS 6/25 AXR >> Resolution of small bowel dilatation. Nasogastric catheter within the stomach. 6/24 TTE >>  Left ventricle: The cavity size was normal. Systolic function was   normal. The estimated ejection fraction was in the range of 55%   to 60%. Although no diagnostic regional wall motion abnormality  was identified, this possibility cannot be completely excluded on the basis of this study. Left ventricular diastolic function parameters were normal. 7/1 KUB>> Moderate gaseous distention of the colon. Moderately increased right colonic stool burden  without evidence of obstruction of the small or large bowel. 7/3 CT Head>> negative for acute process 7/8 EEG>> generalized non-specific cerebral dysfunction(encephalopathy). There was no seizure or seizure  predisposition recorded on this study. Please note that a normal EEG does not preclude the possibility of epilepsy.    Cultures:  6/23 BC x 2 >> neg 6/23 GI panel PCR >> neg 6/24 MRSA PCR >> neg 6/24 Resp panel >> neg 6/24 UC >> neg 6/24 urine strep >> neg 6/24 urine legionella >> neg 6/25 trach aspirate >> pan-sensitive staph aureus.  7/1 Trach aspirate>> normal flora 7/1 Blood cultures x2>> negative 7/1 Urine>> negative 09/16/2017 blood culture>>  Antibiotics:  6/23 ceftriaxone >> 6/28 6/23 Flagyl >> 6/25 6/23 azithromycin >> 6/25 6/28 cefazolin>>>7/1 7/1 Vancomycin>> 09/13/2017 7/1 Cefepime>>7/7  Lines:  6/24 ETT >>6/28, 6/28>>>7/4 6/24 OGT >> 6/24 Foley >> PIV  7/1 R PICC>> 7/4 Trach #6 cuffed Shiley>>  ASSESSMENT / PLAN:  NEURO A: Acute metabolic encephalopathy  -CT head 7/3 negative -EEG 7/8 with generalized nonspecific cerebral dysfunction (encephalopathy), but no seizure activity Delirium  LUE weakness Hx ETOH abuse - ?some degree of ETOH withdrawal - last ETOH use 6/23 P:  RASS goal 0 Continue Abilify, Wellbutrin, Cymbalta, Lamictal, Seroquel Tapering down Clonazepam Provide supportive care Lights on during the day Promote wake-sleep cycle Melatonin qHS PT consulted, mobilize as able   CARDIOVASCULAR A: Septic Shock >>resolved Hypotension >>resolved A-fib w/ RVR>>currently ST with intermittent A-fib HTN P: ICU monitoring Cardizem & Metoprolol per tube    RESPIRATORY A: Acute hypoxic respiratory failure -s/p Tracheostomy 09/14/17  ARDS  MSSA PNA  Small pleural effusions  P:  Daily Trach collar trials as tolerated Rest on vent at night VAP bundle Pulmonary hygiene Follow intermittent CXR prn Maintain Euvovelmia Completed course of Vancomycin & Cefepime   INFECTIOUS A: Severe Sepsis >>resolved PNA -suspected Aspiration given hx vomiting pan sensitive staph aureus on sputum cx 6/25 -treated  Fevers>> spiked fever 102.1 on  09/16/2017 P:  Monitor fever curve Trend WBC's Follow blood cultures from 09/16/17 Completed course vancomycin & Cefepime   ENDOCRINE A: Hyperglycemia P:  CBG's SSI Follow ICU hypo/hyperglycemia protocol  GI A: Ischemic colitis - resolved ?infectious colitis> resolved Ileus > improved - last BM 6/24>>resolved Protein calorie malnutrition P:  NPO Continue tube feeds, rate adjustment per Dietician Pepcid for SUP   HEMATOLOGIC A: Anemia -Fecal occult blood negative P:  Monitor for s/sx of bleeding Follow CBC Heparin SQ for VTE prophylaxis Transfuse for Hgb<7    RENAL A: No active issues P:  Monitor I&O's / urine output Follow BMP Ensure adequate renal perfusion Avoid nephrotoxic agents as able Replace electrolytes as indicated   Family: No family at bedside 09/20/17 on nursing rounds.   Harlon DittyJeremiah Kalif Kattner, AGACNP-BC [facility] 09/20/2017, 8:20 AM

## 2017-09-20 NOTE — Evaluation (Signed)
Physical Therapy Evaluation Patient Details Name: Carla SchlatterBonnie M Sanzo MRN: [id number] DOB: 12/05/1955 Today's Date: 09/20/2017   History of Present Illness  Pt is a 62 y.o. female admitted 09/03/17 with AMS, abdominal pain and diarrhea; worked up for sepsis secondary to colitis vs. pneumonia. Pt with VDRF in setting of MSSA PNA, ARDS, and septic shock. ETT 6/24-7/4; trach placed 7/4. CT 7/3 negative for acute process. EEG 7/8 shows non-specific cerebral dysfunction. PMH includes anxiety, depression, fibromyalgia, HTN, CVA (L residual weakness).    Clinical Impression  Pt presents with an overall decrease in functional mobility secondary to above. Pt poor historian; shook head "yes" that she lives alone and uses DME, but unsure further details. Pt restless throughout session, only following simple commands <50% of session. Attempting to pull at lines when BUEs unrestrained. Incontinent of urine requiring maxA (+2 safety) for bed mobility; dependent for pericare. Will progress mobility as pt able to participate. Based on current status, recommend SNF-level therapies to maximize functional mobility and independence prior to return home. Will follow acutely to address established goals.    Follow Up Recommendations SNF;Supervision/Assistance - 24 hour    Equipment Recommendations  (TBD)    Recommendations for Other Services       Precautions / Restrictions Precautions Precautions: Fall;Other (comment) Precaution Comments: Trach/vent, peg, BUE restraints/mitts Restrictions Weight Bearing Restrictions: No      Mobility  Bed Mobility Overal bed mobility: Needs Assistance Bed Mobility: Rolling Rolling: Max assist;+2 for safety/equipment         General bed mobility comments: Pt not following commands consistently. Incontinent of urine requing maxA to roll R/L +2 for safety during pericare (pt attempting to pull at lines throughout). Able to partially long sit up in bed without use of bed  rails  Transfers                 General transfer comment: Not safe to attempt this session  Ambulation/Gait                Stairs            Wheelchair Mobility    Modified Rankin (Stroke Patients Only)       Balance                                             Pertinent Vitals/Pain Pain Assessment: Faces Faces Pain Scale: Hurts a little bit Pain Location: Generalized Pain Descriptors / Indicators: Discomfort Pain Intervention(s): Monitored during session;Repositioned    Home Living Family/patient expects to be discharged to:: Private residence Living Arrangements: Alone               Additional Comments: Pt unreliable historian. Nodded "yes" that she lives alone and uses DME, not responding to any other questions    Prior Function           Comments: Pt poor historian. Per chart two years ago, ambulated with quad cane for longer distances; h/o CVA with L-side residual weakness     Hand Dominance        Extremity/Trunk Assessment   Upper Extremity Assessment Upper Extremity Assessment: Defer to OT evaluation    Lower Extremity Assessment Lower Extremity Assessment: Difficult to assess due to impaired cognition(Not following commands for LE movement. Observed BLE hip/knee ROM WFL; hip/knee strength at least 3/5)       Communication  Communication: Tracheostomy  Cognition Arousal/Alertness: Awake/alert(Moving throughout session, but keeping eyes closed) Behavior During Therapy: Restless Overall Cognitive Status: Difficult to assess                                 General Comments: Pt with inconsistent nodding head yes/no. Following one-step commands <50% of session. Only opening eyes 3-4x with max cues      General Comments      Exercises     Assessment/Plan    PT Assessment Patient needs continued PT services  PT Problem List Decreased strength;Decreased activity tolerance;Decreased  balance;Decreased mobility;Decreased cognition;Decreased knowledge of use of DME;Decreased safety awareness;Cardiopulmonary status limiting activity       PT Treatment Interventions DME instruction;Gait training;Stair training;Functional mobility training;Therapeutic activities;Therapeutic exercise;Balance training;Patient/family education;Cognitive remediation    PT Goals (Current goals can be found in the Care Plan section)  Acute Rehab PT Goals Patient Stated Goal: None stated PT Goal Formulation: Patient unable to participate in goal setting Time For Goal Achievement: 10/04/17    Frequency Min 2X/week   Barriers to discharge Decreased caregiver support      Co-evaluation PT/OT/SLP Co-Evaluation/Treatment: Yes Reason for Co-Treatment: Complexity of the patient's impairments (multi-system involvement);Necessary to address cognition/behavior during functional activity;For patient/therapist safety PT goals addressed during session: Mobility/safety with mobility         AM-PAC PT "6 Clicks" Daily Activity  Outcome Measure Difficulty turning over in bed (including adjusting bedclothes, sheets and blankets)?: Unable Difficulty moving from lying on back to sitting on the side of the bed? : Unable Difficulty sitting down on and standing up from a chair with arms (e.g., wheelchair, bedside commode, etc,.)?: Unable Help needed moving to and from a bed to chair (including a wheelchair)?: A Lot Help needed walking in hospital room?: A Lot Help needed climbing 3-5 steps with a railing? : Total 6 Click Score: 8    End of Session Equipment Utilized During Treatment: Oxygen Activity Tolerance: Other (comment) Patient left: in bed;with call bell/phone within reach;with bed alarm set;with nursing/sitter in room Nurse Communication: Mobility status PT Visit Diagnosis: Other abnormalities of gait and mobility (R26.89)    Time: 0981-1914 PT Time Calculation (min) (ACUTE ONLY): 17  min   Charges:   PT Evaluation $PT Eval High Complexity: 1 High     PT G Codes:       Ina Homes, PT, DPT Acute Rehab Services  Pager: [phone]  Malachy Chamber 09/20/2017, 10:16 AM

## 2017-09-20 NOTE — Evaluation (Signed)
Occupational Therapy Evaluation Patient Details Name: Carla SchlatterBonnie M Stooksbury MRN: [id number] DOB: September 02, 1955 Today's Date: 09/20/2017    History of Present Illness Pt is a 62 y.o. female admitted 09/03/17 with AMS, abdominal pain and diarrhea; worked up for sepsis secondary to colitis vs. pneumonia. Pt with VDRF in setting of MSSA PNA, ARDS, and septic shock. ETT 6/24-7/4; trach placed 7/4. CT 7/3 negative for acute process. EEG 7/8 shows non-specific cerebral dysfunction. PMH includes anxiety, depression, fibromyalgia, HTN, CVA (L residual weakness).   Clinical Impression   Unsure of PLOF; pt only nodding head yes 2x during session and unsure of accuracy of report. Pt requires total assist for ADL and max assist +2 for bed mobility. Pt very inconsistently following simple one step commands and restless throughout session. Recommending SNF for follow up to maximize independence and safety with ADL and functional mobility. Pt would benefit from continued skilled OT to address established goals.    Follow Up Recommendations  SNF;Supervision/Assistance - 24 hour    Equipment Recommendations  Other (comment)(TBD at next venue)    Recommendations for Other Services       Precautions / Restrictions Precautions Precautions: Fall;Other (comment) Precaution Comments: Trach collar, PEG, Bil UE restraints/mitts Restrictions Weight Bearing Restrictions: No      Mobility Bed Mobility Overal bed mobility: Needs Assistance Bed Mobility: Rolling Rolling: Max assist;+2 for safety/equipment         General bed mobility comments: Pt not following commands consistently. Incontinent of urine requing maxA to roll R/L +2 for safety during pericare (pt attempting to pull at lines throughout). Able to partially long sit up in bed without use of bed rails  Transfers                 General transfer comment: Not safe to attempt this session    Balance                                            ADL either performed or assessed with clinical judgement   ADL Overall ADL's : Needs assistance/impaired                                       General ADL Comments: Pt currently total assist for ADL at bed level; pt not following commands consistently enough to participate in functional tasks     Vision         Perception     Praxis      Pertinent Vitals/Pain Pain Assessment: Faces Faces Pain Scale: Hurts a little bit Pain Location: Generalized Pain Descriptors / Indicators: Discomfort Pain Intervention(s): Monitored during session     Hand Dominance     Extremity/Trunk Assessment Upper Extremity Assessment Upper Extremity Assessment: Generalized weakness;Difficult to assess due to impaired cognition(actively moving bil UEs)   Lower Extremity Assessment Lower Extremity Assessment: Defer to PT evaluation       Communication Communication Communication: Tracheostomy   Cognition Arousal/Alertness: Awake/alert(moving but eyes closed throughout) Behavior During Therapy: Restless Overall Cognitive Status: Difficult to assess                                 General Comments: Pt with inconsistent nodding head yes/no. Following one-step commands <50% of  session. Only opening eyes 3-4x with max cues   General Comments  VSS throughout session    Exercises     Shoulder Instructions      Home Living Family/patient expects to be discharged to:: Private residence Living Arrangements: Alone                               Additional Comments: Pt unreliable historian. Nodded "yes" that she lives alone and uses DME, not responding to any other questions      Prior Functioning/Environment          Comments: Pt poor historian. Per chart two years ago, ambulated with quad cane for longer distances; h/o CVA with L-side residual weakness        OT Problem List: Decreased strength;Decreased activity  tolerance;Impaired balance (sitting and/or standing);Decreased cognition;Decreased safety awareness;Decreased knowledge of use of DME or AE;Cardiopulmonary status limiting activity      OT Treatment/Interventions: Self-care/ADL training;Therapeutic exercise;Neuromuscular education;Energy conservation;DME and/or AE instruction;Therapeutic activities;Cognitive remediation/compensation;Patient/family education;Balance training    OT Goals(Current goals can be found in the care plan section) Acute Rehab OT Goals Patient Stated Goal: None stated OT Goal Formulation: Patient unable to participate in goal setting Time For Goal Achievement: 10/04/17 Potential to Achieve Goals: Good ADL Goals Pt Will Perform Grooming: with min assist;sitting Additional ADL Goal #1: Pt will sit EOB x10 minutes with min guard assist during ADL. Additional ADL Goal #2: Pt will follow one step command 4/5 trials.  OT Frequency: Min 2X/week   Barriers to D/C:            Co-evaluation PT/OT/SLP Co-Evaluation/Treatment: Yes Reason for Co-Treatment: Complexity of the patient's impairments (multi-system involvement);Necessary to address cognition/behavior during functional activity;For patient/therapist safety PT goals addressed during session: Mobility/safety with mobility OT goals addressed during session: ADL's and self-care      AM-PAC PT "6 Clicks" Daily Activity     Outcome Measure Help from another person eating meals?: Total Help from another person taking care of personal grooming?: Total Help from another person toileting, which includes using toliet, bedpan, or urinal?: Total Help from another person bathing (including washing, rinsing, drying)?: Total Help from another person to put on and taking off regular upper body clothing?: Total Help from another person to put on and taking off regular lower body clothing?: Total 6 Click Score: 6   End of Session Equipment Utilized During Treatment:  Oxygen Nurse Communication: Other (comment)(cortrak coming out)  Activity Tolerance: Patient tolerated treatment well Patient left: in bed;with call bell/phone within reach;with nursing/sitter in room;with restraints reapplied;with SCD's reapplied  OT Visit Diagnosis: Muscle weakness (generalized) (M62.81);Other abnormalities of gait and mobility (R26.89);Other symptoms and signs involving cognitive function                Time: 1610-9604 OT Time Calculation (min): 17 min Charges:  OT General Charges $OT Visit: 1 Visit OT Evaluation $OT Eval Moderate Complexity: 1 Mod G-Codes:     Isra Lindy A. Brett Albino, M.S., OTR/L Acute Rehab Department: [phone]  Gaye Alken 09/20/2017, 11:15 AM

## 2017-09-21 DIAGNOSIS — G9340 Encephalopathy, unspecified: Secondary | ICD-10-CM

## 2017-09-21 DIAGNOSIS — J9600 Acute respiratory failure, unspecified whether with hypoxia or hypercapnia: Secondary | ICD-10-CM

## 2017-09-21 DIAGNOSIS — Z930 Tracheostomy status: Secondary | ICD-10-CM

## 2017-09-21 LAB — GLUCOSE, CAPILLARY
Glucose-Capillary: 96 mg/dL (ref 70–99)
Glucose-Capillary: 115 mg/dL — ABNORMAL HIGH (ref 70–99)
Glucose-Capillary: 165 mg/dL — ABNORMAL HIGH (ref 70–99)
Glucose-Capillary: 131 mg/dL — ABNORMAL HIGH (ref 70–99)
Glucose-Capillary: 145 mg/dL — ABNORMAL HIGH (ref 70–99)

## 2017-09-21 LAB — COMPREHENSIVE METABOLIC PANEL
ALT: 32 U/L (ref 0–44)
AST: 21 U/L (ref 15–41)
Albumin: 2.2 g/dL — ABNORMAL LOW (ref 3.5–5.0)
Alkaline Phosphatase: 74 U/L (ref 38–126)
Anion gap: 8 (ref 5–15)
BUN: 17 mg/dL (ref 6–20)
CO2: 27 mmol/L (ref 22–32)
Calcium: 8.4 mg/dL — ABNORMAL LOW (ref 8.9–10.3)
Chloride: 104 mmol/L (ref 98–111)
Creatinine, Ser: 0.45 mg/dL (ref 0.44–1.00)
GFR calc Af Amer: >60 mL/min (ref >60)
GFR calc non Af Amer: >60 mL/min (ref >60)
Glucose, Bld: 112 mg/dL — ABNORMAL HIGH (ref 70–99)
Potassium: 3.1 mmol/L — ABNORMAL LOW (ref 3.5–5.1)
Sodium: 139 mmol/L (ref 135–145)
Total Bilirubin: 0.5 mg/dL (ref 0.3–1.2)
Total Protein: 5.9 g/dL — ABNORMAL LOW (ref 6.5–8.1)

## 2017-09-21 LAB — CBC
HCT: 27.8 % — ABNORMAL LOW (ref 36.0–46.0)
Hemoglobin: 8.4 g/dL — ABNORMAL LOW (ref 12.0–15.0)
MCH: 30.7 pg (ref 26.0–34.0)
MCHC: 30.2 g/dL (ref 30.0–36.0)
MCV: 101.5 fL — ABNORMAL HIGH (ref 78.0–100.0)
Platelets: 446 10*3/uL — ABNORMAL HIGH (ref 150–400)
RBC: 2.74 MIL/uL — ABNORMAL LOW (ref 3.87–5.11)
RDW: 14.8 % (ref 11.5–15.5)
WBC: 13.6 10*3/uL — ABNORMAL HIGH (ref 4.0–10.5)

## 2017-09-21 MED ORDER — ORAL CARE MOUTH RINSE
15.0000 mL | Freq: Four times a day (QID) | OROMUCOSAL | Status: DC
  Administered 2017-09-22 – 2017-09-25 (×14): 15 mL via OROMUCOSAL

## 2017-09-21 MED ORDER — HALOPERIDOL LACTATE 5 MG/ML IJ SOLN
1.0000 mg | INTRAMUSCULAR | Status: DC | PRN
  Administered 2017-09-21 (×3): 4 mg via INTRAVENOUS
  Administered 2017-09-21: 2 mg via INTRAVENOUS
  Administered 2017-09-22 (×2): 4 mg via INTRAVENOUS
  Administered 2017-09-23 (×2): 2 mg via INTRAVENOUS
  Administered 2017-09-23 – 2017-09-25 (×2): 4 mg via INTRAVENOUS
  Filled 2017-09-21 (×10): qty 1

## 2017-09-21 MED ORDER — POTASSIUM CHLORIDE 20 MEQ/15ML (10%) PO SOLN
30.0000 meq | ORAL | Status: AC
  Administered 2017-09-21 (×2): 30 meq
  Filled 2017-09-21 (×2): qty 30

## 2017-09-21 NOTE — Progress Notes (Signed)
[facility]OC[facility] FOR AM LAB REPLACEMENT ONLY  The patient does apply for the [facility] Adult ICU Electrolyte Replacment Protocol based on the criteria listed below:   1. Is GFR >/= 40 ml/min? Yes.    Patient's GFR today is >60 2. Is urine output >/= 0.5 ml/kg/hr for the last 6 hours? Yes.   Patient's UOP is 0.91 ml/kg/hr 3. Is BUN < 60 mg/dL? Yes.    Patient's BUN today is 17 4. Abnormal electrolyte(s):Potassium 3.1 5. Ordered repletion with: Potassium per Protocol 6. If a panic level lab has been reported, has the CCM MD in charge been notified? No..   Physician:    Thomasenia BottomsLANTZY, Averianna Brugger P 09/21/2017 4:23 AM

## 2017-09-21 NOTE — Progress Notes (Signed)
RT note: patient placed on 35% trach collar.  Currently tolerating well.  Will continue to monitor.    

## 2017-09-21 NOTE — Care Management Note (Signed)
Case Management Note  Patient Details  Name: Carla SchlatterBonnie M Anstine MRN: [id number] Date of Birth: [birth year]  Subjective/Objective:   Pt is a 62 y.o. female admitted 09/03/17 with AMS, abdominal pain and diarrhea; worked up for sepsis secondary to colitis vs. pneumonia. Pt with VDRF in setting of MSSA PNA, ARDS, and septic shock. PTA, pt resided at home alone, per report.                Action/Plan: LTAC referral received on 09/21/17.  Spoke with pt's sister [name], and discussed choice for area LTAC hospitals.  [facility] is familiar with both, [facility]; she is agreeable to being contacted by admission liaison, Irving Burtonmily.  Notified admissions liaison, who states she will contact family and begin insurance authorization.    Expected Discharge Date:              Expected Discharge Plan:  Long Term Acute Care (LTAC)  In-House Referral:  Clinical Social Work  Discharge planning Services  CM Consult  Post Acute Care Choice:  Long Term Acute Care (LTAC) Choice offered to:     DME Arranged:    DME Agency:     HH Arranged:    HH Agency:     Status of Service:  In process, will continue to follow  If discussed at Long Length of Stay Meetings, dates discussed:    Additional Comments:  Quintella BatonJulie W. Standley Bargo, RN, BSN  Trauma/Neuro ICU Case Manager [phone]

## 2017-09-21 NOTE — Progress Notes (Signed)
Patient in and out cath'ed under sterile procedure. 350cc's of dark, yellow, urine obtained. Patient tolerated well. Will continue to monitor.

## 2017-09-21 NOTE — Progress Notes (Signed)
Patient in and out cath'ed under sterile procedures. 400cc's of dark yellow urine obtained. Patient tolerated well. Will continue to monitor.

## 2017-09-21 NOTE — Clinical Social Work Note (Signed)
Clinical Social Work Assessment  Patient Details  Name: Carla Bowen MRN: [id number] Date of Birth: Mar 05, 1956  Date of referral:  09/21/17               Reason for consult:  Facility Placement, Discharge Planning                Permission sought to share information with:  Facility Medical sales representativeContact Representative, Family Supports Permission granted to share information::  No(patient intubated/trach, unable to communicate)  Name::     Carla Bowen  Agency::  SNFs  Relationship::  sister  Contact Information:  [phone]  Housing/Transportation Living arrangements for the past 2 months:  Single Family Home Source of Information:  Siblings Patient Interpreter Needed:  None Criminal Activity/Legal Involvement Pertinent to Current Situation/Hospitalization:  No - Comment as needed Significant Relationships:  Adult Children, Siblings, Other Family Members Lives with:  Adult Children Do you feel safe going back to the place where you live?  Yes Need for family participation in patient care:  Yes (Comment)  Care giving concerns: Patient from home with son. PT recommending SNF. Medical team planning for SNF vs LTAC pending progress.   Social Worker assessment / plan: Patient intubated/trach'd; CSW unable to assess. CSW spoke to patient's sister, [name], on the phone.  CSW introduced self and role in disposition planning; acknowledged that patient with significant needs at this time, not medically stable. Sister aware that patient will likely need rehab and is agreeable to SNF if indicated. Sister hopeful patient can make meaningful recovery. Sister reported that PTA, patient independent with ADLs. Patient lives with her son [name], and [name] works during the day. CSW to follow for patient progress and disposition planning.  Employment status:    Insurance informationActor:  Managed Medicare([employer]) PT Recommendations:  Skilled Nursing Facility Information / Referral to community resources:  Skilled  Nursing Facility  Patient/Family's Response to care: Patient's sister appreciative of care.  Patient/Family's Understanding of and Emotional Response to Diagnosis, Current Treatment, and Prognosis: Sister with good understanding of patient's condition.   Emotional Assessment Appearance:  Appears stated age Attitude/Demeanor/Rapport:  Unable to Assess Affect (typically observed):  Unable to Assess Orientation:  (unable to assess) Alcohol / Substance use:  Alcohol Use Psych involvement (Current and /or in the community):  No (Comment)  Discharge Needs  Concerns to be addressed:  Discharge Planning Concerns, Care Coordination Readmission within the last 30 days:  No Current discharge risk:  Physical Impairment, Dependent with Mobility Barriers to Discharge:  Continued Medical Work up   Abigail ButtsSusan Syvanna Ciolino, LCSW 09/21/2017, 2:19 PM

## 2017-09-21 NOTE — Evaluation (Signed)
Clinical/Bedside Swallow Evaluation Patient Details  Name: Carla Bowen MRN: [id number] Date of Birth: October 23, 1955  Today's Date: 09/21/2017 Time: SLP Start Time (ACUTE ONLY): 1016 SLP Stop Time (ACUTE ONLY): 1025 SLP Time Calculation (min) (ACUTE ONLY): 9 min  Past Medical History:  Past Medical History:  Diagnosis Date  . Anxiety   . Depression   . Fibromyalgia   . High cholesterol   . Hypertension   . Stroke [facility])    Past Surgical History:  Past Surgical History:  Procedure Laterality Date  . CESAREAN SECTION     HPI:  Pt is a 62 y.o. female admitted 09/03/17 with AMS, abdominal pain and diarrhea; worked up for sepsis secondary to colitis vs. pneumonia. Pt with VDRF in setting of MSSA PNA, ARDS, and septic shock. ETT 6/24-7/4; trach placed 7/4. CT 7/3 negative for acute process. EEG 7/8 shows non-specific cerebral dysfunction. PMH includes anxiety, depression, fibromyalgia, HTN, CVA (L residual weakness).   Assessment / Plan / Recommendation Clinical Impression  Pt is very lethargic this morning, suspect at least in part due to medications as she was recently given haldol. Given Max cues/stimulation she will open her eyes intermittently and stir in the bed, but she is not following commands. She does not cough or swallow to command. SLP provided oral care and tactile stimulation to lips with pt actively shaking her head "no" in response. Although I suspect her ability to participate in PO trials will improve when she is more alert, she will need instrumental testing prior to initiating a PO diet given her risk for aspiration with prolonged intubation, new trach, and altered mentation. Will follow for readiness and for PMV evaluation, which was deferred today given level of lethargy that will hopefully improve on next visit if related to medications administered today. SLP Visit Diagnosis: Dysphagia, unspecified (R13.10)    Aspiration Risk  Severe aspiration risk    Diet  Recommendation NPO;Alternative means - temporary   Medication Administration: Via alternative means    Other  Recommendations Oral Care Recommendations: Oral care QID Other Recommendations: Have oral suction available   Follow up Recommendations Skilled Nursing facility      Frequency and Duration min 3x week  2 weeks       Prognosis Prognosis for Safe Diet Advancement: Good Barriers to Reach Goals: Cognitive deficits      Swallow Study   General HPI: Pt is a 61 y.o. female admitted 09/03/17 with AMS, abdominal pain and diarrhea; worked up for sepsis secondary to colitis vs. pneumonia. Pt with VDRF in setting of MSSA PNA, ARDS, and septic shock. ETT 6/24-7/4; trach placed 7/4. CT 7/3 negative for acute process. EEG 7/8 shows non-specific cerebral dysfunction. PMH includes anxiety, depression, fibromyalgia, HTN, CVA (L residual weakness). Type of Study: Bedside Swallow Evaluation Previous Swallow Assessment: none in chart Diet Prior to this Study: NPO;NG Tube Temperature Spikes Noted: No Respiratory Status: Trach;Trach Collar Trach Size and Type: Cuff;#6;Inflated;With PMSV not in place History of Recent Intubation: Yes Length of Intubations (days): 11 days Date extubated: (trach 7/4) Behavior/Cognition: Lethargic/Drowsy;Doesn't follow directions Oral Cavity Assessment: Other (comment)(difficult to visualize) Oral Care Completed by SLP: Yes Oral Cavity - Dentition: Other (Comment)(difficult to visualize) Self-Feeding Abilities: Refused PO Patient Positioning: Upright in bed Baseline Vocal Quality: Not observed Volitional Cough: Cognitively unable to elicit Volitional Swallow: Unable to elicit    Oral/Motor/Sensory Function Overall Oral Motor/Sensory Function: Other (comment)(cognitively unable to follow commands for assessment)   Ice Chips Ice chips: Not tested  Thin Liquid Thin Liquid: Not tested    Nectar Thick Nectar Thick Liquid: Not tested   Honey Thick Honey Thick  Liquid: Not tested   Puree Puree: Not tested   Solid   GO   Solid: Not tested        [name], Eliany Mccarter 09/21/2017,10:37 AM  Maxcine HamLaura Paiewonsky, M.A. CCC-SLP [phone]

## 2017-09-21 NOTE — Progress Notes (Signed)
62 year old woman with severe anxiety, depression fibromyalgia admitted 6/23 for MSSA pneumonia and ARDS and septic shock, required tracheostomy for prolonged mechanical ventilation on 7/4 He is tolerating few hours on trach collar daily but continues to have severe delirium.  Required Precedex overnight but dropped blood pressure with this.  Afebrile, moving around in bed, appears frail and deconditioned, follows one-step commands intermittently, but not directable, able to move all 4 extremities, decreased breath sounds bilateral, minimal secretions, soft and nontender abdomen.  Labs show hypokalemia and low albumin with stable leukocytosis and anemia.  Last imaging reviewed personally.  Impression/plan  Acute respiratory failure/tracheostomy-continue weaning attempts on trach collar as tolerated in the daytime. -Speech involvement but not ready for Passy-Muir valve yet or cell evaluation  Acute encephalopathy/agitated delirium -continue to taper clonazepam 2.5 twice daily, continue Seroquel for now.  QTC was 330 today, will add Haldol as needed, did not tolerate Precedex  Protein calorie malnutrition-continue tube feeds. MSSA pneumonia/aspiration/sepsis-resolved, completed course of vancomycin and cefepime  Planning for LTAC or SNF if she weans to trach collar   The patient is critically ill with multiple organ systems failure and requires high complexity decision making for assessment and support, frequent evaluation and titration of therapies, application of advanced monitoring technologies and extensive interpretation of multiple databases. Critical Care Time devoted to patient care services described in this note independent of APP/resident  time is 31 minutes.   Comer Locketakesh V. Vassie LollAlva MD

## 2017-09-22 DIAGNOSIS — J189 Pneumonia, unspecified organism: Secondary | ICD-10-CM

## 2017-09-22 LAB — BASIC METABOLIC PANEL
Anion gap: 12 (ref 5–15)
BUN: 23 mg/dL — ABNORMAL HIGH (ref 6–20)
CO2: 27 mmol/L (ref 22–32)
Calcium: 9 mg/dL (ref 8.9–10.3)
Chloride: 102 mmol/L (ref 98–111)
Creatinine, Ser: 0.59 mg/dL (ref 0.44–1.00)
GFR calc Af Amer: >60 mL/min (ref >60)
GFR calc non Af Amer: >60 mL/min (ref >60)
Glucose, Bld: 104 mg/dL — ABNORMAL HIGH (ref 70–99)
Potassium: 4 mmol/L (ref 3.5–5.1)
Sodium: 141 mmol/L (ref 135–145)

## 2017-09-22 LAB — CBC
HCT: 28.8 % — ABNORMAL LOW (ref 36.0–46.0)
Hemoglobin: 8.8 g/dL — ABNORMAL LOW (ref 12.0–15.0)
MCH: 30.9 pg (ref 26.0–34.0)
MCHC: 30.6 g/dL (ref 30.0–36.0)
MCV: 101.1 fL — ABNORMAL HIGH (ref 78.0–100.0)
Platelets: 499 10*3/uL — ABNORMAL HIGH (ref 150–400)
RBC: 2.85 MIL/uL — ABNORMAL LOW (ref 3.87–5.11)
RDW: 14.7 % (ref 11.5–15.5)
WBC: 14.1 10*3/uL — ABNORMAL HIGH (ref 4.0–10.5)

## 2017-09-22 LAB — GLUCOSE, CAPILLARY
Glucose-Capillary: 109 mg/dL — ABNORMAL HIGH (ref 70–99)
Glucose-Capillary: 114 mg/dL — ABNORMAL HIGH (ref 70–99)
Glucose-Capillary: 126 mg/dL — ABNORMAL HIGH (ref 70–99)
Glucose-Capillary: 158 mg/dL — ABNORMAL HIGH (ref 70–99)
Glucose-Capillary: 166 mg/dL — ABNORMAL HIGH (ref 70–99)
Glucose-Capillary: 93 mg/dL (ref 70–99)

## 2017-09-22 LAB — CULTURE, BLOOD (ROUTINE X 2)
Culture: NO GROWTH
Culture: NO GROWTH
Special Requests: ADEQUATE
Special Requests: ADEQUATE

## 2017-09-22 LAB — PHOSPHORUS: Phosphorus: 5.1 mg/dL — ABNORMAL HIGH (ref 2.5–4.6)

## 2017-09-22 LAB — MAGNESIUM: Magnesium: 2 mg/dL (ref 1.7–2.4)

## 2017-09-22 MED ORDER — VITAL AF 1.2 CAL PO LIQD
1000.0000 mL | ORAL | Status: DC
  Administered 2017-09-23 – 2017-09-24 (×2): 1000 mL
  Filled 2017-09-22 (×2): qty 1000

## 2017-09-22 NOTE — Progress Notes (Signed)
Nutrition Follow-up  DOCUMENTATION CODES:   Not applicable  INTERVENTION:    Increase Vital AF 1.2 to 50 ml/h (1200 ml per day)  Provides 1440 kcal, 90 gm protein, 973 ml free water daily.  NUTRITION DIAGNOSIS:   Inadequate oral intake related to inability to eat as evidenced by NPO status.  Ongoing  GOAL:   Patient will meet greater than or equal to 90% of their needs  Met with TF  MONITOR:   Vent status, Labs, I & O's, Weight trends  REASON FOR ASSESSMENT:   Consult Enteral/tube feeding initiation and management  ASSESSMENT:   62 year old female who presented to ED with abdominal pain, diarrhea, and weakness. PMH significant for EtOH abuse, anxiety, depression, hypertension, hyperlipidemia, CVA, and fibromyalgia.  Discussed patient in ICU rounds and with RN today. Patient was on trach collar yesterday, then required vent last night, back on trach collar this morning.  Vital AF 1.2 at 45 ml/h via Cortrak tube providing 100% of estimated needs, tolerating well. Labs reviewed. Phosphorus 5.1 (H) CBG's: [phone] Medications reviewed.   Diet Order:   Diet Order           Diet NPO time specified  Diet effective midnight          EDUCATION NEEDS:   No education needs have been identified at this time  Skin:  Skin Assessment: Reviewed RN Assessment  Last BM:  7/11 (type7)  Height:   Ht Readings from Last 1 Encounters:  09/04/17 '5\' 1"'  (1.549 m)    Weight:   Wt Readings from Last 1 Encounters:  09/22/17 110 lb 0.2 oz (49.9 kg)    Ideal Body Weight:  47.7 kg  BMI:  Body mass index is 20.79 kg/m.  Estimated Nutritional Needs:   Kcal:  1350-1550  Protein:  75-90 grams/day  Fluid:  1.7 L/day    Molli Barrows, RD, LDN, [place] Pager [phone] After Hours Pager [phone]

## 2017-09-22 NOTE — Progress Notes (Signed)
  Speech Language Pathology Treatment: Dysphagia  Patient Details Name: Carla Bowen MRN: [id number] DOB: 1955-08-29 Today's Date: 09/22/2017 Time: [id number] SLP Time Calculation (min) (ACUTE ONLY): 11 min  Assessment / Plan / Recommendation Clinical Impression  Pt is resistive to oral care and oral presentation of ice chips, turning her head away and pursing her lips despite cues and encouragement from SLP. Max cues were provided to attempt coughing, particularly while PMV was in place, to facilitate secretion management. Although spontaneous cough was weak and wet, no volitional coughing could be elicited. Given the above and her reduced alertness, recommend keeping her NPO for now. Prognosis for return to POs likely to improve as mentation does, with instrumental testing still recommended as she becomes more alert.   HPI HPI: Pt is a 62 y.o. female admitted 09/03/17 with AMS, abdominal pain and diarrhea; worked up for sepsis secondary to colitis vs. pneumonia. Pt with VDRF in setting of MSSA PNA, ARDS, and septic shock. ETT 6/24-7/4; trach placed 7/4. CT 7/3 negative for acute process. EEG 7/8 shows non-specific cerebral dysfunction. PMH includes anxiety, depression, fibromyalgia, HTN, CVA (L residual weakness).      SLP Plan  Continue with current plan of care  Patient needs continued Speech Lanaguage Pathology Services    Recommendations  Diet recommendations: NPO Medication Administration: Via alternative means      Patient may use Passy-Muir Speech Valve: with SLP only MD: Please consider changing trach tube to : Cuffless;Smaller size         Oral Care Recommendations: Oral care QID Follow up Recommendations: Skilled Nursing facility SLP Visit Diagnosis: Dysphagia, unspecified (R13.10) Plan: Continue with current plan of care       GO                Carla Bowen, Aerith Canal 09/22/2017, 4:00 PM  Carla HamLaura Bowen, M.A. CCC-SLP [phone]

## 2017-09-22 NOTE — Evaluation (Signed)
Passy-Muir Speaking Valve - Evaluation Patient Details  Name: Carla Bowen MRN: [id number] Date of Birth: 1955-08-20  Today's Date: 09/22/2017 Time: [id number] SLP Time Calculation (min) (ACUTE ONLY): 10 min  Past Medical History:  Past Medical History:  Diagnosis Date  . Anxiety   . Depression   . Fibromyalgia   . High cholesterol   . Hypertension   . Stroke [facility](HCC)    Past Surgical History:  Past Surgical History:  Procedure Laterality Date  . CESAREAN SECTION     HPI:  Pt is a 62 y.o. female admitted 09/03/17 with AMS, abdominal pain and diarrhea; worked up for sepsis secondary to colitis vs. pneumonia. Pt with VDRF in setting of MSSA PNA, ARDS, and septic shock. ETT 6/24-7/4; trach placed 7/4. CT 7/3 negative for acute process. EEG 7/8 shows non-specific cerebral dysfunction. PMH includes anxiety, depression, fibromyalgia, HTN, CVA (L residual weakness).   Assessment / Plan / Recommendation Clinical Impression  Pt is lethargic, keeping her eyes closed more than 75% of the time, but she does arouse with stimulation and starts to become mildly restless. Cuff was deflated with minimal coughing but pt could not wear PMV for more than a few breath cycles at a time. She made an attempt to cough spontaneously, with audible secretions that were not cleared tracheally or orally. Minimal vocalizations noted that were low and rough in quality, but no intentional attempts to verbalize. After a few seconds with the valve in place her rate and work of breathing increase, and there is air trapping evident upon valve removal. Decreased upper airway access could be in part from secretions, although it could also be trach size. Will continue to follow and progress as able. For now, would use PMV with SLP only. SLP Visit Diagnosis: Aphonia (R49.1)    SLP Assessment  Patient needs continued Speech Lanaguage Pathology Services    Follow Up Recommendations  Skilled Nursing facility    Frequency  and Duration min 3x week  2 weeks    PMSV Trial PMSV was placed for: few breath cycles Able to redirect subglottic air through upper airway: Yes Able to Attain Phonation: Yes Voice Quality: Hoarse;Low vocal intensity Able to Expectorate Secretions: No Breath Support for Phonation: Moderately decreased Intelligibility: Unable to assess (comment) Respirations During Trial: (elevated to into the 30s) SpO2 During Trial: (WFL) Pulse During Trial: (WFL) Behavior: Lethargic;Other (comment)(restless, no attempts to verbalize)   Tracheostomy Tube       Vent Dependency       Cuff Deflation Trial  GO Tolerated Cuff Deflation: Yes Length of Time for Cuff Deflation Trial: ~10 min Behavior: Other (comment)(lethargic)        [name], Joleene Burnham 09/22/2017, 3:56 PM   Maxcine HamLaura Paiewonsky, M.A. CCC-SLP [phone]

## 2017-09-22 NOTE — Progress Notes (Signed)
62 year old woman with severe anxiety, depression fibromyalgia admitted 6/23 for MSSA pneumonia and ARDS and septic shock, required tracheostomy for prolonged mechanical ventilation on 7/4   She tolerated trach collar all day on 7/11. Remains delirious and moving around in bed, placed in restraints for safety reasons.  Low-grade fever, appears frail and deconditioned, not sedation overnight so appears lethargic, but opens eyes and follows one-step commands intermittently, nonfocal otherwise, decreased breath sounds with minimal secretions, soft and nontender abdomen.  Labs show normal electrolytes, mild leukocytosis and anemia.  Impression/plan Acute respiratory failure/tracheostomy-continue trach collar as tolerated day and night -Proceed with Passy-Muir valve placement  Acute encephalopathy-continue Seroquel and low-dose clonazepam, QTC was 0.38, okay to use Haldol as needed, has not tolerated Precedex.  Hope that this will improve with physical therapy and Passy-Muir valve.  MSSA pneumonia/sepsis/aspiration-resolved, completed cefepime and vancomycin Protein calorie malnutrition-continue tube feeds.  Plan for LTAC if insurance permits, otherwise SNF if she weans to trach collar successfully. PCCM to follow intermittently  Cyril Mourningakesh Judea Riches MD. FCCP. [place] Pager [phone] If no response call [phone]   09/22/2017

## 2017-09-22 NOTE — Progress Notes (Signed)
PROGRESS NOTE    Carla Bowen  ZCH:[id number] DOB: 09-18-1955 DOA: 09/03/2017 PCP: Hayden Rasmussen, MD    Brief Narrative:  62 year old woman with severe anxiety, depression fibromyalgia admitted 6/23 for MSSA pneumonia and ARDS and septic shock, required tracheostomy for prolonged mechanical ventilation on 7/4, has been on trach collar on and off since 7/10. She is transferred from [facility] service to [facility] on 7/12. Overnight she required vent , but she is back on trach collar. She is alert, opens her eyes but she is not following any commands.    Assessment & Plan:   Principal Problem:   Sepsis ([place]) Active Problems:   Abdominal pain   Diarrhea   Fever   Tachycardia   ARF (acute renal failure) (HCC)   Acute ischemic colitis ([place])   Acute respiratory failure (HCC)   Acute respiratory failure with hypoxia secondary to MSSA pneumonia s/p intubation on 6/24, multiple failed extubations and  tracheostomy done on 7/4.  Completed the course of IV antibiotics. Pt required vent overnight but is trach collar this am.  As per PCCM, Passy Muir valve placement today.  PCCM will be on board for trach care.    Acute encephalopathy  Suspect from acute respiratory failure with hypoxia, pt has a h/o fo CVA, with some residual hemiparesis. On talking to his son, he reports she was independent of ADL'S prior to this hospitalization. Will get MRI brain without contrast for evaluation of encephalopathy .  EEG showed generalized non specific cerebral dysfunction but no seizure activity.    Delirium:  Probably secondary to benzodiazepine withdrawal. With generalized weakness Supportive management and clonazepam taper.     Sepsis from MSSA Pneumonia/ aspiration pneumonia:  Resolved.  Completed the course of IV antibiotics.    Severe protein calorie malnutrition:  Nutrition consulted . Currently on tube feeds via NG tube.    Anemia macrocytic.  Stable around 8.  Transfuse to keep  hemoglobin greater than 7.    Hypokalemia: replaced.   Atrial fibrillation with RVR In sinus rhythm at this time.  On cardizem and BB for rate control.  Echocardiogram shows normal LVEF and normal diastolic parameters.    Hyperlipidemia:  On crestor.    Depression:  On Wellbutrin.    DVT prophylaxis: heparin. Code Status: full code.  Family Communication: none at bedside.  Disposition Plan: pending clinical improvement.    Consultants:   PCCM for trach care.   Procedures: Tracheostomy on 7/4 (#6 cuffed shiley)   Antimicrobials: completed the course of antibiotics for MSSA PNEUMONIA.    Subjective: Does not appear to be in distress.   Objective: Vitals:   09/22/17 1000 09/22/17 1122 09/22/17 1127 09/22/17 1200  BP: (!) 96/59   (!) 78/42  Pulse: 99 (!) 102  83  Resp: 17 (!) 22  16  Temp:   98.9 F (37.2 C)   TempSrc:   Axillary   SpO2: 97% 93%  99%  Weight:      Height:        Intake/Output Summary (Last 24 hours) at 09/22/2017 1224 Last data filed at 09/22/2017 1100 Gross per 24 hour  Intake 1424.88 ml  Output 150 ml  Net 1274.88 ml   Filed Weights   09/20/17 0405 09/21/17 0100 09/22/17 0400  Weight: 50.2 kg (110 lb 10.7 oz) 48.5 kg (106 lb 14.8 oz) 49.9 kg (110 lb 0.2 oz)    Examination:  General exam: Appears calm and comfortable on trach collar on 5lit of OXYGEN.  Respiratory system: diminished at bases, no wheezing or rhonchi.  Cardiovascular system: S1 & S2 heard, RRR. No JVD, . No pedal edema. Gastrointestinal system: Abdomen is nondistended, soft and nontender. No organomegaly or masses felt. Normal bowel sounds heard. Central nervous system:alert, opens eyes but not following commands.  Extremities: no pedal edema.  Skin: No rashes, lesions or ulcers Psychiatry: calm, not agitated.     Data Reviewed: I have personally reviewed following labs and imaging studies  CBC: Recent Labs  Lab 09/16/17 0312 09/19/17 0346 09/20/17 0413  09/21/17 0312 09/22/17 0300  WBC 9.4 12.7* 13.6* 13.6* 14.1*  HGB 7.7* 8.2* 8.2* 8.4* 8.8*  HCT 24.6* 26.9* 27.2* 27.8* 28.8*  MCV 100.0 101.9* 101.5* 101.5* 101.1*  PLT 411* 485* 453* 446* 537*   Basic Metabolic Panel: Recent Labs  Lab 09/16/17 0312  09/17/17 0017 09/18/17 0328 09/19/17 0346 09/20/17 0413 09/21/17 0312 09/22/17 0300  NA 142   < > 142 145 142 142 139 141  K 2.9*   < > 3.9 3.2* 3.7 3.9 3.1* 4.0  CL 107   < > 108 108 105 102 104 102  CO2 29   < > _0 GLUCOSE 163*   < > 139* 145* 131* 110* 112* 104*  BUN 9   < > _1 23*  CREATININE 0.46   < > 0.51 0.42* 0.48 0.52 0.45 0.59  CALCIUM 8.1*   < > 8.4* 8.5* 8.7* 8.8* 8.4* 9.0  MG 1.8  --  2.0 1.8  --   --   --  2.0  PHOS  --   --  3.1 4.0  --   --   --  5.1*   < > = values in this interval not displayed.   GFR: Estimated Creatinine Clearance: 57.1 mL/min (by C-G formula based on SCr of 0.59 mg/dL). Liver Function Tests: Recent Labs  Lab 09/20/17 0413 09/21/17 0312  AST 30 21  ALT 42 32  ALKPHOS 79 74  BILITOT 0.4 0.5  PROT 6.2* 5.9*  ALBUMIN 2.2* 2.2*   No results for input(s): LIPASE, AMYLASE in the last 168 hours. Recent Labs  Lab 09/20/17 0413  AMMONIA 20   Coagulation Profile: Recent Labs  Lab 09/20/17 0413  INR 1.02   Cardiac Enzymes: No results for input(s): CKTOTAL, CKMB, CKMBINDEX, TROPONINI in the last 168 hours. BNP (last 3 results) No results for input(s): PROBNP in the last 8760 hours. HbA1C: No results for input(s): HGBA1C in the last 72 hours. CBG: Recent Labs  Lab 09/21/17 1930 09/21/17 2331 09/22/17 0317 09/22/17 0807 09/22/17 1126  GLUCAP 145* 158* 93 126* 166*   Lipid Profile: No results for input(s): CHOL, HDL, LDLCALC, TRIG, CHOLHDL, LDLDIRECT in the last 72 hours. Thyroid Function Tests: No results for input(s): TSH, T4TOTAL, FREET4, T3FREE, THYROIDAB in the last 72 hours. Anemia Panel: No results for input(s): VITAMINB12, FOLATE,  FERRITIN, TIBC, IRON, RETICCTPCT in the last 72 hours. Sepsis Labs: No results for input(s): PROCALCITON, LATICACIDVEN in the last 168 hours.  Recent Results (from the past 240 hour(s))  Culture, blood (routine x 2)     Status: None (Preliminary result)   Collection Time: 09/17/17 12:17 AM  Result Value Ref Range Status   Specimen Description BLOOD LEFT HAND  Final   Special Requests   Final    BOTTLES DRAWN AEROBIC AND ANAEROBIC Blood Culture adequate volume   Culture   Final    NO  GROWTH 4 DAYS Performed at [facility], [place] [address] [place], [place] [address]    Report Status PENDING  Incomplete  Culture, blood (routine x 2)     Status: None   Collection Time: 09/17/17 12:22 AM  Result Value Ref Range Status   Specimen Description BLOOD LEFT HAND  Final   Special Requests   Final    BOTTLES DRAWN AEROBIC ONLY Blood Culture adequate volume   Culture   Final    NO GROWTH 5 DAYS Performed at [facility], [place] [address], [place], [place] [address]    Report Status 09/22/2017 FINAL  Final  Culture, respiratory (NON-Expectorated)     Status: None (Preliminary result)   Collection Time: 09/22/17 10:15 AM  Result Value Ref Range Status   Specimen Description TRACHEAL ASPIRATE  Final   Special Requests NONE  Final   Gram Stain   Final    MODERATE WBC PRESENT, PREDOMINANTLY PMN FEW GRAM POSITIVE COCCI FEW GRAM POSITIVE RODS RARE YEAST WITH PSEUDOHYPHAE Performed at [facility], [place] [address] [place], [place] [address]    Culture PENDING  Incomplete   Report Status PENDING  Incomplete         Radiology Studies: No results found.      Scheduled Meds: . amLODipine  10 mg Per Tube Daily  . ARIPiprazole  5 mg Per Tube Daily  . buPROPion  50 mg Per Tube BID  . chlorhexidine gluconate (MEDLINE KIT)  15 mL Mouth Rinse BID  . Chlorhexidine Gluconate Cloth  6 each Topical Daily  . clonazePAM  0.5 mg Per Tube BID  . diltiazem  60 mg Per Tube Q6H  .  famotidine  20 mg Per Tube Q12H  . folic acid  1 mg Per Tube Daily  . gabapentin  100 mg Per Tube QHS  . heparin  5,000 Units Subcutaneous Q8H  . insulin aspart  0-15 Units Subcutaneous Q4H  . lamoTRIgine  25 mg Per Tube BID  . mouth rinse  15 mL Mouth Rinse QID  . Melatonin  3 mg Per Tube QHS  . metoprolol tartrate  25 mg Per Tube BID  . nicotine  14 mg Transdermal Daily  . QUEtiapine  100 mg Per Tube BID  . rosuvastatin  10 mg Per Tube QHS  . sodium chloride flush  10-40 mL Intracatheter Q12H  . thiamine  100 mg Per Tube Daily   Continuous Infusions: . sodium chloride 250 mL (09/22/17 0700)  . feeding supplement (VITAL AF 1.2 CAL)       LOS: 19 days    Time spent: 35 minutes.     Hosie Poisson, MD [facility] Pager [phone] If 7PM-7AM, please contact night-coverage [email or web address] Password [facility] 09/22/2017, 12:24 PM

## 2017-09-22 NOTE — Progress Notes (Signed)
Physical Therapy Treatment Patient Details Name: Carla Bowen MRN: [id number] DOB: 12/17/1955 Today's Date: 09/22/2017    History of Present Illness Pt is a 62 y.o. female admitted 09/03/17 with AMS, abdominal pain and diarrhea; worked up for sepsis secondary to colitis vs. pneumonia. Pt with VDRF in setting of MSSA PNA, ARDS, and septic shock. ETT 6/24-7/4; trach placed 7/4. CT 7/3 negative for acute process. EEG 7/8 shows non-specific cerebral dysfunction. PMH includes anxiety, depression, fibromyalgia, HTN, CVA (L residual weakness).   PT Comments    Pt lethargic, opening eyes a few times during session but not following commands. TotalA to sit EOB in attempt to arouse pt. Pt able to sit EOB >10 min with mod-maxA, intermittent bouts of activation of trunk/cervical extensors with max tactile cues. Pt strongly resisting bilat elbow and knee extension.   Resting BP 100/153, seated BP 134/67 HR 93 SpO2 98% on 5L O2 via trach collar at 28% FiO2   Follow Up Recommendations  SNF;Supervision/Assistance - 24 hour     Equipment Recommendations  (TBD)    Recommendations for Other Services       Precautions / Restrictions Precautions Precautions: Fall;Other (comment) Precaution Comments: Trach/vent, PEG, Bil UE/LE restraints, soft mitts Restrictions Weight Bearing Restrictions: No    Mobility  Bed Mobility Overal bed mobility: Needs Assistance Bed Mobility: Supine to Sit;Sit to Supine     Supine to sit: Total assist;+2 for physical assistance;+2 for safety/equipment Sit to supine: Total assist;+2 for physical assistance;+2 for safety/equipment   General bed mobility comments: Pt not assisting for mobility, eyes closed through despite max simulation  Transfers                 General transfer comment: Not safe to attempt this session  Ambulation/Gait                 Stairs             Wheelchair Mobility    Modified Rankin (Stroke Patients Only)        Balance Overall balance assessment: Needs assistance Sitting-balance support: No upper extremity supported;Feet unsupported Sitting balance-Leahy Scale: Poor Sitting balance - Comments: Able to maintain sitting balance a couple seconds with min guard and significant posterior lean; required mod-maxA to maintain majority of time. Max tactile cues for trunk and cervical extension; pt attempting to pick head up 2x Postural control: Posterior lean                                  Cognition Arousal/Alertness: Lethargic Behavior During Therapy: Flat affect Overall Cognitive Status: Difficult to assess                                 General Comments: Decline in interaction since evaluation. Pt only opening eyes a few times to max tactile/verbal stimuli. Not following any commands or nodding head yes/no      Exercises Other Exercises Other Exercises: Strongly resisting elbow extension and knee extension    General Comments        Pertinent Vitals/Pain Pain Assessment: Faces Faces Pain Scale: Hurts a little bit Pain Location: With neck and LE PROM Pain Descriptors / Indicators: Grimacing Pain Intervention(s): Monitored during session    Home Living  Prior Function            PT Goals (current goals can now be found in the care plan section) Acute Rehab PT Goals Patient Stated Goal: None stated PT Goal Formulation: Patient unable to participate in goal setting Time For Goal Achievement: 10/04/17 Progress towards PT goals: Not progressing toward goals - comment(Limited by lethargy)    Frequency    Min 2X/week      PT Plan Current plan remains appropriate    Co-evaluation              AM-PAC PT "6 Clicks" Daily Activity  Outcome Measure  Difficulty turning over in bed (including adjusting bedclothes, sheets and blankets)?: Unable Difficulty moving from lying on back to sitting on the side of the  bed? : Unable Difficulty sitting down on and standing up from a chair with arms (e.g., wheelchair, bedside commode, etc,.)?: Unable Help needed moving to and from a bed to chair (including a wheelchair)?: Total Help needed walking in hospital room?: Total Help needed climbing 3-5 steps with a railing? : Total 6 Click Score: 6    End of Session Equipment Utilized During Treatment: Oxygen Activity Tolerance: Patient limited by lethargy Patient left: in bed;with call bell/phone within reach;with bed alarm set;with restraints reapplied Nurse Communication: Mobility status PT Visit Diagnosis: Other abnormalities of gait and mobility (R26.89)     Time: [id number] PT Time Calculation (min) (ACUTE ONLY): 24 min  Charges:  $Therapeutic Activity: 8-22 mins                    G Codes:      Ina HomesJaclyn Ellysa Parrack, PT, DPT Acute Rehab Services  Pager: [phone]  Malachy ChamberJaclyn L Franck Vinal 09/22/2017, 2:45 PM

## 2017-09-23 ENCOUNTER — Inpatient Hospital Stay (HOSPITAL_COMMUNITY): Payer: Medicare Other

## 2017-09-23 DIAGNOSIS — Z430 Encounter for attention to tracheostomy: Secondary | ICD-10-CM

## 2017-09-23 DIAGNOSIS — G9340 Encephalopathy, unspecified: Secondary | ICD-10-CM

## 2017-09-23 DIAGNOSIS — J15211 Pneumonia due to Methicillin susceptible Staphylococcus aureus: Secondary | ICD-10-CM

## 2017-09-23 DIAGNOSIS — Z930 Tracheostomy status: Secondary | ICD-10-CM

## 2017-09-23 DIAGNOSIS — E43 Unspecified severe protein-calorie malnutrition: Secondary | ICD-10-CM

## 2017-09-23 LAB — GLUCOSE, CAPILLARY
Glucose-Capillary: 125 mg/dL — ABNORMAL HIGH (ref 70–99)
Glucose-Capillary: 127 mg/dL — ABNORMAL HIGH (ref 70–99)
Glucose-Capillary: 130 mg/dL — ABNORMAL HIGH (ref 70–99)
Glucose-Capillary: 134 mg/dL — ABNORMAL HIGH (ref 70–99)
Glucose-Capillary: 184 mg/dL — ABNORMAL HIGH (ref 70–99)
Glucose-Capillary: 96 mg/dL (ref 70–99)

## 2017-09-23 NOTE — Progress Notes (Signed)
62 year old woman with severe anxiety, depression fibromyalgia admitted 6/23 for MSSA pneumonia and ARDS and septic shock, required tracheostomy for prolonged mechanical ventilation on 7/4  She has tolerated trach collar for 24 hours. Main issue continues to be severe delirium requiring restraints and intermittent sedation with fentanyl, Versed and Haldol Secretions are minimal but was unable to tolerate Passy-Muir valve.  Respiratory culture from 7/12 is pending.  Impression/plan  Acute respiratory failure/tracheostomy-has been liberated from the ventilator and hopefully will continue to tolerate trach collar -Proceed with Passy-Muir valve as tolerated and swallow evaluation.  Acute encephalopathy/delirium-continue Seroquel, can discontinue clonazepam, use Haldol as needed. Again hoping that this will improve with physical therapy and when she is able to speak.  MSSA pneumonia/sepsis/aspiration-resolved Severe protein calorie malnutrition-continue tube feeds  Can transfer out of ICU, PCCM to see again on Monday  Carla Mourningakesh Mckynlie Vanderslice MD. [facility] InstituteFCCP. [place] [facility] Pager [phone] If no response call [phone]   09/23/2017

## 2017-09-23 NOTE — Plan of Care (Signed)
Discussed with patient plan of care for the evening and going to MRI with no teach displayed at this time

## 2017-09-23 NOTE — Progress Notes (Signed)
Patient unable to stay calm in MRI even after second dose of Haldol 2 mg to each a total of 4 mg.  Patient upon return still restless and agitated removed bilateral legs restraints for the moment.

## 2017-09-23 NOTE — Progress Notes (Signed)
PROGRESS NOTE  Carla Bowen NWG:[id number] DOB: 03/06/1956 DOA: 09/03/2017 PCP: Hayden Rasmussen, MD  Brief Narrative:   Assessment/Plan Acute respiratory failure/tracheostomy, secondary to MSSA pneumonia, failed multiple expectorations, tracheostomy performed 7/4.  Successfully liberated from ventilator, continues on trach collar. --Continue management per pulmonology, plan for PM valve and swallow evaluation as tolerated  Acute encephalopathy/delirium.  Thought to be secondary to acute illness.  Reportedly has history of CVA, residual hemiparesis.  Per chart patient was independent with ADLs prior to hospitalization.  EEG was nonspecific.  MRI brain pending. --Continue Seroquel and Haldol as recommended by PCCM.  MSSA pneumonia/sepsis/aspiration --Treated with antibiotics, resolved.  Atrial fibrillation with rapid ventricular response.  Described as being in sinus rhythm. --Continue beta-blocker, Cardizem.  Echocardiogram normal LVEF.  Severe protein calorie malnutrition --Continue tube feeds.  DVT prophylaxis: heparin SQ Code Status: Full Family Communication: none Disposition Plan: pending    Murray Hodgkins, MD  [facility] Direct contact: [phone] --Via amion app OR  --[email or web address]; password [email or web address]  7PM-7AM contact night coverage as above 09/23/2017, 9:39 AM  LOS: 20 days   Consultants:  PCCM  Procedures:  Echo Study Conclusions  - Left ventricle: The cavity size was normal. Systolic function was   normal. The estimated ejection fraction was in the range of 55%   to 60%. Although no diagnostic regional wall motion abnormality   was identified, this possibility cannot be completely excluded on   the basis of this study. Left ventricular diastolic function   parameters were normal.  Antimicrobials:  None now  Interval history/Subjective: Discussed with RN, patient remains encephalopathic but she does follow commands at times. Patient does  awaken and follow simple commands but does not attempt to speak  Objective: Vitals:  Vitals:   09/23/17 0800 09/23/17 0900  BP: 126/79 (!) 112/53  Pulse: (!) 104 91  Resp: (!) 27   Temp:    SpO2: 94% 97%    Exam:  Constitutional:  . Appears calm and comfortable.  Tracheostomy.  Both hands in mittens. Eyes:  . pupils appear normal ENMT:  . grossly normal hearing as assessed by response to commands. . Lips appear normal Respiratory:  . CTA bilaterally, no w/r/r.  . Respiratory effort normal.  Tracheostomy in place. Cardiovascular:  . RRR, no m/r/g . No LE extremity edema   Abdomen:  . Soft, nontender, nondistended Musculoskeletal:  . RUE, LUE, RLE, LLE   o Moves all extremities.  Grossly normal tone. Psychiatric:  . Mental status . Appears encephalopathic but does follow simple commands  I have personally reviewed the following:   Labs:  Blood sugars are stable.  No labs today.  Basic metabolic panel, magnesium unremarkable yesterday  CBC without significant change yesterday, WBC 14.1 hemoglobin 8.8, platelets 499.  Imaging studies:  Last chest x-ray showed airspace consolidation left upper lobe, left base regions which was described as stable.  Left pleural effusion.   Scheduled Meds: . amLODipine  10 mg Per Tube Daily  . ARIPiprazole  5 mg Per Tube Daily  . buPROPion  50 mg Per Tube BID  . chlorhexidine gluconate (MEDLINE KIT)  15 mL Mouth Rinse BID  . Chlorhexidine Gluconate Cloth  6 each Topical Daily  . diltiazem  60 mg Per Tube Q6H  . famotidine  20 mg Per Tube Q12H  . folic acid  1 mg Per Tube Daily  . gabapentin  100 mg Per Tube QHS  . heparin  5,000 Units Subcutaneous Q8H  . insulin aspart  0-15 Units Subcutaneous Q4H  . lamoTRIgine  25 mg Per Tube BID  . mouth rinse  15 mL Mouth Rinse QID  . Melatonin  3 mg Per Tube QHS  . metoprolol tartrate  25 mg Per Tube BID  . nicotine  14 mg Transdermal Daily  . QUEtiapine  100 mg Per Tube BID  .  rosuvastatin  10 mg Per Tube QHS  . sodium chloride flush  10-40 mL Intracatheter Q12H  . thiamine  100 mg Per Tube Daily   Continuous Infusions: . sodium chloride Stopped (09/23/17 0755)  . feeding supplement (VITAL AF 1.2 CAL) 1,000 mL (09/23/17 0203)    Principal Problem:   Sepsis ([place]) Active Problems:   Abdominal pain   Diarrhea   Fever   Tachycardia   ARF (acute renal failure) ([place])   Acute ischemic colitis ([place])   Acute respiratory failure ([facility])   LOS: 20 days

## 2017-09-23 NOTE — Progress Notes (Signed)
1813:Dr. Irene LimboGoodrich paged the following:  "[id number]: [place]. May we renew patients restraints? expires at 7pm. Thank you"

## 2017-09-23 NOTE — Progress Notes (Signed)
SLP Cancellation Note  Patient Details Name: Carla Bowen MRN: [id number] DOB: Jul 26, 1955   Cancelled treatment:       Reason Eval/Treat Not Completed: Fatigue/lethargy limiting ability to participate   Donavan Burnetamara Fryda Molenda, MS [facility] SLP [phone]

## 2017-09-23 NOTE — Progress Notes (Signed)
MRI attempted with 5mg  of haldol, unsuccessful, RN aware pt sent back to floor

## 2017-09-24 ENCOUNTER — Encounter (HOSPITAL_COMMUNITY): Payer: Self-pay | Admitting: Family Medicine

## 2017-09-24 DIAGNOSIS — F10129 Alcohol abuse with intoxication, unspecified: Secondary | ICD-10-CM

## 2017-09-24 DIAGNOSIS — D539 Nutritional anemia, unspecified: Secondary | ICD-10-CM

## 2017-09-24 DIAGNOSIS — F10239 Alcohol dependence with withdrawal, unspecified: Secondary | ICD-10-CM

## 2017-09-24 DIAGNOSIS — F10939 Alcohol use, unspecified with withdrawal, unspecified: Secondary | ICD-10-CM

## 2017-09-24 DIAGNOSIS — I700 Atherosclerosis of aorta: Secondary | ICD-10-CM

## 2017-09-24 HISTORY — DX: Atherosclerosis of aorta: I70.0

## 2017-09-24 LAB — CULTURE, RESPIRATORY W GRAM STAIN: Culture: NORMAL

## 2017-09-24 LAB — GLUCOSE, CAPILLARY
Glucose-Capillary: 123 mg/dL — ABNORMAL HIGH (ref 70–99)
Glucose-Capillary: 130 mg/dL — ABNORMAL HIGH (ref 70–99)
Glucose-Capillary: 131 mg/dL — ABNORMAL HIGH (ref 70–99)
Glucose-Capillary: 142 mg/dL — ABNORMAL HIGH (ref 70–99)
Glucose-Capillary: 143 mg/dL — ABNORMAL HIGH (ref 70–99)
Glucose-Capillary: 147 mg/dL — ABNORMAL HIGH (ref 70–99)

## 2017-09-24 MED ORDER — ADULT MULTIVITAMIN LIQUID CH
15.0000 mL | Freq: Every day | ORAL | Status: DC
  Administered 2017-09-25: 15 mL
  Filled 2017-09-24: qty 15

## 2017-09-24 MED ORDER — VITAL AF 1.2 CAL PO LIQD
1500.0000 mL | ORAL | Status: DC
  Administered 2017-09-24: 1500 mL
  Filled 2017-09-24 (×4): qty 1500

## 2017-09-24 NOTE — Progress Notes (Signed)
PROGRESS NOTE  Carla Bowen WFU:[id number] DOB: [birth year] DOA: 09/03/2017 PCP: Hayden Rasmussen, MD  Brief Narrative: 62 year old woman PMH alcohol abuse, presented with abdominal pain, diarrhea, fever.  Admitted for pneumonia, infectious or inflammatory colitis, sepsis, acute kidney injury.  Remained hypotensive despite fluids and was transferred to PCCM, required intubation for respiratory failure and vasopressors for shock.  Seen by general surgery and gastroenterology for abnormal colonic findings on CT, thought to be ischemic colitis, treated with conservative management and both these disciplines signed off.  Found to have MSSA pneumonia, aspiration pneumonia, shock, ARDS.  Hospitalization was complicated by inability to wean and profound agitation, unresponsive to Precedex, eventually requiring tracheostomy.  Assessment/Plan Acute hypoxic respiratory failure secondary to MSSA pneumonia, aspiration pneumonia, ARDS, septic shock requiring vasopressors.  Failed multiple weaning attempts secondary to agitation.  Status post tracheostomy 7/4.  Completed antibiotics. --Continue tracheostomy care per PCCM.  Continue trach collar.  Acute encephalopathy, delirium.  Very slow to improve.  EEG nonspecific.  CT head negative for acute disease x2.  MRI brain thus far not obtainable secondary to agitation.  Unresponsive to Precedex. --Follow some simple commands.  Continue chronic Abilify, Wellbutrin  Alcohol abuse, possible dependence; with severe alcohol withdrawal. Ethanol level 247 on admission.  Did not tolerate Precedex. --See folate, thiamine, multivitamin  TSH 0.082 on admission; was also low 0.131 12/24/2015, although free T4 was normal at that time. --Significance unclear.  Check repeat TSH, free T4  Depression, anxiety --Continue Abilify, Wellbutrin, Lamictal.  Resume Cymbalta when able to take p.o.  Severe protein calorie malnutrition --Continue tube feeds  Macrocytic anemia  stable.  Presumably related to alcohol abuse. B-12 and folate adequate --Remains stable.  Transient atrial fibrillation witmh rapid ventricular response.   --Continue metoprolol, diltiazem.  Echocardiogram normal LVEF.  No anticoagulation at this point.  Ischemic colitis --Resolved with supportive care.  AKI --Resolved with fluids.  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  Cigarette smoker 1.5 packs/day.  Aortic atherosclerosis  DVT prophylaxis: heparin SQ Code Status: Full Family Communication: none Disposition Plan: pending    Murray Hodgkins, MD  [facility] Direct contact: [phone] --Via amion app OR  --[email or web address]; password [email or web address]  7PM-7AM contact night coverage as above 09/24/2017, 3:54 PM  LOS: 21 days   Consultants:  PCCM  General surgery  GI  Procedures:  ETT 6/24 >> 6/28 (self-extubation, immediately re-intubated) >>  PICC 7/1  7/4 Video Bronchoscopy for trach  7/4 Percutaneous Tracheostomy Placement  7/9 EEG Clinical Interpretation: This EEG is consistent with a generalized non-specific cerebral dysfunction(encephalopathy). There was no seizure or seizure predisposition recorded on this study. Please note that a normal EEG does not preclude the possibility of epilepsy.   Cortak tube 7/10  Echo Study Conclusions  - Left ventricle: The cavity size was normal. Systolic function was   normal. The estimated ejection fraction was in the range of 55%   to 60%. Although no diagnostic regional wall motion abnormality   was identified, this possibility cannot be completely excluded on   the basis of this study. Left ventricular diastolic function   parameters were normal.  Antimicrobials:  None now  Interval history/Subjective: Remains encephalopathic, anxious, trying to get out of  bed.  Objective: Vitals:  Vitals:   09/24/17 1219 09/24/17 1524  BP: 122/70   Pulse: 96 100  Resp: (!) 25 20  Temp: 98.2 F (36.8 C)  SpO2: 97% 98%    Exam: Constitutional:   . Anxious, irritable Eyes:  . pupils appear normal ENMT:  . grossly normal hearing  Respiratory:  . CTA bilaterally, no w/r/r . Respiratory effort normal Cardiovascular:  . RRR, no m/r/g . No LE extremity edema   . Telemetry sinus rhythm, brief episodes of rapid heart rate, suspect intermittent SVT. Musculoskeletal:  . RUE, LUE, RLE, LLE   . All commands but strength and tone in all extremities appears grossly normal. Skin:  . No rashes, lesions, ulcers seen Psychiatric:  . Mental status o Cannot assess mood or affect.  Cannot assess orientation.  Follows simple commands, by moving arms and legs and opening mouth, sticking out tongue.  I have personally reviewed the following:   Data:  Blood sugars remain stable  Scheduled Meds: . amLODipine  10 mg Per Tube Daily  . ARIPiprazole  5 mg Per Tube Daily  . buPROPion  50 mg Per Tube BID  . chlorhexidine gluconate (MEDLINE KIT)  15 mL Mouth Rinse BID  . Chlorhexidine Gluconate Cloth  6 each Topical Daily  . diltiazem  60 mg Per Tube Q6H  . famotidine  20 mg Per Tube Q12H  . folic acid  1 mg Per Tube Daily  . gabapentin  100 mg Per Tube QHS  . heparin  5,000 Units Subcutaneous Q8H  . insulin aspart  0-15 Units Subcutaneous Q4H  . lamoTRIgine  25 mg Per Tube BID  . mouth rinse  15 mL Mouth Rinse QID  . Melatonin  3 mg Per Tube QHS  . metoprolol tartrate  25 mg Per Tube BID  . [START ON 09/25/2017] multivitamin  15 mL Per Tube Daily  . nicotine  14 mg Transdermal Daily  . QUEtiapine  100 mg Per Tube BID  . rosuvastatin  10 mg Per Tube QHS  . sodium chloride flush  10-40 mL Intracatheter Q12H  . thiamine  100 mg Per Tube Daily   Continuous Infusions: . sodium chloride Stopped (09/23/17 0755)  . feeding supplement (VITAL AF 1.2 CAL)       Principal Problem:   Acute encephalopathy Active Problems:   Sepsis ([place])   Acute ischemic colitis ([place])   Acute respiratory failure ([place])   Tracheostomy care ([place])   Tracheostomy in place ([place])   MSSA (methicillin susceptible Staphylococcus aureus) pneumonia ([facility])   Severe protein-calorie malnutrition ([place])   Alcohol abuse with intoxication ([place])   Alcohol withdrawal syndrome with complication, with unspecified complication ([place])   Macrocytic anemia   Aortic atherosclerosis ([place])   LOS: 21 days    Time spent chart review, coordination of care 45 minutes.

## 2017-09-25 DIAGNOSIS — Z430 Encounter for attention to tracheostomy: Secondary | ICD-10-CM

## 2017-09-25 DIAGNOSIS — R0902 Hypoxemia: Secondary | ICD-10-CM

## 2017-09-25 DIAGNOSIS — Z930 Tracheostomy status: Secondary | ICD-10-CM

## 2017-09-25 LAB — BASIC METABOLIC PANEL
Anion gap: 11 (ref 5–15)
BUN: 26 mg/dL — ABNORMAL HIGH (ref 6–20)
CO2: 26 mmol/L (ref 22–32)
Calcium: 9.5 mg/dL (ref 8.9–10.3)
Chloride: 102 mmol/L (ref 98–111)
Creatinine, Ser: 0.6 mg/dL (ref 0.44–1.00)
GFR calc Af Amer: >60 mL/min (ref >60)
GFR calc non Af Amer: >60 mL/min (ref >60)
Glucose, Bld: 177 mg/dL — ABNORMAL HIGH (ref 70–99)
Potassium: 3.5 mmol/L (ref 3.5–5.1)
Sodium: 139 mmol/L (ref 135–145)

## 2017-09-25 LAB — GLUCOSE, CAPILLARY
Glucose-Capillary: 119 mg/dL — ABNORMAL HIGH (ref 70–99)
Glucose-Capillary: 119 mg/dL — ABNORMAL HIGH (ref 70–99)
Glucose-Capillary: 129 mg/dL — ABNORMAL HIGH (ref 70–99)
Glucose-Capillary: 134 mg/dL — ABNORMAL HIGH (ref 70–99)

## 2017-09-25 LAB — CBC
HCT: 31.1 % — ABNORMAL LOW (ref 36.0–46.0)
Hemoglobin: 9.8 g/dL — ABNORMAL LOW (ref 12.0–15.0)
MCH: 30.6 pg (ref 26.0–34.0)
MCHC: 31.5 g/dL (ref 30.0–36.0)
MCV: 97.2 fL (ref 78.0–100.0)
Platelets: 498 10*3/uL — ABNORMAL HIGH (ref 150–400)
RBC: 3.2 MIL/uL — ABNORMAL LOW (ref 3.87–5.11)
RDW: 14.4 % (ref 11.5–15.5)
WBC: 14.1 10*3/uL — ABNORMAL HIGH (ref 4.0–10.5)

## 2017-09-25 LAB — TSH: TSH: 0.146 u[IU]/mL — ABNORMAL LOW (ref 0.350–4.500)

## 2017-09-25 LAB — T4, FREE: Free T4: 1.13 ng/dL (ref 0.82–1.77)

## 2017-09-25 MED ORDER — LAMOTRIGINE 25 MG PO TABS: 25.0000 mg | ORAL_TABLET | Freq: Two times a day (BID) | ORAL | Status: DC

## 2017-09-25 MED ORDER — FOLIC ACID 1 MG PO TABS: 1.0000 mg | ORAL_TABLET | Freq: Every day | ORAL | Status: DC

## 2017-09-25 MED ORDER — THIAMINE HCL 100 MG PO TABS: 100.0000 mg | ORAL_TABLET | Freq: Every day | ORAL | Status: DC

## 2017-09-25 MED ORDER — AMLODIPINE BESYLATE 10 MG PO TABS: 10.0000 mg | ORAL_TABLET | Freq: Every day | ORAL | Status: DC

## 2017-09-25 MED ORDER — ROSUVASTATIN CALCIUM 10 MG PO TABS: 10.0000 mg | ORAL_TABLET | Freq: Every day | ORAL | Status: DC

## 2017-09-25 MED ORDER — FAMOTIDINE 40 MG/5ML PO SUSR: 20.0000 mg | Freq: Two times a day (BID) | ORAL | Status: DC

## 2017-09-25 MED ORDER — GABAPENTIN 250 MG/5ML PO SOLN: 100.0000 mg | Freq: Every day | ORAL | 0 refills | Status: DC

## 2017-09-25 MED ORDER — QUETIAPINE FUMARATE 100 MG PO TABS: 100.0000 mg | ORAL_TABLET | Freq: Two times a day (BID) | ORAL | Status: DC

## 2017-09-25 MED ORDER — VITAL AF 1.2 CAL PO LIQD: 1500.0000 mL | ORAL | Status: DC

## 2017-09-25 MED ORDER — VALACYCLOVIR HCL 500 MG PO TABS: 500.0000 mg | ORAL_TABLET | Freq: Two times a day (BID) | ORAL | Status: DC

## 2017-09-25 MED ORDER — ARIPIPRAZOLE 5 MG PO TABS: 5.0000 mg | ORAL_TABLET | Freq: Every day | ORAL | Status: DC

## 2017-09-25 MED ORDER — ADULT MULTIVITAMIN LIQUID CH: 15.0000 mL | Freq: Every day | ORAL | Status: DC

## 2017-09-25 MED ORDER — BUPROPION HCL 100 MG PO TABS: 50.0000 mg | ORAL_TABLET | Freq: Two times a day (BID) | ORAL | Status: DC

## 2017-09-25 MED ORDER — METOPROLOL TARTRATE 25 MG/10 ML ORAL SUSPENSION: 25.0000 mg | Freq: Two times a day (BID) | ORAL | Status: DC

## 2017-09-25 MED ORDER — DILTIAZEM 12 MG/ML ORAL SUSPENSION: 60.0000 mg | Freq: Four times a day (QID) | ORAL | Status: DC

## 2017-09-25 NOTE — Progress Notes (Signed)
  Speech Language Pathology Treatment: Hillary BowPassy Muir Speaking valve  Patient Details Name: Carla Bowen MRN: [id number] DOB: Apr 06, 1955 Today's Date: 09/25/2017 Time: [id number] SLP Time Calculation (min) (ACUTE ONLY): 14 min  Assessment / Plan / Recommendation Clinical Impression  Pt is more alert today and making more attempts to verbalize with PMV in place. PMV was donned intermittent for 1-2 minute intervals before WOB and RR would increase. Less back pressure was noted upon removal today than during initial evaluation. Pt needed Mod-Max cues to initiate phonation as opposed to either nodding her head or mouthing words. When she did speak her voice was hoarse and her speech was mostly unintelligible. She did clearly ask more than once to have her mitts removed. Recommend continued use of PMV with SLP only - pt may benefit from smaller and/or cuffless trach to facilitate use.   HPI HPI: Pt is a 62 y.o. female admitted 09/03/17 with AMS, abdominal pain and diarrhea; worked up for sepsis secondary to colitis vs. pneumonia. Pt with VDRF in setting of MSSA PNA, ARDS, and septic shock. ETT 6/24-7/4; trach placed 7/4. CT 7/3 negative for acute process. EEG 7/8 shows non-specific cerebral dysfunction. PMH includes anxiety, depression, fibromyalgia, HTN, CVA (L residual weakness).      SLP Plan  Continue with current plan of care       Recommendations         Patient may use Passy-Muir Speech Valve: with SLP only PMSV Supervision: Full MD: Please consider changing trach tube to : Cuffless;Smaller size         Oral Care Recommendations: Oral care QID Follow up Recommendations: LTACH SLP Visit Diagnosis: Aphonia (R49.1) Plan: Continue with current plan of care       GO                Carla Bowen, Kevontay Burks 09/25/2017, 11:50 AM  Carla HamLaura Bowen, M.A. CCC-SLP [phone]

## 2017-09-25 NOTE — Care Management Note (Addendum)
Case Management Note  Patient Details  Name: Carla Bowen MRN: [id number] Date of Birth: Jul 18, 1955  Subjective/Objective:   NCM spoke with Irving BurtonEmily , the [facility] rep and she states she has Geographical information systems officerauth approval for LTAC for this patient today.    NCM spoke with patient's sister, Aldean BakerCathy Jones and told her that patient will go to [facility] today, we have [place].                [facility]  Expected Discharge Date:  09/12/17               Expected Discharge Plan:  Long Term Acute Care (LTAC)  In-House Referral:  Clinical Social Work  Discharge planning Services  CM Consult  Post Acute Care Choice:  Long Term Acute Care (LTAC) Choice offered to:     DME Arranged:    DME Agency:     HH Arranged:    HH Agency:     Status of Service:  Completed, signed off  If discussed at MicrosoftLong Length of [employer] CompanyStay Meetings, dates discussed:    Additional Comments:  Leone Havenaylor, Delray Reza Clinton, RN 09/25/2017, 10:29 AM

## 2017-09-25 NOTE — Discharge Summary (Signed)
Physician Discharge Summary  Carla Bowen ZOX:[id number] DOB: 1955-09-02 DOA: 09/03/2017  PCP: Dois Davenport, MD  Admit date: 09/03/2017 Discharge date: 09/25/2017  Recommendations for Outpatient Follow-up:   Continue tracheostomy care per pulmonology.  Continue trach collar.  Speech therapy intervention.  Low TSH. --Significance unclear.  Free T4 within normal limits.  Follow-up as an outpatient.  Depression, anxiety --Continue Abilify, Wellbutrin, Lamictal.  Resume Cymbalta when able to take p.o.  Severe protein calorie malnutrition, dysphagia. --Continue tube feeds.  Continue speech therapy intervention.       Discharge Diagnoses:  1. Acute hypoxic respiratory failure secondary to MSSA pneumonia, aspiration pneumonia, ARDS, septic shock requiring vasopressors 2. Acute encephalopathy, delirium 3. Alcohol abuse, possible dependence; with severe alcohol withdrawal 4. Low TSH 5. Depression, anxiety 6. Severe protein calorie malnutrition 7. Macrocytic anemia stable 8. Transient atrial fibrillation witmh rapid ventricular response.   9. Ischemic colitis 10. AKI  Discharge Condition: improved Disposition: [facility]   Diet recommendation: NPO. Continue ST. Has Cortrak tube. Tube feeds per dietician.  Filed Weights   09/23/17 0300 09/24/17 0400 09/25/17 0409  Weight: 49.8 kg (109 lb 12.6 oz) 50.8 kg (111 lb 15.9 oz) 50.8 kg (111 lb 15.9 oz)    History of present illness:  62 year old woman PMH alcohol abuse, presented with abdominal pain, diarrhea, fever.  Admitted for pneumonia, infectious or inflammatory colitis, sepsis, acute kidney injury.    Hospital Course:  Remained hypotensive despite fluids and was transferred to PCCM, required intubation for respiratory failure and vasopressors for shock. Seen by general surgery and gastroenterology for abnormal colonic findings on CT, thought to be ischemic colitis, treated with conservative management and both  these disciplines signed off.  Found to have MSSA pneumonia, aspiration pneumonia, shock, ARDS.  Hospitalization was complicated by inability to wean and profound agitation, unresponsive to Precedex, eventually requiring tracheostomy.  At this point she is stable on tracheostomy but still has encephalopathy.  She is making slow progress with speech therapy in regard to PMV.  Point to transfer to [facility] today. Updated sister Aldean Baker by telephone on transfer.  Acute hypoxic respiratory failure secondary to MSSA pneumonia, aspiration pneumonia, ARDS, septic shock requiring vasopressors.  Failed multiple weaning attempts secondary to agitation.  Status post tracheostomy 7/4.  Completed antibiotics. --Continue tracheostomy care per pulmonology.  Continue trach collar.  Acute encephalopathy, delirium.  Very slow to improve.  EEG nonspecific.  CT head negative for acute disease x2.  MRI brain thus far not obtainable secondary to agitation.  Unresponsive to Precedex. --Follows simple commands, mouths "thank you".  Appears to be slowly improving.  Alcohol abuse, possible dependence; with severe alcohol withdrawal. Ethanol level 247 on admission.  Did not tolerate Precedex. --Continue folate, thiamine, multivitamin  TSH 0.082 on admission; was also low 0.131 12/24/2015, although free T4 was normal at that time. --Significance unclear.  Free T4 within normal limits.  Follow-up as an outpatient.  Depression, anxiety --Continue Abilify, Wellbutrin, Lamictal.  Resume Cymbalta when able to take p.o.  Severe protein calorie malnutrition --Continue tube feeds  Macrocytic anemia stable.  Presumably related to alcohol abuse. B-12 and folate adequate --Remains stable.  Transient atrial fibrillation witmh rapid ventricular response.   --Continue metoprolol, diltiazem.  Echocardiogram normal LVEF.  No anticoagulation at this point given transient nature.  Ischemic colitis --Resolved with supportive  care.  AKI --Resolved with fluids.  Consultants:  PCCM  General surgery  GI  Procedures:  ETT 6/24 >> 6/28 (self-extubation, immediately re-intubated) >>  PICC 7/1  7/4 Video Bronchoscopy for trach  7/4 Percutaneous Tracheostomy Placement  7/9 EEG Clinical Interpretation: ThisEEG is consistent with ageneralized non-specific cerebral dysfunction(encephalopathy). There was no seizure or seizure predisposition recorded on this study. Please note that a normal EEG does not preclude the possibility of epilepsy.   Cortak tube 7/10  Echo Study Conclusions  - Left ventricle: The cavity size was normal. Systolic function was normal. The estimated ejection fraction was in the range of 55% to 60%. Although no diagnostic regional wall motion abnormality was identified, this possibility cannot be completely excluded on the basis of this study. Left ventricular diastolic function parameters were normal.  Antimicrobials:  None now  Today's assessment: S: Awake, follows simple commands. O: Vitals:  Vitals:   09/25/17 0736 09/25/17 1150  BP: (!) 141/70 122/62  Pulse: (!) 107 97  Resp: (!) 22 (!) 23  Temp: 99.2 F (37.3 C) 98.7 F (37.1 C)  SpO2: 96% 97%    Constitutional:  . Appears more calm today, still restless at times. Respiratory:  . CTA bilaterally, no w/r/r.  . Respiratory effort normal.  Cardiovascular:  . RRR, no m/r/g . No LE extremity edema   Abdomen:  . Soft, nontender, nondistended.  Some ecchymosis noted over the abdomen consistent with heparin injections. Musculoskeletal:  . RUE, LUE, RLE, LLE   . Moves all extremities to command Psychiatric:  . Mental status o More cooperative today, alert, mouths "thank you".  BG stable.  BMP unremarkable.  Hemoglobin stable 9.8.  WBC without change, 14.1.  TSH depressed 0.146 but free T4 1.13 within normal limits.  Independently reviewed shows sinus rhythm, LVH, no change in modest prolonged  QT of 492 ms  Discharge Instructions  Discharge Instructions    Diet - low sodium heart healthy   Complete by:  As directed    Increase activity slowly   Complete by:  As directed      Allergies as of 09/25/2017      Reactions   Ciprofloxacin Itching, Nausea And Vomiting, Rash   Codeine Hives   Sulfa Antibiotics Hives   Tramadol Hives      Medication List    STOP taking these medications   aspirin EC 81 MG tablet   buPROPion 100 MG 12 hr tablet Commonly known as:  WELLBUTRIN SR Replaced by:  buPROPion 100 MG tablet   chlorthalidone 25 MG tablet Commonly known as:  HYGROTON   cyclobenzaprine 10 MG tablet Commonly known as:  FLEXERIL   DULoxetine 30 MG capsule Commonly known as:  CYMBALTA   fluconazole 100 MG tablet Commonly known as:  DIFLUCAN   fluticasone 50 MCG/ACT nasal spray Commonly known as:  FLONASE   gabapentin 100 MG capsule Commonly known as:  NEURONTIN Replaced by:  gabapentin 250 MG/5ML solution   HYDROcodone-acetaminophen 5-325 MG tablet Commonly known as:  NORCO/VICODIN   lansoprazole 30 MG capsule Commonly known as:  PREVACID   lidocaine 5 % ointment Commonly known as:  XYLOCAINE   lisinopril 20 MG tablet Commonly known as:  PRINIVIL,ZESTRIL   lisinopril 40 MG tablet Commonly known as:  PRINIVIL,ZESTRIL   montelukast 10 MG tablet Commonly known as:  SINGULAIR   omeprazole 40 MG capsule Commonly known as:  PRILOSEC   potassium chloride SA 20 MEQ tablet Commonly known as:  K-DUR,KLOR-CON     TAKE these medications   amLODipine 10 MG tablet Commonly known as:  NORVASC Place 1 tablet (10 mg total) into feeding tube daily. Start taking  on:  09/26/2017 What changed:  how to take this   ARIPiprazole 5 MG tablet Commonly known as:  ABILIFY Place 1 tablet (5 mg total) into feeding tube daily. Start taking on:  09/26/2017 What changed:  how to take this   buPROPion 100 MG tablet Commonly known as:  WELLBUTRIN Place 0.5 tablets  (50 mg total) into feeding tube 2 (two) times daily. Replaces:  buPROPion 100 MG 12 hr tablet   diltiazem 10 mg/ml oral suspension Commonly known as:  CARDIZEM Place 6 mLs (60 mg total) into feeding tube every 6 (six) hours.   famotidine 40 MG/5ML suspension Commonly known as:  PEPCID Place 2.5 mLs (20 mg total) into feeding tube every 12 (twelve) hours.   feeding supplement (VITAL AF 1.2 CAL) Liqd Place 1,500 mLs into feeding tube continuous.   folic acid 1 MG tablet Commonly known as:  FOLVITE Place 1 tablet (1 mg total) into feeding tube daily. Start taking on:  09/26/2017   gabapentin 250 MG/5ML solution Commonly known as:  NEURONTIN Place 2 mLs (100 mg total) into feeding tube at bedtime. Replaces:  gabapentin 100 MG capsule   lamoTRIgine 25 MG tablet Commonly known as:  LAMICTAL Place 1 tablet (25 mg total) into feeding tube 2 (two) times daily. What changed:  how to take this   metoprolol tartrate 25 mg/10 mL Susp Commonly known as:  LOPRESSOR Place 10 mLs (25 mg total) into feeding tube 2 (two) times daily.   multivitamin Liqd Place 15 mLs into feeding tube daily. Start taking on:  09/26/2017   QUEtiapine 100 MG tablet Commonly known as:  SEROQUEL Place 1 tablet (100 mg total) into feeding tube 2 (two) times daily.   rosuvastatin 10 MG tablet Commonly known as:  CRESTOR Place 1 tablet (10 mg total) into feeding tube at bedtime. What changed:  how to take this   thiamine 100 MG tablet Place 1 tablet (100 mg total) into feeding tube daily. Start taking on:  09/26/2017   valACYclovir 500 MG tablet Commonly known as:  VALTREX Place 1 tablet (500 mg total) into feeding tube 2 (two) times daily. What changed:  how to take this      Allergies  Allergen Reactions  . Ciprofloxacin Itching, Nausea And Vomiting and Rash  . Codeine Hives  . Sulfa Antibiotics Hives  . Tramadol Hives    The results of significant diagnostics from this hospitalization (including  imaging, microbiology, ancillary and laboratory) are listed below for reference.    Significant Diagnostic Studies: Ct Abdomen Pelvis Wo Contrast  Result Date: 09/03/2017 CLINICAL DATA:  Generalized abdominal pain, diarrhea. EXAM: CT ABDOMEN AND PELVIS WITHOUT CONTRAST TECHNIQUE: Multidetector CT imaging of the abdomen and pelvis was performed following the standard protocol without IV contrast. COMPARISON:  CT scan of February 20, 2017. FINDINGS: Lower chest: Mild bilateral posterior basilar subsegmental atelectasis is noted. Hepatobiliary: No focal liver abnormality is seen. No gallstones, gallbladder wall thickening, or biliary dilatation. Pancreas: Unremarkable. No pancreatic ductal dilatation or surrounding inflammatory changes. Spleen: Normal in size without focal abnormality. Adrenals/Urinary Tract: Adrenal glands are unremarkable. Bilateral nonobstructive nephrolithiasis is noted. No hydronephrosis or renal obstruction is noted. No ureteral calculi are noted. Urinary bladder is decompressed. Stomach/Bowel: The stomach appears normal. The appendix appears normal. Stool is noted throughout the colon. Wall thickening of descending and proximal sigmoid colon is noted consistent with infectious or inflammatory colitis. Vascular/Lymphatic: Aortic atherosclerosis. No enlarged abdominal or pelvic lymph nodes. Reproductive: Uterus and bilateral adnexa are  unremarkable. Other: Small amount of free fluid is noted in the pelvis. No hernia is noted. Musculoskeletal: No acute or significant osseous findings. IMPRESSION: Wall thickening of descending and proximal sigmoid colon is noted consistent with infectious or inflammatory colitis. Bilateral nonobstructive nephrolithiasis. Mild bilateral posterior basilar subsegmental atelectasis. Aortic Atherosclerosis (ICD10-I70.0). Electronically Signed   By: Lupita Raider, M.D.   On: 09/03/2017 18:25   Dg Abd 1 View  Result Date: 09/11/2017 CLINICAL DATA:  Follow-up  colitis.  Assess NG tube position. EXAM: ABDOMEN - 1 VIEW COMPARISON:  Abdominal radiograph of September 05, 2017 FINDINGS: There is a moderate amount of gas within the colon and a moderate amount of stool in the right colon. No free extraluminal gas collections are observed. The esophagogastric tube tip projects in the region of the gastric body. IMPRESSION: Moderate gaseous distention of the colon. Moderately increased right colonic stool burden without evidence of obstruction of the small or large bowel. Electronically Signed   By: David  [place] M.D.   On: 09/11/2017 12:56   Ct Head Wo Contrast  Result Date: 09/13/2017 CLINICAL DATA:  Hypotension.  New left-sided weakness. EXAM: CT HEAD WITHOUT CONTRAST TECHNIQUE: Contiguous axial images were obtained from the base of the skull through the vertex without intravenous contrast. COMPARISON:  CT head 09/04/2017 and 02/20/2017. FINDINGS: Brain: There is no evidence of acute intracranial hemorrhage, mass lesion, brain edema or extra-axial fluid collection. The ventricles and subarachnoid spaces are appropriately sized for age. There is no CT evidence of acute cortical infarction. There is a stable old infarct posteriorly in the right thalamus. Vascular:  No hyperdense vessel identified. Skull: Negative for fracture or focal lesion. Sinuses/Orbits: The visualized paranasal sinuses are clear. The patient is intubated. There is a left mastoid effusion with mild soft tissue thickening in the left middle ear. The mastoid air cells and middle ear on the right appear normal. There is no coalescence. Other: None. IMPRESSION: 1. No acute intracranial findings. Specifically, no CT evidence of acute stroke or hemorrhage. 2. Left mastoid effusion with soft tissue thickening in the left middle ear. Electronically Signed   By: Carey Bullocks M.D.   On: 09/13/2017 10:32   Ct Head Wo Contrast  Result Date: 09/04/2017 CLINICAL DATA:  62 year old female with altered mental status.  EXAM: CT HEAD WITHOUT CONTRAST TECHNIQUE: Contiguous axial images were obtained from the base of the skull through the vertex without intravenous contrast. COMPARISON:  Head CT dated 02/20/2017 FINDINGS: Brain: The ventricles and sulci appropriate size for patient's age. Small old right thalamic infarct again noted. There is no acute intracranial hemorrhage. No mass effect or midline shift. No extra-axial fluid collection. Vascular: No hyperdense vessel or unexpected calcification. Skull: Normal. Negative for fracture or focal lesion. Sinuses/Orbits: No acute finding. Other: None IMPRESSION: 1. No acute intracranial pathology. 2. 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 Electronically Signed   By: Elgie Collard M.D.   On: 09/04/2017 02:20   Dg Chest Port 1 View  Result Date: 09/18/2017 CLINICAL DATA:  Respiratory failure EXAM: PORTABLE CHEST 1 VIEW COMPARISON:  September 17, 2017 FINDINGS: Tracheostomy catheter tip is 6.2 cm above the carina. Nasogastric tube tip and side port are below the diaphragm. Central catheter tip is at the cavoatrial junction. No pneumothorax. There is persistent airspace consolidation in the left lower lobe with small left pleural effusion. There is also airspace consolidation in the left upper lobe medially. Right lung is clear. Heart is upper normal in size with pulmonary vascularity  normal. No evident adenopathy. No bone lesions. IMPRESSION: Tube and catheter positions as described without pneumothorax. Airspace consolidation medial left upper lobe and left base regions, stable. There is a left pleural effusion. Right lung is clear. Stable cardiac silhouette. Electronically Signed   By: Bretta Bang III M.D.   On: 09/18/2017 07:08   Dg Chest Port 1 View  Result Date: 09/17/2017 CLINICAL DATA:  Respiratory failure EXAM: PORTABLE CHEST 1 VIEW COMPARISON:  09/16/2017 chest radiograph. FINDINGS: Tracheostomy tube tip overlies the tracheal air column at the thoracic inlet. Right PICC  terminates over the right atrium. Enteric tube enters stomach with the tip not seen on this image. Stable cardiomediastinal silhouette with normal heart size. No pneumothorax. Stable small left pleural effusion. No right pleural effusion. Low lung volumes. No pulmonary edema. Stable left retrocardiac opacity and mild right lung base atelectasis. IMPRESSION: 1. Support structures as detailed. 2. Stable small left pleural effusion. 3. Stable left retrocardiac opacity, favor atelectasis. 4. Stable mild right lung base atelectasis. Electronically Signed   By: Delbert Phenix M.D.   On: 09/17/2017 07:02   Dg Chest Port 1 View  Result Date: 09/16/2017 CLINICAL DATA:  Acute respiratory failure EXAM: PORTABLE CHEST 1 VIEW COMPARISON:  September 15, 2017 FINDINGS: The tracheostomy tube and right PICC line are stable. The OG tube terminates below today's film. No pneumothorax. Left basilar opacity with a possible small associated effusion are stable. Stable cardiomediastinal silhouette spur. No other changes. IMPRESSION: 1. Stable support apparatus as above. 2. Stable left basilar opacity with a probable small associated effusion. Electronically Signed   By: Gerome Sam III M.D   On: 09/16/2017 07:02   Dg Chest Port 1 View  Result Date: 09/15/2017 CLINICAL DATA:  Prior intubation.  Tracheostomy tube. EXAM: PORTABLE CHEST 1 VIEW COMPARISON:  09/14/2017.  09/13/2017.  09/03/2017 05/24/2017. FINDINGS: Patient is rotated to the left. Tracheostomy tube, NG tube, right PICC line stable position. Heart size normal. Left upper lobe and left base atelectatic changes/infiltrate noted. Follow-up chest x-rays to demonstrate clearing and to exclude underlying mass lesion suggested. Small left pleural effusion. No pneumothorax. Left neck subcutaneous emphysema versus prominent skin fold. Continued evaluation of this region on subsequent follow-up exam suggested. No acute bony abnormality. IMPRESSION: 1.  Tracheostomy tube, NG tube, right  PICC line in stable position. 2. Left upper lobe and left base atelectatic changes/infiltrate. Continued follow-up exams to demonstrate clearing and to exclude underlying mass lesion suggested. Tiny left pleural effusion. 3. Left neck subcutaneous emphysema versus prominent skin fold. Attention to this area on subsequent follow-up exam suggested. Electronically Signed   By: Maisie Fus  Register   On: 09/15/2017 05:54   Dg Chest Port 1 View  Result Date: 09/14/2017 CLINICAL DATA:  Check tracheostomy tube EXAM: PORTABLE CHEST 1 VIEW COMPARISON:  09/14/17 FINDINGS: Endotracheal tube has been removed in the interval. New tracheostomy tube is noted in satisfactory position. Nasogastric catheter is within the stomach. Cardiac shadow is within normal limits. Persistent left retrocardiac density with small left pleural effusion is noted stable from the prior exam. No new focal infiltrate is seen. Right-sided PICC line is noted in satisfactory position. IMPRESSION: Tracheostomy tube in satisfactory position. Stable left basilar changes. Electronically Signed   By: Alcide Clever M.D.   On: 09/14/2017 12:40   Dg Chest Port 1 View  Result Date: 09/14/2017 CLINICAL DATA:  Acute respiratory failure. EXAM: PORTABLE CHEST 1 VIEW COMPARISON:  09/13/2017 FINDINGS: Overlapping tube in the upper chest. It appears that  the endotracheal tube tip is about 3 cm above the carina. Orogastric or nasogastric tube enters the stomach. Right arm PICC tip is in the SVC 2 cm above the right atrium. Persistent bibasilar atelectasis left worse than right with small left effusion. Pleural fluid may be increasing on the left. IMPRESSION: Lines and tubes well positioned. Persistent basilar atelectasis and left effusion. Left effusion may be enlarging. Electronically Signed   By: Paulina Fusi M.D.   On: 09/14/2017 07:01   Dg Chest Port 1 View  Result Date: 09/13/2017 CLINICAL DATA:  Acute respiratory failure. EXAM: PORTABLE CHEST 1 VIEW COMPARISON:   Radiograph of September 12, 2017. FINDINGS: Stable cardiomediastinal silhouette. Endotracheal and nasogastric tubes are unchanged in position. Distal tip of right-sided PICC line is seen in expected position of cavoatrial junction. No pneumothorax is noted. Stable bilateral perihilar and basilar opacities are noted. Small pleural effusions may be present. No pneumothorax is noted. Bony thorax is unremarkable. IMPRESSION: Stable support apparatus. Stable bibasilar opacities as described above. Electronically Signed   By: Lupita Raider, M.D.   On: 09/13/2017 07:29   Dg Chest Port 1 View  Result Date: 09/12/2017 CLINICAL DATA:  Acute respiratory failure. Shortness of breath. Ventilator support. EXAM: PORTABLE CHEST 1 VIEW COMPARISON:  09/11/2017 FINDINGS: Endotracheal tube tip is 3 cm above the carina. Orogastric tube enters the stomach. Right arm PICC tip is in the proximal to mid right atrium. Persistent volume loss the left upper lobe and left base and worsened volume loss in the right lower lung. Probable pleural effusions left larger than right. IMPRESSION: No pronounced change since yesterday. Right arm PICC tip in the right atrium. Persistent effusions and volume loss left worse than right. Some slight worsening of volume loss at right base since yesterday. Electronically Signed   By: Paulina Fusi M.D.   On: 09/12/2017 07:13   Dg Chest Port 1 View  Result Date: 09/11/2017 CLINICAL DATA:  Unsuccessful attempt to place left internal jugular or left subclavian venous catheter. Assess for pneumothorax. EXAM: PORTABLE CHEST 1 VIEW COMPARISON:  Portable chest x-ray of September 11, 2017 at 5:21 a.m. FINDINGS: There is increased density in the left pulmonary apex as compared to this morning study. There is a stable small left pleural effusion inferiorly. The right lung is mildly hypoinflated with elevation of the hemidiaphragm which is stable. Linear density at the right lung base likely reflects atelectasis. The heart is  normal in size. The pulmonary vascularity is not engorged. The left perihilar soft tissues are prominent but stable. The endotracheal tube tip projects approximately 3.6 cm above the carina. The esophagogastric tube tip and proximal port project in the gastric body. IMPRESSION: No postprocedure pneumothorax. One cannot exclude a small amount of postprocedure pleural space hemorrhage but shifting of pre-existing pleural fluid may also be responsible for increased density in the left apex. Reasonable positioning of the endotracheal and esophagogastric tubes. Chronically elevated right hemidiaphragm. Minimal subsegmental atelectasis at the right lung base. Electronically Signed   By: David  [place] M.D.   On: 09/11/2017 14:27   Dg Chest Portable 1 View  Result Date: 09/11/2017 CLINICAL DATA:  Respiratory failure EXAM: PORTABLE CHEST 1 VIEW COMPARISON:  Yesterday FINDINGS: Endotracheal tube tip between the clavicular heads and carina. An orogastric tube reaches the stomach. There is a low volume chest with hazy bilateral airspace opacity. Pleural effusion on the left. No Kerley lines. No pneumothorax. IMPRESSION: 1. Stable hardware positioning. 2. Bilateral airspace disease that is likely pneumonia.  Small appearing left pleural effusion which could be loculated laterally. Electronically Signed   By: Marnee Spring M.D.   On: 09/11/2017 07:02   Dg Chest Portable 1 View  Result Date: 09/10/2017 CLINICAL DATA:  Respiratory failure. EXAM: PORTABLE CHEST 1 VIEW COMPARISON:  09/09/2017 FINDINGS: Endotracheal tube and enteric catheter in stable position. Cardiomediastinal silhouette is normal. Mediastinal contours appear intact. No evidence of pneumothorax. Persistent bilateral pleural effusions slight improvement in left upper lobe airspace consolidation. Persistent bibasilar peribronchial airspace consolidation versus atelectasis. Interstitial pulmonary edema. Osseous structures are without acute abnormality. Soft  tissues are grossly normal. IMPRESSION: Stable support apparatus. Slight improvement in left upper lobe airspace consolidation. Mild interstitial pulmonary edema. Probably bilateral pleural effusions. Electronically Signed   By: Ted Mcalpine M.D.   On: 09/10/2017 07:10   Dg Chest Port 1 View  Result Date: 09/09/2017 CLINICAL DATA:  Hypoxia. EXAM: PORTABLE CHEST 1 VIEW COMPARISON:  09/08/2017. FINDINGS: The endotracheal tube remains in satisfactory position. Nasogastric tube extending into the stomach. Normal sized heart. Increased patchy opacity in the left upper lobe. No significant change in patchy and linear density at the left lung base. Stable mild linear density at the right lung base. No acute bony abnormality. IMPRESSION: 1. Increased left upper lobe pneumonia. 2. Stable left basilar atelectasis or pneumonia. 3. Stable bibasilar linear atelectasis. Electronically Signed   By: Beckie Salts M.D.   On: 09/09/2017 07:39   Dg Chest Port 1 View  Result Date: 09/08/2017 CLINICAL DATA:  Encounter for intubation EXAM: PORTABLE CHEST 1 VIEW COMPARISON:  09/08/2017 FINDINGS: Endotracheal tube in good position and unchanged. NG tube in the stomach. Left upper lobe infiltrate unchanged. Progression of left lower lobe infiltrate. Mild right lower lobe atelectasis unchanged. Minimal left effusion. Heart size and vascularity normal. IMPRESSION: Left upper lobe and left lower lobe infiltrate, probable pneumonia. Mild progression in the left lung base. Endotracheal tube in good position and unchanged. Electronically Signed   By: Marlan Palau M.D.   On: 09/08/2017 09:32   Dg Chest Port 1 View  Result Date: 09/08/2017 CLINICAL DATA:  Hypoxia. EXAM: PORTABLE CHEST 1 VIEW COMPARISON:  Radiograph of September 07, 2017. FINDINGS: The heart size and mediastinal contours are within normal limits. No pneumothorax or pleural effusion is noted. Endotracheal and nasogastric tubes are in grossly good position. Left upper  lobe airspace opacity is noted concerning for possible pneumonia. Mild bibasilar subsegmental atelectasis is noted. The visualized skeletal structures are unremarkable. IMPRESSION: Stable support apparatus. Left upper lobe airspace opacity concerning for pneumonia. Mild bibasilar subsegmental atelectasis. Electronically Signed   By: Lupita Raider, M.D.   On: 09/08/2017 07:20   Dg Chest Port 1 View  Result Date: 09/07/2017 CLINICAL DATA:  Follow-up pneumonia EXAM: PORTABLE CHEST 1 VIEW COMPARISON:  09/05/2017 FINDINGS: Cardiac shadow is stable. Endotracheal tube and nasogastric catheter are within normal limits. Persistent bilateral infiltrative changes are noted right greater than left similar to that seen on the prior exam. No new focal abnormality is noted. IMPRESSION: No significant change from the prior study. Electronically Signed   By: Alcide Clever M.D.   On: 09/07/2017 08:27   Dg Chest Port 1 View  Result Date: 09/05/2017 CLINICAL DATA:  Follow-up pneumonia EXAM: PORTABLE CHEST 1 VIEW COMPARISON:  09/04/2017 FINDINGS: Endotracheal tube and nasogastric catheter are again seen in satisfactory position. The lungs are again hypoinflated. Some improved aeration is noted in the left lung although increasing patchy infiltrates are noted within the right lung. Portion  of this may be related increasing pulmonary edema. No sizable effusion is noted at this time. No bony abnormality is seen. IMPRESSION: Improved aeration on the left with increased infiltrative changes on the right. Electronically Signed   By: Alcide CleverMark  Lukens M.D.   On: 09/05/2017 07:31   Dg Chest Port 1 View  Result Date: 09/04/2017 CLINICAL DATA:  ARDS.  Hypertension. EXAM: PORTABLE CHEST 1 VIEW COMPARISON:  Portable film earlier in the day. FINDINGS: Patient has been intubated. ETT tip 2.7 cm above carina. Low lung volumes. Normal heart size. Worsening aeration with increasing both patchy and confluent consistent with the diagnosis of ARDS.  Small RIGHT effusion. Nasogastric tube in the stomach. IMPRESSION: Worsening aeration.  Satisfactory ETT placement. Electronically Signed   By: Elsie StainJohn T Curnes M.D.   On: 09/04/2017 19:41   Dg Chest Port 1 View  Result Date: 09/04/2017 CLINICAL DATA:  Sepsis. EXAM: PORTABLE CHEST 1 VIEW COMPARISON:  Chest x-ray from yesterday. FINDINGS: The heart size and mediastinal contours are within normal limits. Normal pulmonary vascularity. Low lung volumes with new patchy consolidation in the left upper and lower lobes, as well as the right mid and infrahilar lung. No pleural effusion or pneumothorax. No acute osseous abnormality. IMPRESSION: 1. New left-greater-than-right airspace disease, concerning for multifocal pneumonia or ARDS. Electronically Signed   By: Obie DredgeWilliam T Derry M.D.   On: 09/04/2017 11:22   Dg Chest Portable 1 View  Result Date: 09/03/2017 CLINICAL DATA:  Fever. EXAM: PORTABLE CHEST 1 VIEW COMPARISON:  Radiographs of May 24, 2017. FINDINGS: The heart size and mediastinal contours are within normal limits. No pneumothorax or pleural effusion is noted. Right lung is clear. Minimal left basilar atelectasis or infiltrate is noted. The visualized skeletal structures are unremarkable. IMPRESSION: Minimal left basilar atelectasis or infiltrate is noted. Electronically Signed   By: Lupita RaiderJames  Green Jr, M.D.   On: 09/03/2017 14:11   Dg Abd Portable 1v  Result Date: 09/14/2017 CLINICAL DATA:  Check gastric catheter placement EXAM: PORTABLE ABDOMEN - 1 VIEW COMPARISON:  09/13/2017 FINDINGS: Gastric catheter is again noted in the distal stomach stable from the previous day. Scattered large and small bowel gas is noted. No bony abnormality is seen. IMPRESSION: Gastric catheter within the distal stomach Electronically Signed   By: Alcide CleverMark  Lukens M.D.   On: 09/14/2017 12:40   Dg Abd Portable 1v  Result Date: 09/13/2017 CLINICAL DATA:  Orogastric tube placement EXAM: PORTABLE ABDOMEN - 1 VIEW COMPARISON:  Portable  exam 1706 hours compared to 09/11/2017 FINDINGS: Tip of orogastric tube projects over distal gastric antrum. Scattered gas throughout nondistended proximal half of colon. Nonobstructive bowel gas pattern. Bones demineralized. No urinary tract calcification. Lung bases grossly clear. IMPRESSION: Tip of orogastric tube projects over distal gastric antrum. Electronically Signed   By: Ulyses SouthwardMark  Boles M.D.   On: 09/13/2017 17:18   Dg Abd Portable 1v  Result Date: 09/05/2017 CLINICAL DATA:  Abdominal pain EXAM: PORTABLE ABDOMEN - 1 VIEW COMPARISON:  09/04/2017 FINDINGS: Scattered large and small bowel gas is noted. The previously seen dilated loops of small bowel are not appreciated on this exam. No free air is seen. Nasogastric catheter is coiled within the stomach. No bony abnormality is noted. IMPRESSION: Resolution of small bowel dilatation. Nasogastric catheter within the stomach. Electronically Signed   By: Alcide CleverMark  Lukens M.D.   On: 09/05/2017 07:30   Dg Abd Portable 1v  Result Date: 09/04/2017 CLINICAL DATA:  Severe abdominal pain. Abdominal distention. Recent diagnosis of colitis. EXAM: PORTABLE  ABDOMEN - 1 VIEW COMPARISON:  CT abdomen and pelvis 09/03/2017. FINDINGS: Mildly dilated loops of small bowel are present throughout the abdomen. There is gas within the colon. Thickened colonic wall is again noted. Obstruction or free air is present. Right greater than left lower lobe airspace disease is present. IMPRESSION: 1. Mildly dilated loops of small bowel throughout the abdomen with gas in the colon. Findings are compatible with adynamic ileus. 2. Thickening of the colonic wall compatible with colitis. 3. Bibasilar airspace disease compatible with multi lobar pneumonia versus ARDS. Electronically Signed   By: Marin Roberts M.D.   On: 09/04/2017 13:10   Dg Abd Portable 1 View  Result Date: 09/03/2017 CLINICAL DATA:  Acute generalized abdominal pain, diarrhea. EXAM: PORTABLE ABDOMEN - 1 VIEW COMPARISON:   Radiograph of Jul 25, 2017. FINDINGS: The bowel gas pattern is normal. No radio-opaque calculi or other significant radiographic abnormality are seen. IMPRESSION: No evidence of bowel obstruction or ileus. Electronically Signed   By: Lupita Raider, M.D.   On: 09/03/2017 14:09   [place] Ekg Site Rite  Result Date: 09/11/2017 If Site Rite image not attached, placement could not be confirmed due to current cardiac rhythm.   Microbiology: Recent Results (from the past 240 hour(s))  Culture, blood (routine x 2)     Status: None   Collection Time: 09/17/17 12:17 AM  Result Value Ref Range Status   Specimen Description BLOOD LEFT HAND  Final   Special Requests   Final    BOTTLES DRAWN AEROBIC AND ANAEROBIC Blood Culture adequate volume   Culture   Final    NO GROWTH 5 DAYS Performed at [facility], [address] [place], [place] [address]    Report Status 09/22/2017 FINAL  Final  Culture, blood (routine x 2)     Status: None   Collection Time: 09/17/17 12:22 AM  Result Value Ref Range Status   Specimen Description BLOOD LEFT HAND  Final   Special Requests   Final    BOTTLES DRAWN AEROBIC ONLY Blood Culture adequate volume   Culture   Final    NO GROWTH 5 DAYS Performed at [facility], [address] [place], [place] [address]    Report Status 09/22/2017 FINAL  Final  Culture, respiratory (NON-Expectorated)     Status: None   Collection Time: 09/22/17 10:15 AM  Result Value Ref Range Status   Specimen Description TRACHEAL ASPIRATE  Final   Special Requests NONE  Final   Gram Stain   Final    MODERATE WBC PRESENT, PREDOMINANTLY PMN FEW GRAM POSITIVE COCCI FEW GRAM POSITIVE RODS RARE YEAST WITH PSEUDOHYPHAE    Culture   Final    Consistent with normal respiratory flora. Performed at [facility], [address], [place], [place] [address]    Report Status 09/24/2017 FINAL  Final     Labs: Basic Metabolic Panel: Recent Labs  Lab 09/19/17 0346  09/20/17 0413 09/21/17 0312 09/22/17 0300 09/25/17 0500  NA 142 142 139 141 139  K 3.7 3.9 3.1* 4.0 3.5  CL 105 102 104 102 102  CO2 28 29 27 27 26   GLUCOSE 131* 110* 112* 104* 177*  BUN 12 19 17  23* 26*  CREATININE 0.48 0.52 0.45 0.59 0.60  CALCIUM 8.7* 8.8* 8.4* 9.0 9.5  MG  --   --   --  2.0  --   PHOS  --   --   --  5.1*  --  Liver Function Tests: Recent Labs  Lab 09/20/17 0413 09/21/17 0312  AST 30 21  ALT 42 32  ALKPHOS 79 74  BILITOT 0.4 0.5  PROT 6.2* 5.9*  ALBUMIN 2.2* 2.2*    Recent Labs  Lab 09/20/17 0413  AMMONIA 20   CBC: Recent Labs  Lab 09/19/17 0346 09/20/17 0413 09/21/17 0312 09/22/17 0300 09/25/17 0500  WBC 12.7* 13.6* 13.6* 14.1* 14.1*  HGB 8.2* 8.2* 8.4* 8.8* 9.8*  HCT 26.9* 27.2* 27.8* 28.8* 31.1*  MCV 101.9* 101.5* 101.5* 101.1* 97.2  PLT 485* 453* 446* 499* 498*    CBG: Recent Labs  Lab 09/24/17 2004 09/25/17 0028 09/25/17 0407 09/25/17 0733 09/25/17 1147  GLUCAP 143* 119* 134* 129* 119*    Principal Problem:   Acute encephalopathy Active Problems:   Sepsis (HCC)   Acute ischemic colitis (HCC)   Acute respiratory failure (HCC)   Tracheostomy care (HCC)   Tracheostomy in place (HCC)   MSSA (methicillin susceptible Staphylococcus aureus) pneumonia (HCC)   Severe protein-calorie malnutrition (HCC)   Alcohol abuse with intoxication (HCC)   Alcohol withdrawal syndrome with complication, with unspecified complication (HCC)   Macrocytic anemia   Aortic atherosclerosis (HCC)   Time coordinating discharge: 35 minutes  Signed:  Brendia Sacks, MD [facility] 09/25/2017, 12:58 PM

## 2017-09-25 NOTE — Progress Notes (Signed)
PCCM Progress Note  62 year old woman with severe anxiety, depression fibromyalgia admitted 6/23 for MSSA pneumonia and ARDS and septic shock, required tracheostomy for prolonged mechanical ventilation on 7/4  She has tolerated trach collar for 24 hours. Main issue continues to be severe delirium requiring restraints and intermittent sedation with fentanyl, Versed and Haldol Secretions are minimal but was unable to tolerate Passy-Muir valve.  PE: Chronically ill appearing female, on TC, NAD HEENT: [place]/AT, PERRL, EOM-I and MMM Neuro: Alert and interactive, moving all ext to command Heart: RRR, Nl S1/S2 and -M/R/G Lung: coarse BS diffusely Abd: soft, NT, ND and +BS Ext: -edema and -tenderness Skin: intact  Impression/plan  62 year old female with severe anxiety and depression that was intubated for MSSA pneumonia and ARDS.  Subsequently trached.  Now on TC and well tolerated.  I reviewed CXR myself, trach is in good position.  Discussed with PCCM-NP.  Acute respiratory failure: liberated for the vent  - Monitor for airway protection  Hypoxemia:  - Titrate O2 for sat of 88-92%  Trach status:  - [facility] to progress patient, if not decannulated at the time of discharge from [facility] then call trach clinic for f/u.  Number [phone].  PCCM will sign off, please call back if needed.  Alyson ReedyWesam G. Markia Kyer, M.D. [facility]. Pager: [phone]. After hours pager: [phone].  09/25/2017

## 2017-09-25 NOTE — Progress Notes (Signed)
Pt seen by MD, orders written for d/c to [facility].   EMTALA for and medical necessity for filled out.  Called report to [place] PoliIsatu Sesay, RN at [facility].  Called [facility] for transport, notified pt sister [name] of pt transfer time.  Pt being discharged with TL PICC on RUA, Coretrack to right nare.  In bilateral wrist restraints.  Pt discharge via [facility] stretcher with all belongings.

## 2017-10-02 DIAGNOSIS — J9620 Acute and chronic respiratory failure, unspecified whether with hypoxia or hypercapnia: Secondary | ICD-10-CM

## 2017-10-02 DIAGNOSIS — J449 Chronic obstructive pulmonary disease, unspecified: Secondary | ICD-10-CM

## 2017-10-02 DIAGNOSIS — J698 Pneumonitis due to inhalation of other solids and liquids: Secondary | ICD-10-CM

## 2017-10-02 DIAGNOSIS — F1010 Alcohol abuse, uncomplicated: Secondary | ICD-10-CM

## 2017-12-26 ENCOUNTER — Other Ambulatory Visit: Payer: Self-pay | Admitting: Family Medicine

## 2017-12-26 DIAGNOSIS — E0590 Thyrotoxicosis, unspecified without thyrotoxic crisis or storm: Secondary | ICD-10-CM

## 2018-04-24 ENCOUNTER — Other Ambulatory Visit: Payer: Self-pay | Admitting: Family Medicine

## 2018-04-24 DIAGNOSIS — R630 Anorexia: Secondary | ICD-10-CM

## 2018-04-24 DIAGNOSIS — R634 Abnormal weight loss: Secondary | ICD-10-CM

## 2018-05-01 ENCOUNTER — Ambulatory Visit
Admission: RE | Admit: 2018-05-01 | Discharge: 2018-05-01 | Disposition: A | Discharge status: Home / Self Care | Payer: Medicare Other | Source: Ambulatory Visit | Attending: Family Medicine | Admitting: Family Medicine

## 2018-05-01 DIAGNOSIS — R630 Anorexia: Secondary | ICD-10-CM

## 2018-05-01 DIAGNOSIS — R634 Abnormal weight loss: Secondary | ICD-10-CM

## 2018-05-01 MED ORDER — IOPAMIDOL (ISOVUE-300) INJECTION 61%
100.0000 mL | Freq: Once | INTRAVENOUS | Status: AC | PRN
  Administered 2018-05-01: 100 mL via INTRAVENOUS

## 2018-08-21 ENCOUNTER — Encounter: Payer: Self-pay | Admitting: Neurology

## 2018-08-22 ENCOUNTER — Other Ambulatory Visit: Payer: Self-pay | Admitting: Family Medicine

## 2018-08-22 DIAGNOSIS — E038 Other specified hypothyroidism: Secondary | ICD-10-CM

## 2018-08-29 IMAGING — DX DG CHEST 2V
2 series · 2 of 2 positions shown · non-contrast
Comparison: 02/15/2016

CLINICAL DATA: Chest pain for 6 days

EXAM:
CHEST - 2 VIEW

[chest pa]
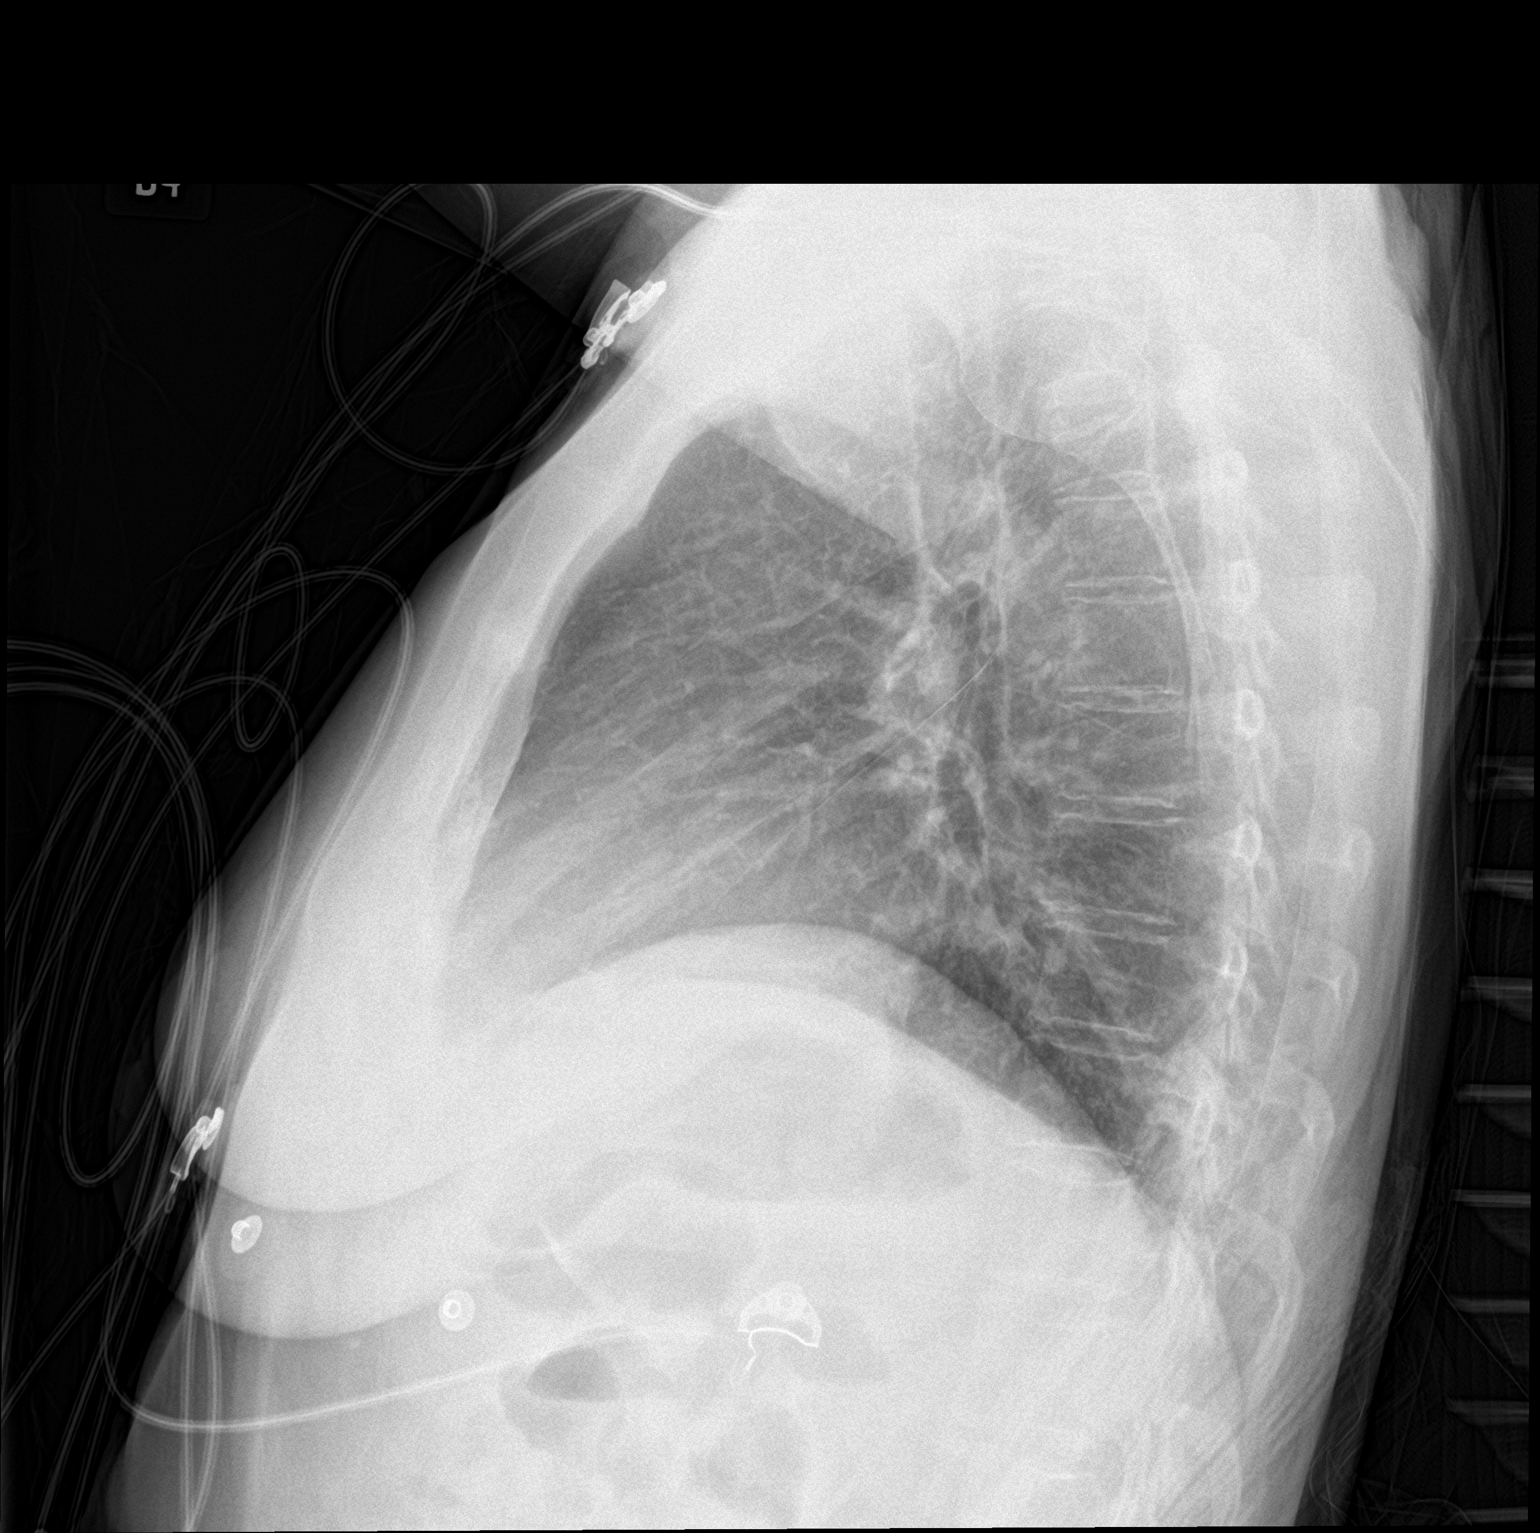

[chest ap]
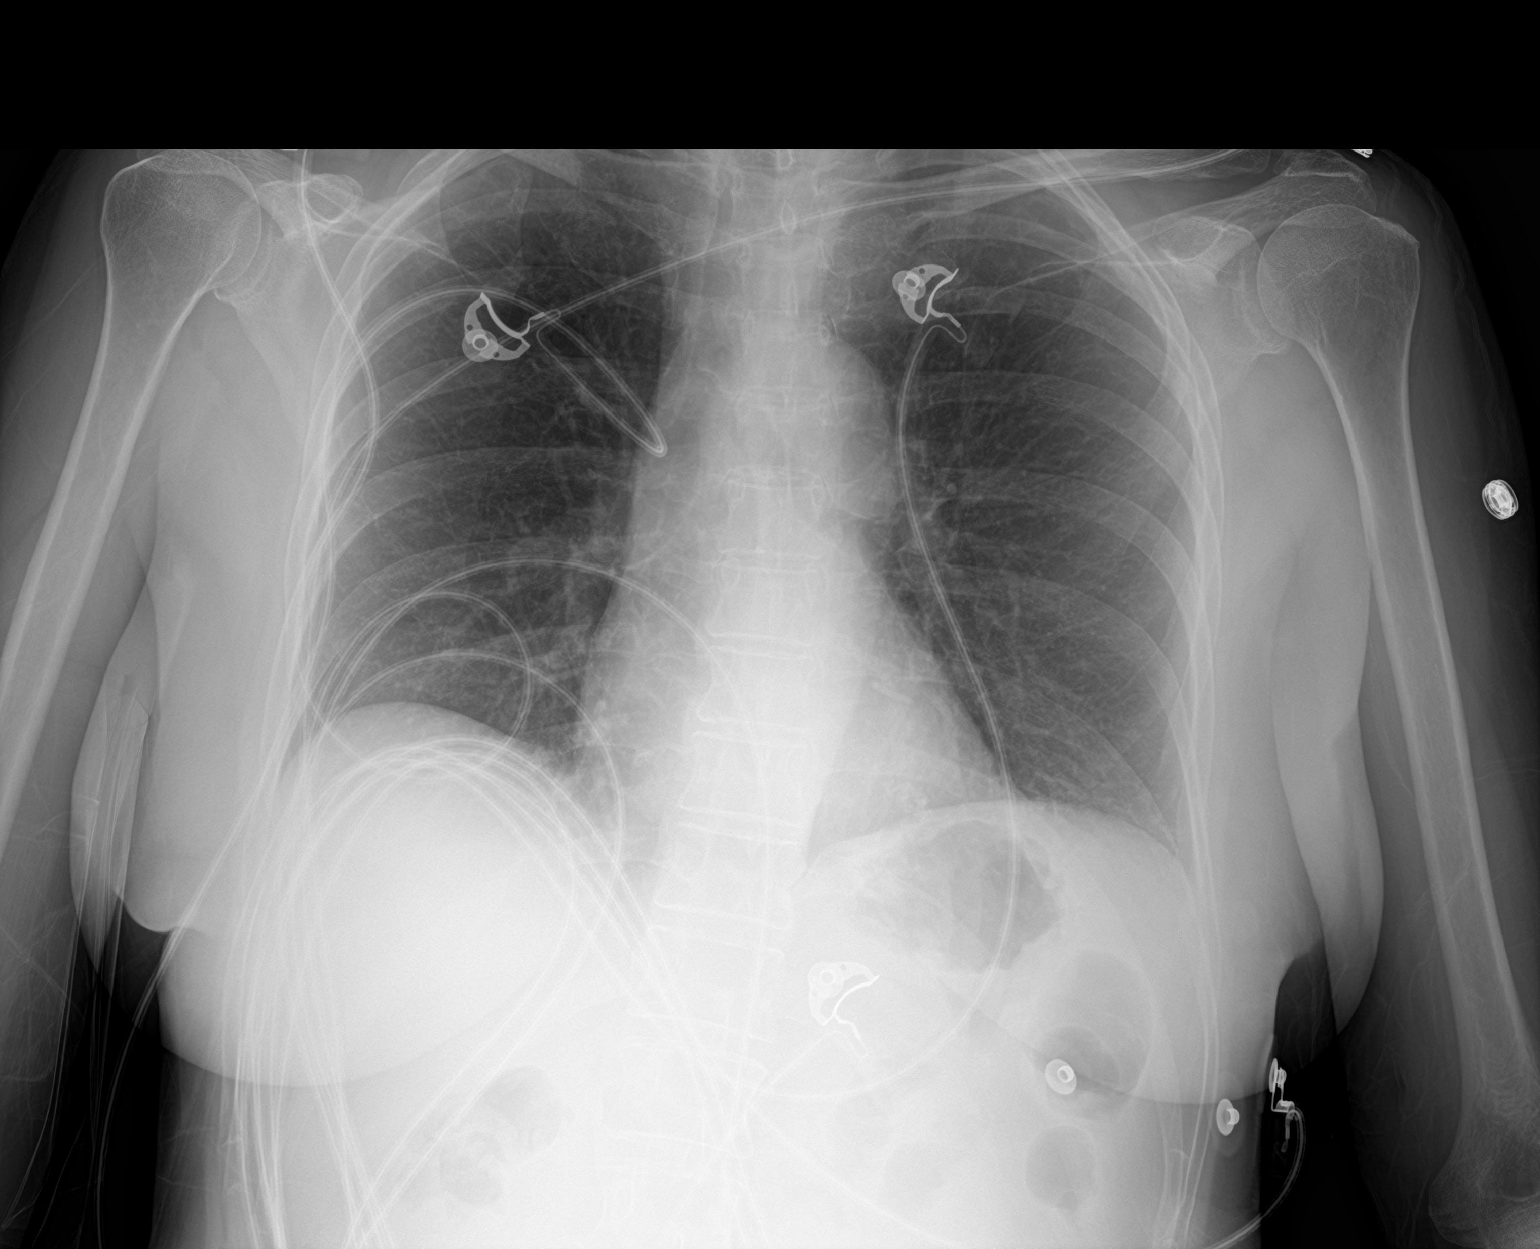

[2 of 2 positions shown; findings below may reference images not displayed]

FINDINGS: The heart size and mediastinal contours are within normal limits.
Both lungs are clear. The visualized skeletal structures are
unremarkable.
IMPRESSION: No active cardiopulmonary disease.

## 2018-09-18 ENCOUNTER — Ambulatory Visit
Admission: RE | Admit: 2018-09-18 | Discharge: 2018-09-18 | Disposition: A | Discharge status: Home / Self Care | Payer: Medicare Other | Source: Ambulatory Visit | Attending: Family Medicine | Admitting: Family Medicine

## 2018-09-18 ENCOUNTER — Encounter: Payer: Self-pay | Admitting: Physical Medicine & Rehabilitation

## 2018-09-18 DIAGNOSIS — E038 Other specified hypothyroidism: Secondary | ICD-10-CM

## 2018-09-26 ENCOUNTER — Ambulatory Visit: Payer: Medicare Other | Admitting: Neurology

## 2018-10-12 ENCOUNTER — Encounter: Payer: Medicare Other | Admitting: Physical Medicine & Rehabilitation

## 2018-10-23 ENCOUNTER — Ambulatory Visit: Payer: Medicare Other | Admitting: Neurology

## 2018-11-20 NOTE — Progress Notes (Deleted)
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  History supplemented by prior neurologist notes.  She was hospitalized in [place], [place] on 12/28/12 for a right thalamic hemorrhage with intraventicular extension.  She has residual dense left sided upper and lower extremity hemiparesis, spasticity and pain.    She takes *** For left sided pain and spasticity, ***  Due to endorsing increased left sided pain, she had MRI of brain without contrast on 11/03/14 which was personally reviewed and demonstrated chronic right thalamic hemorrhage but no acute intracranial abnormality.  CT cervical spine from 08/03/16 showed mild degenerative changes.  MRI of lumbar spine from 04/29/17 showed mild degenerative changes with bilateral neural foraminal stenosis at L5-S1 due to endplate spurring and small disc bulge as well as moderate facet arthrosis at L3-4 and L4-5 but no associated stenosis.  Echocardiogram from 09/04/17 showed EF 55-60%  PAST MEDICAL HISTORY: Past Medical History:  Diagnosis Date  . Anxiety   . Aortic atherosclerosis (HCC) 09/24/2017  . Depression   . Fibromyalgia   . High cholesterol   . Hypertension   . Stroke [facility])     PAST SURGICAL HISTORY: Past Surgical History:  Procedure Laterality Date  . CESAREAN SECTION      MEDICATIONS: Current Outpatient Medications on File Prior to Visit  Medication Sig Dispense Refill  . amLODipine (NORVASC) 10 MG tablet Place 1 tablet (10 mg total) into feeding tube daily.    . ARIPiprazole (ABILIFY) 5 MG tablet Place 1 tablet (5 mg total) into feeding tube daily.    Marland Kitchen buPROPion (WELLBUTRIN) 100  MG tablet Place 0.5 tablets (50 mg total) into feeding tube 2 (two) times daily.    Marland Kitchen diltiazem (CARDIZEM) 10 mg/ml oral suspension Place 6 mLs (60 mg total) into feeding tube every 6 (six) hours.    . famotidine (PEPCID) 40 MG/5ML suspension Place 2.5 mLs (20 mg total) into feeding tube every 12 (twelve) hours.    . folic acid (FOLVITE) 1 MG tablet Place 1 tablet (1 mg total) into feeding tube daily.    Marland Kitchen gabapentin (NEURONTIN) 250 MG/5ML solution Place 2 mLs (100 mg total) into feeding tube at bedtime.  0  . lamoTRIgine (LAMICTAL) 25 MG tablet Place 1 tablet (25 mg total) into feeding tube 2 (two) times daily.    . metoprolol tartrate (LOPRESSOR) 25 mg/10 mL SUSP Place 10 mLs (25 mg total) into feeding tube 2 (two) times daily.    . Multiple Vitamin (MULTIVITAMIN) LIQD Place 15 mLs into feeding tube daily.    . Nutritional Supplements (FEEDING SUPPLEMENT, VITAL AF 1.2 CAL,) LIQD Place 1,500 mLs into feeding tube continuous.    Marland Kitchen QUEtiapine (SEROQUEL) 100 MG tablet Place 1 tablet (100 mg total) into feeding tube 2 (two) times daily.    . rosuvastatin (CRESTOR) 10 MG tablet Place 1 tablet (10 mg total) into feeding tube at bedtime.    . thiamine 100 MG tablet Place 1 tablet (100 mg total) into feeding tube daily.    . valACYclovir (VALTREX) 500 MG tablet Place 1 tablet (500 mg total) into feeding tube 2 (two) times daily.     No current facility-administered medications on file prior  to visit.     ALLERGIES: Allergies  Allergen Reactions  . Ciprofloxacin Itching, Nausea And Vomiting and Rash  . Codeine Hives  . Sulfa Antibiotics Hives  . Tramadol Hives    FAMILY HISTORY: Family History  Problem Relation Age of Onset  . Lung cancer Father   . Diabetes Brother   . Heart attack Brother   . Hypertension Brother   . Hypertension Sister    ***.  SOCIAL HISTORY: Social History   Socioeconomic History  . Marital status: Divorced    Spouse name: Not on file  . Number of children: 1   . Years of education: Not on file  . Highest education level: Not on file  Occupational History  . Occupation: Disablity   Social Needs  . Financial resource strain: Not on file  . Food insecurity    Worry: Not on file    Inability: Not on file  . Transportation needs    Medical: Not on file    Non-medical: Not on file  Tobacco Use  . Smoking status: Current Every Day Smoker    Packs/day: 0.50    Years: 20.00    Pack years: 10.00  . Smokeless tobacco: Never Used  Substance and Sexual Activity  . Alcohol use: Yes    Alcohol/week: 10.0 standard drinks    Types: 5 Cans of beer, 5 Shots of liquor per week    Comment: BAC was 247  . Drug use: No  . Sexual activity: Not on file  Lifestyle  . Physical activity    Days per week: Not on file    Minutes per session: Not on file  . Stress: Not on file  Relationships  . Social Musicianconnections    Talks on phone: Not on file    Gets together: Not on file    Attends religious service: Not on file    Active member of club or organization: Not on file    Attends meetings of clubs or organizations: Not on file    Relationship status: Not on file  . Intimate partner violence    Fear of current or ex partner: Not on file    Emotionally abused: Not on file    Physically abused: Not on file    Forced sexual activity: Not on file  Other Topics Concern  . Not on file  Social History Narrative   Patient is right handed and lives at home with family.Patient drinks caffinated drinks.    REVIEW OF SYSTEMS: Constitutional: No fevers, chills, or sweats, no generalized fatigue, change in appetite Eyes: No visual changes, double vision, eye pain Ear, nose and throat: No hearing loss, ear pain, nasal congestion, sore throat Cardiovascular: No chest pain, palpitations Respiratory:  No shortness of breath at rest or with exertion, wheezes GastrointestinaI: No nausea, vomiting, diarrhea, abdominal pain, fecal incontinence Genitourinary:  No dysuria,  urinary retention or frequency Musculoskeletal:  No neck pain, back pain Integumentary: No rash, pruritus, skin lesions Neurological: as above Psychiatric: No depression, insomnia, anxiety Endocrine: No palpitations, fatigue, diaphoresis, mood swings, change in appetite, change in weight, increased thirst Hematologic/Lymphatic:  No purpura, petechiae. Allergic/Immunologic: no itchy/runny eyes, nasal congestion, recent allergic reactions, rashes  PHYSICAL EXAM: *** General: No acute distress.  Patient appears ***-groomed.  *** Head:  Normocephalic/atraumatic Eyes:  fundi examined but not visualized Neck: supple, no paraspinal tenderness, full range of motion Back: No paraspinal tenderness Heart: regular rate and rhythm Lungs: Clear to auscultation bilaterally. Vascular: No carotid bruits. Neurological  Exam: Mental status: alert and oriented to person, place, and time, recent and remote memory intact, fund of knowledge intact, attention and concentration intact, speech fluent and not dysarthric, language intact. Cranial nerves: CN I: not tested CN II: pupils equal, round and reactive to light, visual fields intact CN III, IV, VI:  full range of motion, no nystagmus, no ptosis CN V: facial sensation intact CN VII: upper and lower face symmetric CN VIII: hearing intact CN IX, X: gag intact, uvula midline CN XI: sternocleidomastoid and trapezius muscles intact CN XII: tongue midline Bulk & Tone: normal, no fasciculations. Motor:  5/5 throughout *** Sensation:  Pinprick *** temperature *** and vibration sensation intact.  ***. Deep Tendon Reflexes:  2+ throughout, *** toes downgoing.  *** Finger to nose testing:  Without dysmetria.  *** Heel to shin:  Without dysmetria.  *** Gait:  Normal station and stride.  Able to turn and tandem walk. Romberg ***.  IMPRESSION: ***  PLAN: ***  Thank you for allowing me to take part in the care of this patient.  Metta Clines, DO  CC: ***

## 2018-11-21 ENCOUNTER — Ambulatory Visit: Payer: Medicare Other | Admitting: Neurology

## 2018-12-05 NOTE — Progress Notes (Signed)
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  History supplemented by prior neurologist notes.  She was hospitalized in [place], [place] on 12/28/12 for a right thalamic hemorrhage with intraventicular extension.  She has residual dense left sided upper and lower extremity hemiparesis, spasticity and pain.    For left sided pain and spasticity, she takes gabapentin 600mg  at bedtime, Flexeril 10mg  PRN, lamotrigine Past management for pain:  Topiramate, tramadol, Botox, baclofen, Cymbalta, Lyrica (suicidal thoughts), physical therapy  Due to endorsing increased left sided pain, she had MRI of brain without contrast on 11/03/14 which was personally reviewed and demonstrated chronic right thalamic hemorrhage but no acute intracranial abnormality.  CT cervical spine from 08/03/16 showed mild degenerative changes.  MRI of lumbar spine from 04/29/17 showed mild degenerative changes with bilateral neural foraminal stenosis at L5-S1 due to endplate spurring and small disc bulge as well as moderate facet arthrosis at L3-4 and L4-5 but no associated stenosis.  Echocardiogram from 09/04/17 showed EF 55-60%   PAST MEDICAL HISTORY: Past Medical History:  Diagnosis Date  . Anxiety   . Aortic atherosclerosis (HCC) 09/24/2017  . Depression   . Fibromyalgia   . High cholesterol   . Hypertension   . Stroke [facility])     PAST SURGICAL HISTORY: Past Surgical History:  Procedure Laterality Date  . CESAREAN SECTION      MEDICATIONS: Outpatient Encounter Medications as of 12/07/2018  Medication Sig  . amLODipine (NORVASC) 10 MG tablet Place 1  tablet (10 mg total) into feeding tube daily.  . ARIPiprazole (ABILIFY) 5 MG tablet Place 1 tablet (5 mg total) into feeding tube daily.  . cyclobenzaprine (FLEXERIL) 10 MG tablet Take 10 mg by mouth 3 (three) times daily as needed.  . diltiazem (CARDIZEM) 10 mg/ml oral suspension Place 6 mLs (60 mg total) into feeding tube every 6 (six) hours.  . gabapentin (NEURONTIN) 250 MG/5ML solution Place 2 mLs (100 mg total) into feeding tube at bedtime.  Marland Kitchen ibuprofen (ADVIL) 800 MG tablet TAKE 1 (ONE) TABLET PER MOUTH THREE TIMES A DAY  . metoprolol tartrate (LOPRESSOR) 25 mg/10 mL SUSP Place 10 mLs (25 mg total) into feeding tube 2 (two) times daily.  Marland Kitchen omeprazole (PRILOSEC) 40 MG capsule TAKE 1 CAPSULE IN THE MORNING AND 1 CAPSULE AT NIGHT  . rosuvastatin (CRESTOR) 10 MG tablet Place 1 tablet (10 mg total) into feeding tube at bedtime.  Marland Kitchen buPROPion (WELLBUTRIN) 100 MG tablet Place 0.5 tablets (50 mg total) into feeding tube 2 (two) times daily. (Patient not taking: Reported on 12/07/2018)  . famotidine (PEPCID) 40 MG/5ML suspension Place 2.5 mLs (20 mg total) into feeding tube every 12 (twelve) hours. (Patient not taking: Reported on 12/07/2018)  . folic acid (FOLVITE) 1 MG tablet Place 1 tablet (1 mg total) into feeding tube daily. (Patient not taking: Reported on 12/07/2018)  . lamoTRIgine (LAMICTAL) 25 MG tablet Place 1 tablet (25 mg total) into feeding tube 2 (two) times daily. (Patient not taking: Reported on 12/07/2018)  . levocetirizine (XYZAL) 5 MG tablet   . Multiple Vitamin (MULTIVITAMIN) LIQD Place 15 mLs into feeding tube daily. (Patient not taking: Reported on 12/07/2018)  .  Nutritional Supplements (FEEDING SUPPLEMENT, VITAL AF 1.2 CAL,) LIQD Place 1,500 mLs into feeding tube continuous. (Patient not taking: Reported on 12/07/2018)  . QUEtiapine (SEROQUEL) 100 MG tablet Place 1 tablet (100 mg total) into feeding tube 2 (two) times daily. (Patient not taking: Reported on 12/07/2018)  . thiamine 100 MG  tablet Place 1 tablet (100 mg total) into feeding tube daily. (Patient not taking: Reported on 12/07/2018)  . valACYclovir (VALTREX) 500 MG tablet Place 1 tablet (500 mg total) into feeding tube 2 (two) times daily. (Patient not taking: Reported on 12/07/2018)   No facility-administered encounter medications on file as of 12/07/2018.     ALLERGIES: Allergies  Allergen Reactions  . Ciprofloxacin Itching, Nausea And Vomiting and Rash  . Codeine Hives  . Sulfa Antibiotics Hives  . Tramadol Hives    FAMILY HISTORY: Family History  Problem Relation Age of Onset  . Lung cancer Father   . Diabetes Brother   . Heart attack Brother   . Hypertension Brother   . Hypertension Sister    SOCIAL HISTORY: Social History   Socioeconomic History  . Marital status: Divorced    Spouse name: Not on file  . Number of children: 1  . Years of education: Not on file  . Highest education level: Not on file  Occupational History  . Occupation: Disablity   Social Needs  . Financial resource strain: Not on file  . Food insecurity    Worry: Not on file    Inability: Not on file  . Transportation needs    Medical: Not on file    Non-medical: Not on file  Tobacco Use  . Smoking status: Current Every Day Smoker    Packs/day: 0.50    Years: 20.00    Pack years: 10.00  . Smokeless tobacco: Never Used  Substance and Sexual Activity  . Alcohol use: Yes    Alcohol/week: 10.0 standard drinks    Types: 5 Cans of beer, 5 Shots of liquor per week    Comment: BAC was 247  . Drug use: No  . Sexual activity: Not on file  Lifestyle  . Physical activity    Days per week: Not on file    Minutes per session: Not on file  . Stress: Not on file  Relationships  . Social Herbalist on phone: Not on file    Gets together: Not on file    Attends religious service: Not on file    Active member of club or organization: Not on file    Attends meetings of clubs or organizations: Not on file     Relationship status: Not on file  . Intimate partner violence    Fear of current or ex partner: Not on file    Emotionally abused: Not on file    Physically abused: Not on file    Forced sexual activity: Not on file  Other Topics Concern  . Not on file  Social History Narrative   Patient is right handed and lives at home with family.Patient drinks caffinated drinks.    REVIEW OF SYSTEMS: Constitutional: No fevers, chills, or sweats, no generalized fatigue, change in appetite Eyes: No visual changes, double vision, eye pain Ear, nose and throat: No hearing loss, ear pain, nasal congestion, sore throat Cardiovascular: No chest pain, palpitations Respiratory:  No shortness of breath at rest or with exertion, wheezes GastrointestinaI: No nausea, vomiting, diarrhea, abdominal pain, fecal incontinence Genitourinary:  No dysuria, urinary retention or frequency  Musculoskeletal: as above Integumentary: No rash, pruritus, skin lesions Neurological: as above Psychiatric: No depression, insomnia, anxiety Endocrine: No palpitations, fatigue, diaphoresis, mood swings, change in appetite, change in weight, increased thirst Hematologic/Lymphatic:  No purpura, petechiae. Allergic/Immunologic: no itchy/runny eyes, nasal congestion, recent allergic reactions, rashes  PHYSICAL EXAM: Blood pressure (!) 134/94, pulse 86, temperature 98.5 F (36.9 C), resp. rate 12, height 5\' 2"  (1.575 m), weight 130 lb (59 kg), SpO2 95 %. General: No acute distress.  Patient appears well-groomed.   Head:  Normocephalic/atraumatic Eyes:  fundi examined but not visualized Neck: supple, no paraspinal tenderness, full range of motion Back: No paraspinal tenderness Heart: regular rate and rhythm Lungs: Clear to auscultation bilaterally. Vascular: No carotid bruits. Neurological Exam: Mental status: alert and oriented to person, place, and time, recent and remote memory intact, fund of knowledge intact, attention and  concentration intact, speech fluent and not dysarthric, language intact. Cranial nerves: CN I: not tested CN II: pupils equal, round and reactive to light, visual fields intact CN III, IV, VI:  full range of motion, no nystagmus, no ptosis CN V: reduced left V1-V3 CN VII: upper and lower face symmetric CN VIII: hearing intact CN IX, X: gag intact, uvula midline CN XI: sternocleidomastoid and trapezius muscles intact CN XII: tongue midline Bulk & Tone: left upper and lower extremity spasticity. Motor:  5-/5 left upper extremity, otherwise 5/5 Sensation:  Pinprick sensation reduced in left upper and lower extremities; vibration sensation intact.  Deep Tendon Reflexes:  3+ left upper and lower extremities, 2+ right upper and lower extremities, toes downgoing.   Finger to nose testing:  Left sided ataxia, normal on right Heel to shin:  Left sided ataxia; normal on right Gait:  Left hemiparetic gait.  Romberg with sway  IMPRESSION: 1.  History of right thalamic hypertensive intercerebral hemorrhage 2.  Thalamic pain syndrome 3.  Hypertension   Ms. 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  She failed multiple treatments and modalities, including neuroleptic medications, Botox and physical therapy.  She presented to me hoping that I may offer her cortisone shots.  Unfortunately, I do not perform this procedure.  It is my opinion that she will most benefit from seeing a pain specialist.  I am sorry that I could not be of help.  , DO  CC: Shon Millet, MD

## 2018-12-07 ENCOUNTER — Other Ambulatory Visit: Payer: Self-pay

## 2018-12-07 ENCOUNTER — Encounter: Payer: Self-pay | Admitting: Neurology

## 2018-12-07 ENCOUNTER — Ambulatory Visit (INDEPENDENT_AMBULATORY_CARE_PROVIDER_SITE_OTHER): Payer: Medicare Other | Admitting: Neurology

## 2018-12-07 VITALS — BP 134/94 | HR 86 | Temp 98.5°F | Resp 12 | Ht 62.0 in | Wt 130.0 lb

## 2018-12-07 DIAGNOSIS — G890 Central pain syndrome: Secondary | ICD-10-CM

## 2018-12-07 DIAGNOSIS — G8110 Spastic hemiplegia affecting unspecified side: Secondary | ICD-10-CM

## 2018-12-07 DIAGNOSIS — I619 Nontraumatic intracerebral hemorrhage, unspecified: Secondary | ICD-10-CM

## 2018-12-07 NOTE — Patient Instructions (Signed)
I am sorry that I am not able to help you.  Perhaps you may be better to see a pain specialist.

## 2018-12-11 IMAGING — DX DG ABD PORTABLE 1V
1 series · 1 of 1 positions shown · non-contrast
Comparison: 09/04/2017

CLINICAL DATA: Abdominal pain

EXAM:
PORTABLE ABDOMEN - 1 VIEW

[abdomen kub]
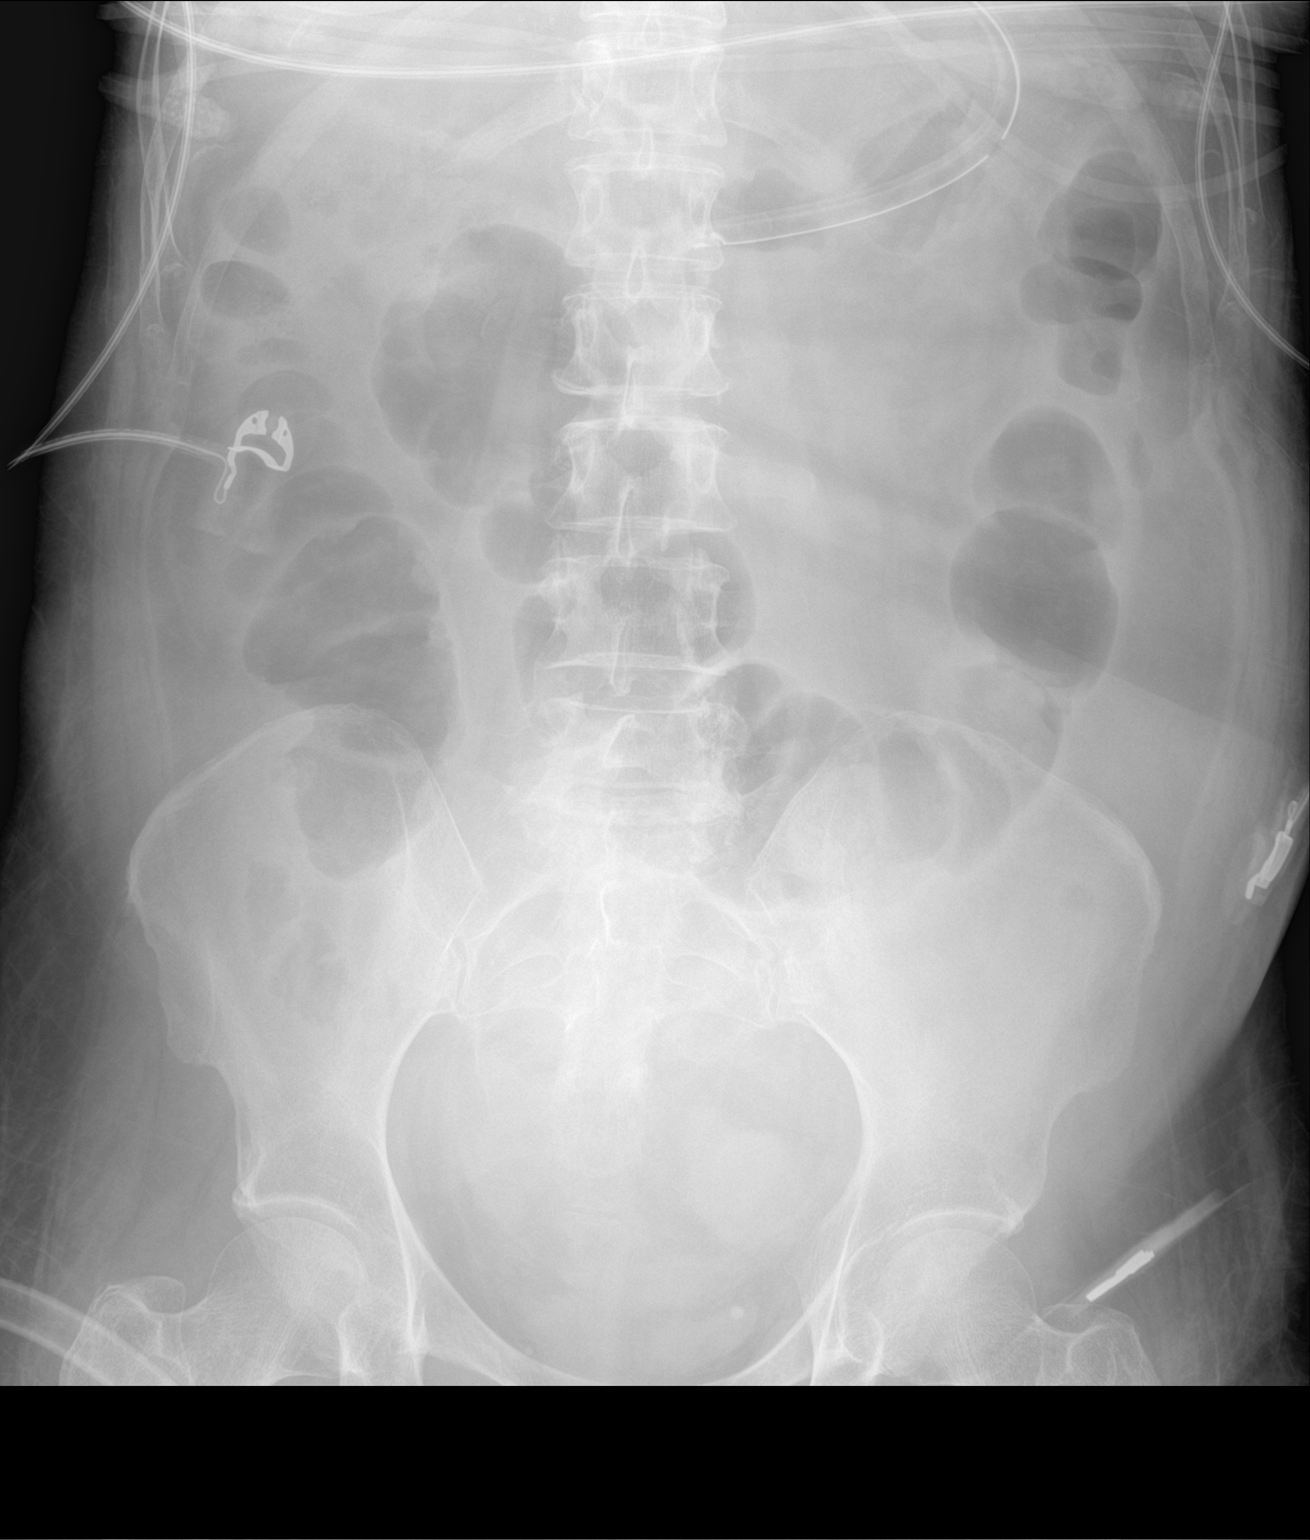

[1 of 1 positions shown; findings below may reference images not displayed]

FINDINGS: Scattered large and small bowel gas is noted. The previously seen
dilated loops of small bowel are not appreciated on this exam. No
free air is seen. Nasogastric catheter is coiled within the stomach.
No bony abnormality is noted.
IMPRESSION: Resolution of small bowel dilatation.

Nasogastric catheter within the stomach.

## 2018-12-14 IMAGING — DX DG CHEST 1V PORT
1 series · 1 of 1 positions shown · non-contrast
Comparison: Radiograph September 07, 2017.

CLINICAL DATA: Hypoxia.

EXAM:
PORTABLE CHEST 1 VIEW

[chest]
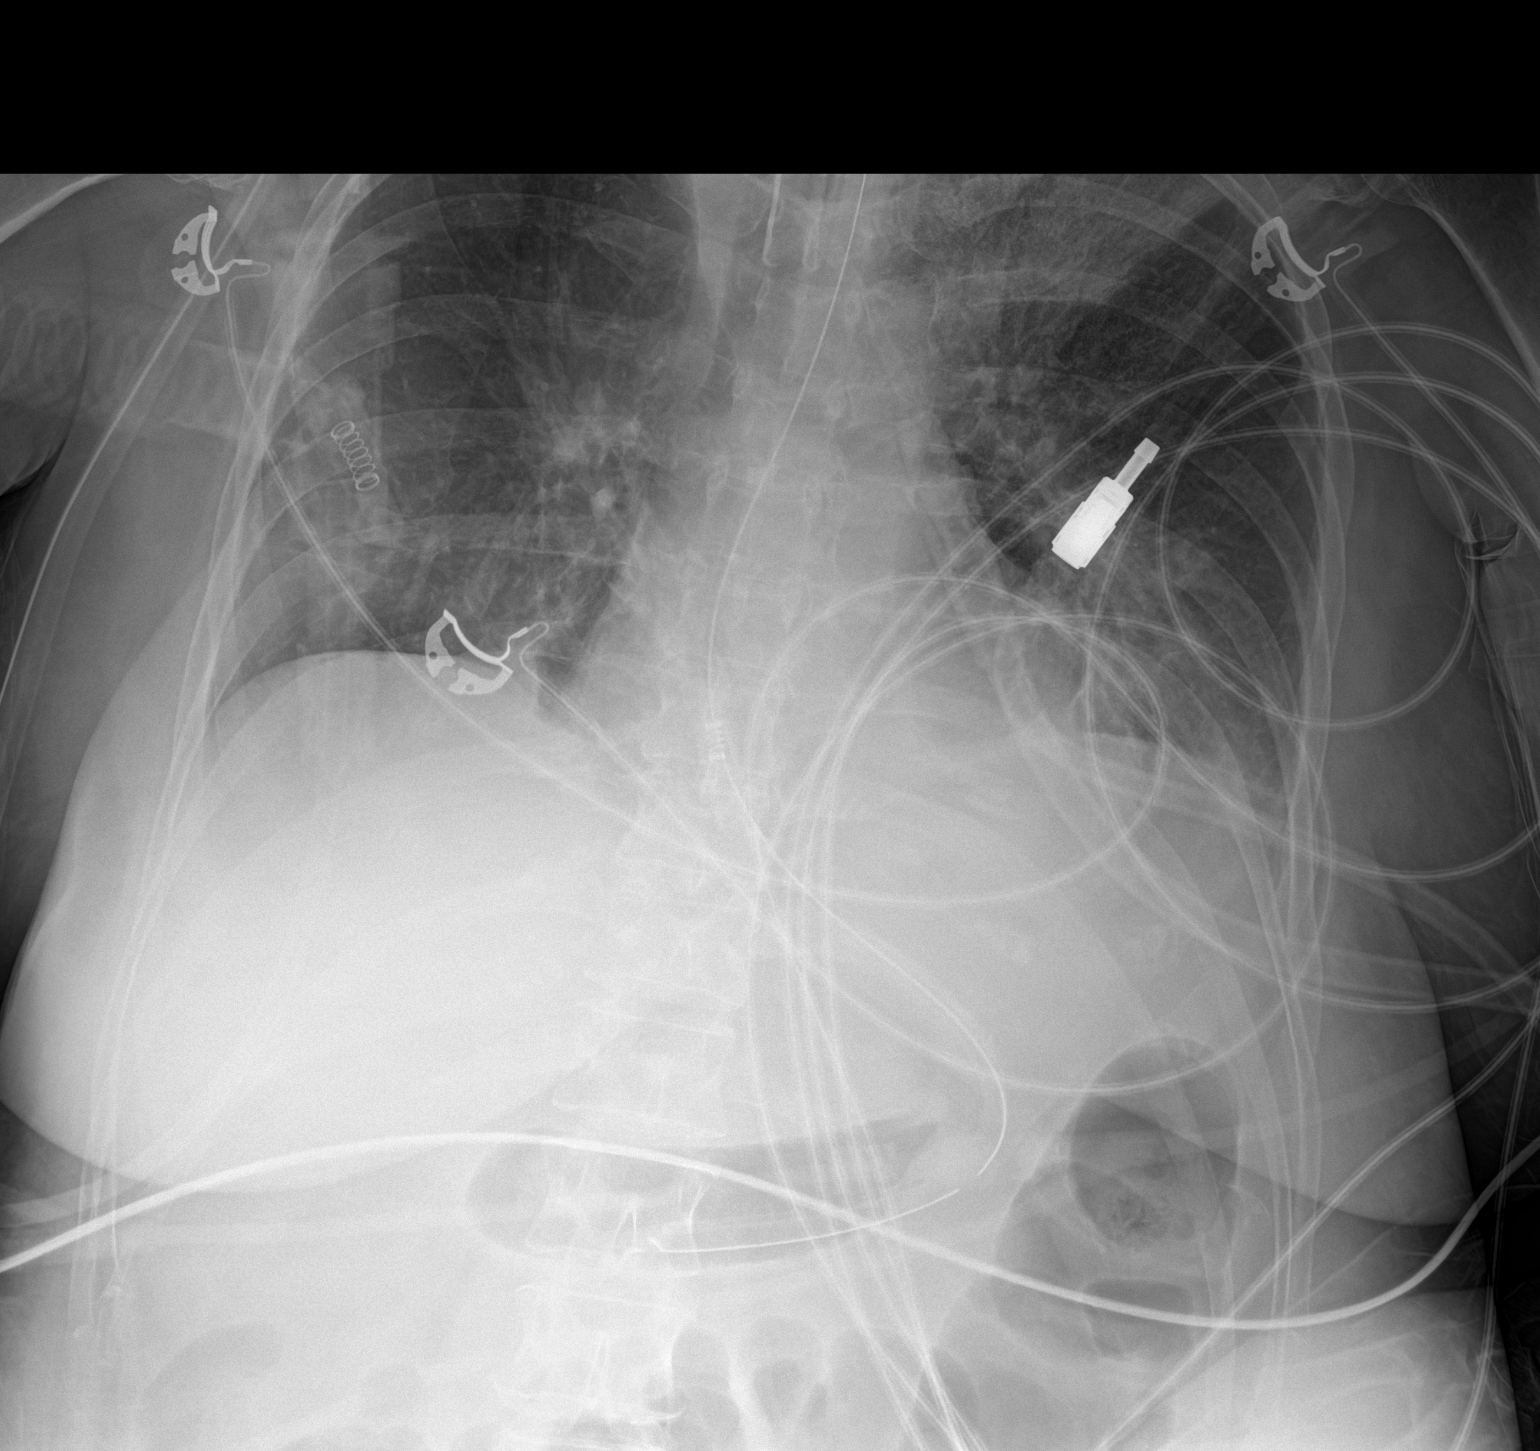

[1 of 1 positions shown; findings below may reference images not displayed]

FINDINGS: The heart size and mediastinal contours are within normal limits. No
pneumothorax or pleural effusion is noted. Endotracheal and
nasogastric tubes are in grossly good position. Left upper lobe
airspace opacity is noted concerning for possible pneumonia. Mild
bibasilar subsegmental atelectasis is noted. The visualized skeletal
structures are unremarkable.
IMPRESSION: Stable support apparatus. Left upper lobe airspace opacity
concerning for pneumonia. Mild bibasilar subsegmental atelectasis.

## 2019-03-27 DIAGNOSIS — G5602 Carpal tunnel syndrome, left upper limb: Secondary | ICD-10-CM | POA: Insufficient documentation

## 2019-04-04 ENCOUNTER — Other Ambulatory Visit: Payer: Self-pay | Admitting: Pain Medicine

## 2019-04-04 DIAGNOSIS — M79605 Pain in left leg: Secondary | ICD-10-CM

## 2019-04-04 DIAGNOSIS — M545 Low back pain, unspecified: Secondary | ICD-10-CM

## 2019-04-18 ENCOUNTER — Ambulatory Visit
Admission: RE | Admit: 2019-04-18 | Discharge: 2019-04-18 | Disposition: A | Discharge status: Home / Self Care | Payer: Medicare Other | Source: Ambulatory Visit | Attending: Pain Medicine | Admitting: Pain Medicine

## 2019-04-18 DIAGNOSIS — M545 Low back pain, unspecified: Secondary | ICD-10-CM

## 2019-04-18 DIAGNOSIS — M79605 Pain in left leg: Secondary | ICD-10-CM

## 2019-06-18 ENCOUNTER — Encounter: Payer: Self-pay | Admitting: Family Medicine

## 2019-08-01 ENCOUNTER — Ambulatory Visit: Payer: Medicare Other | Admitting: Allergy & Immunology

## 2019-08-02 ENCOUNTER — Ambulatory Visit: Payer: Medicare Other | Attending: Family Medicine | Admitting: Physical Therapy

## 2019-08-02 ENCOUNTER — Other Ambulatory Visit: Payer: Self-pay

## 2019-08-02 DIAGNOSIS — R2689 Other abnormalities of gait and mobility: Secondary | ICD-10-CM | POA: Diagnosis not present

## 2019-08-02 DIAGNOSIS — M6281 Muscle weakness (generalized): Secondary | ICD-10-CM

## 2019-08-02 DIAGNOSIS — R2681 Unsteadiness on feet: Secondary | ICD-10-CM

## 2019-08-02 DIAGNOSIS — R293 Abnormal posture: Secondary | ICD-10-CM

## 2019-08-02 NOTE — Therapy (Signed)
[facility] [address] [place], [place], [address] Phone: [phone]   Fax:  [phone]  Physical Therapy Evaluation  Patient Details  Name: Carla Bowen MRN: [id number] Date of Birth: 1955/09/17 Referring Provider (PT): Nadyne Coombes MD   Encounter Date: 08/02/2019  PT End of Session - 08/02/19 1806    Visit Number  1    Number of Visits  17    Date for PT Re-Evaluation  10/31/19   90 day cert for 8 week POC   Authorization Type  [employer] Medicare    Progress Note Due on Visit  10    PT Start Time  1317    PT Stop Time  1407    PT Time Calculation (min)  50 min    Equipment Utilized During Treatment  Gait belt    Activity Tolerance  Patient tolerated treatment well    Behavior During Therapy  [facility] for tasks assessed/performed       Past Medical History:  Diagnosis Date  . Anxiety   . Aortic atherosclerosis (HCC) 09/24/2017  . Depression   . Fibromyalgia   . High cholesterol   . Hypertension   . Stroke [facility])     Past Surgical History:  Procedure Laterality Date  . CESAREAN SECTION      There were no vitals filed for this visit.   Subjective Assessment - 08/02/19 1326    Subjective  Had a CVA on L side, 2014; have been to therapy several times, and it has helped.  As I get older, things have gotten worse.  I'm having multiple falls and my balance feels off when I stand up.  Something new that just started it that my L foot is that it is burning and my L foot trips me up sometimes.    Pertinent History  See problem list    Patient Stated Goals  I want to be able to walk normally and dance; to not be fearful of steps.    Currently in Pain?  Yes    Pain Score  8     Pain Location  Other (Comment)   whole left side   Pain Orientation  Left;Upper;Lower    Pain Descriptors / Indicators  Burning;Tingling;Numbness    Pain Onset  More than a month ago    Pain Frequency  Constant    Aggravating Factors    hurts all the time    Pain Relieving Factors  when I sleep         [facility] PT Assessment - 08/02/19 1332      Assessment   Medical Diagnosis  CVA with L hemiplegia    Referring Provider (PT)  Nadyne Coombes MD    Onset Date/Surgical Date  07/30/19   CVA 2014; MD visit earlier in May 2021   Hand Dominance  Right    Prior Therapy  Home health therapy and OPPT in the past (just after the CVA, at least 3 year ago)      Precautions   Precautions  Fall      Balance Screen   Has the patient fallen in the past 6 months  Yes    How many times?  at least 20; it's a high number   probably 1-2x/wk   Has the patient had a decrease in activity level because of a fear of falling?   Yes    Is the patient reluctant to leave their home because of a fear of falling?  No      Home Environment   Living Environment  Private residence    Living Arrangements  Alone    Available Help at Discharge  Family   Son will be living with her   Type of Home  Mobile home    Home Access  Stairs to enter    Entrance Stairs-Number of Steps  3    Entrance Stairs-Rails  Right    Home Layout  One level    Home Equipment  Cane - quad   Hemiwalker     Prior Function   Level of Independence  Independent    Vocation  On disability    Leisure  Enjoys walking in her neighborhood, enjoys swimming and being in the water      Observation/Other Assessments   Focus on Therapeutic Outcomes (FOTO)   NA      Posture/Postural Control   Posture/Postural Control  Postural limitations    Postural Limitations  Forward head;Rounded Shoulders   spasticity noted in LUE    Posture Comments  Toe curling L toes with volitional movements LLE      Tone   Assessment Location  Left Lower Extremity;Left Upper Extremity      ROM / Strength   AROM / PROM / Strength  Strength      Strength   Overall Strength  Deficits    Strength Assessment Site  Hip;Knee;Ankle    Right/Left Hip  Right;Left    Right Hip Flexion  4+/5    Left  Hip Flexion  3/5    Right/Left Knee  Right;Left    Right Knee Flexion  4+/5    Right Knee Extension  4+/5    Left Knee Flexion  4+/5    Left Knee Extension  4+/5    Right/Left Ankle  Right;Left    Right Ankle Dorsiflexion  3+/5    Left Ankle Dorsiflexion  3+/5    Left Ankle Inversion  3/5    Left Ankle Eversion  3/5      Transfers   Transfers  Sit to Stand;Stand to Sit    Sit to Stand  5: Supervision;Without upper extremity assist;From chair/3-in-1    Five time sit to stand comments   13.72    Stand to Sit  5: Supervision;Without upper extremity assist;To chair/3-in-1      Ambulation/Gait   Ambulation/Gait  Yes    Ambulation/Gait Assistance  5: Supervision;4: Min guard    Ambulation/Gait Assistance Details  Extensor posturing "kicks in" with LLE with gait, with ataxic movements; pt also with increased tone LUE with gait    Ambulation Distance (Feet)  150 Feet    Assistive device  None    Gait Pattern  Step-through pattern;Decreased arm swing - left;Decreased step length - right;Decreased stance time - left;Decreased hip/knee flexion - left;Decreased dorsiflexion - left;Decreased weight shift to left;Left circumduction;Left genu recurvatum;Ataxic;Decreased trunk rotation;Poor foot clearance - left    Ambulation Surface  Level;Indoor    Gait velocity  12.94 sec = 2.53 ft/sec      Standardized Balance Assessment   Standardized Balance Assessment  Timed Up and Go Test;Dynamic Gait Index      Dynamic Gait Index   Level Surface  Mild Impairment    Change in Gait Speed  Moderate Impairment    Gait with Horizontal Head Turns  Moderate Impairment    Gait with Vertical Head Turns  Moderate Impairment    Gait and Pivot Turn  Mild Impairment   3.47  Step Over Obstacle  Severe Impairment    Step Around Obstacles  Mild Impairment    Steps  Moderate Impairment    Total Score  10    DGI comment:  Scores <19/24 indicate increased fall risk      Timed Up and Go Test   Normal TUG (seconds)   15.84    TUG Comments  Scores >13.5 sec indicate increased fall risk.      High Level Balance   High Level Balance Comments  Unable to perform SLS on LLE or tandem stance      LUE Tone   LUE Tone  Moderate      LLE Tone   LLE Tone  Mild                  Objective measurements completed on examination: See above findings.              PT Education - 08/02/19 1806    Education Details  Results of eval, POC    Person(s) Educated  Patient    Methods  Explanation    Comprehension  Verbalized understanding       PT Short Term Goals - 08/02/19 1812      PT SHORT TERM GOAL #1   Title  Pt will be independent with HEP to address strength, balance, gait for improved functional mobility.  TARGET 08/30/2019    Time  4    Period  Weeks    Status  New      PT SHORT TERM GOAL #2   Title  Pt will improve 5x sit<>stand to less than or equal to 12 seconds to demonstrate improved functional lower extremity strength.    Time  4    Period  Weeks    Status  New      PT SHORT TERM GOAL #3   Title  Pt will improve DGI score to at least 15/24 for decreased fall risk.    Time  4    Period  Weeks    Status  New      PT SHORT TERM GOAL #4   Title  Pt will negotiate at least 4 steps with handrail with step through pattern, with supervision for improved entry/exit into home.    Time  4    Period  Weeks    Status  New      PT SHORT TERM GOAL #5   Title  Pt will verbalize understanding of fall prevention in home environment.    Time  4    Period  Weeks    Status  New        PT Long Term Goals - 08/02/19 1815      PT LONG TERM GOAL #1   Title  Pt will be independent with progression of HEP for improved mobility and decreased falls.  TARGET 09/27/2019    Time  8    Period  Weeks    Status  New      PT LONG TERM GOAL #2   Title  Pt will improve TUG score to less than or equal to 13.5 seconds for decreased fall risk.    Time  8    Period  Weeks    Status  New       PT LONG TERM GOAL #3   Title  Pt will improve DGI to at least 19/24 for decreased fall risk.    Time  8    Period  Weeks  PT LONG TERM GOAL #4   Title  Pt will improved gait velocity to at least 2.62 ft/sec for improved community mobility.    Time  8    Period  Weeks    Status  New      PT LONG TERM GOAL #5   Title  Pt will perform floor>stand transfer, with UE support, modified independently for improved fall recovery.    Time  8    Period  Weeks    Status  New             Plan - 08/02/19 1807    Clinical Impression Statement  Pt is a 64 year old female who has history of CVA with hemiplegia, from 2014.  She has had therapy in the distant past; however, she presents currently with multiple recet falls, decreased balance, decreased timing and coordination of gait, decreased stregnth and flexibility of LLE, decreased trunk strength, abnormal tone.  She is at fall risk per DGI and TUG scores and gait velocity is that of limited community ambulator.  She would benefit from skilled PT to address the above stated deficits for improved overall functional mobility and decreased falls.    Personal Factors and Comorbidities  Comorbidity 3+;Time since onset of injury/illness/exacerbation    Comorbidities  See problem list    Examination-Activity Limitations  Locomotion Level;Transfers;Stairs;Stand    Examination-Participation Restrictions  Community Activity    Stability/Clinical Decision Making  Evolving/Moderate complexity    Clinical Decision Making  Moderate    Rehab Potential  Good    PT Frequency  2x / week    PT Duration  8 weeks   plus eval   PT Treatment/Interventions  ADLs/Self Care Home Management;Aquatic Therapy;DME Instruction;Neuromuscular re-education;Balance training;Therapeutic exercise;Therapeutic activities;Functional mobility training;Stair training;Gait training;Patient/family education;Orthotic Fit/Training    PT Next Visit Plan  Assess BP; initiate HEP to  address trunk and LLE weakness, LLE stiffness; trial of foot-up brace or AFO (if pt brings appropriate lace-up shoe); discuss with [facility] aquatic therapy    Consulted and Agree with Plan of Care  Patient       Patient will benefit from skilled therapeutic intervention in order to improve the following deficits and impairments:  Abnormal gait, Decreased coordination, Difficulty walking, Impaired tone, Decreased activity tolerance, Decreased balance, Impaired flexibility, Decreased mobility, Decreased strength, Postural dysfunction  Visit Diagnosis: Other abnormalities of gait and mobility  Unsteadiness on feet  Muscle weakness (generalized)  Abnormal posture     Problem List Patient Active Problem List   Diagnosis Date Noted  . Hypoxemia   . Tracheostomy status ([place])   . Alcohol abuse with intoxication ([place]) 09/24/2017  . Alcohol withdrawal syndrome with complication, with unspecified complication ([place]) 38/75/6433  . Macrocytic anemia 09/24/2017  . Aortic atherosclerosis ([place]) 09/24/2017  . Tracheostomy care ([place]) 09/23/2017  . Tracheostomy in place [facility]) 09/23/2017  . Acute encephalopathy 09/23/2017  . MSSA (methicillin susceptible Staphylococcus aureus) pneumonia ([place]) 09/23/2017  . Severe protein-calorie malnutrition ([place]) 09/23/2017  . Acute respiratory failure ([place])   . Acute ischemic colitis ([place])   . Sepsis ([place]) 09/03/2017  . Alcohol use disorder, severe, dependence ([place]) 06/19/2016  . 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/13/2016  . Chest tightness   . History of stroke   . Left hemiparesis ([place])   . Stroke ([place])   . Chest pain 11/03/2014  . UTI (lower urinary tract infection) 11/03/2014  . HTN (hypertension) 11/03/2014  . ICH (intracerebral hemorrhage) ([place]) 05/30/2013  . Spastic hemiplegia  affecting nondominant side (HCC) 05/30/2013  . Abnormality of gait 05/30/2013  . 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 Disturbance of skin sensation  05/30/2013    Dee Paden W. 08/02/2019, 6:18 PM Gean Maidens., PT  [place] [facility] [address] [place], [place], [address] Phone: [phone]   Fax:  [phone]  Name: ANALIYA PORCO MRN: [id number] Date of Birth: 11-06-1955

## 2019-08-07 ENCOUNTER — Other Ambulatory Visit: Payer: Self-pay

## 2019-08-07 ENCOUNTER — Ambulatory Visit: Payer: Medicare Other | Admitting: Physical Therapy

## 2019-08-07 ENCOUNTER — Encounter: Payer: Self-pay | Admitting: Physical Therapy

## 2019-08-07 VITALS — BP 157/94

## 2019-08-07 DIAGNOSIS — R2689 Other abnormalities of gait and mobility: Secondary | ICD-10-CM | POA: Diagnosis not present

## 2019-08-07 DIAGNOSIS — M6281 Muscle weakness (generalized): Secondary | ICD-10-CM

## 2019-08-07 DIAGNOSIS — R2681 Unsteadiness on feet: Secondary | ICD-10-CM

## 2019-08-07 NOTE — Patient Instructions (Signed)
Access Code: [email or web address] URL: [email or web address] Date: 08/07/2019 Prepared by: Sallyanne Kuster  Exercises Sit to/from Stand in Stride position - 1 x daily - 5 x weekly - 1 sets - 10 reps Standing Terminal Knee Extension with Resistance - 1 x daily - 5 x weekly - 1 sets - 10 reps - 5 hold

## 2019-08-07 NOTE — Therapy (Signed)
[facility] [address] [place], [place], [address] Phone: [phone]   Fax:  [phone]  Physical Therapy Treatment  Patient Details  Name: Carla Bowen MRN: [id number] Date of Birth: [birth year] Referring Provider (PT): Nadyne Coombes MD   Encounter Date: 08/07/2019  PT End of Session - 08/07/19 1238    Visit Number  2    Number of Visits  17    Date for PT Re-Evaluation  10/31/19   90 day cert for 8 week POC   Authorization Type  [employer] Medicare    Progress Note Due on Visit  10    PT Start Time  1233    PT Stop Time  1316    PT Time Calculation (min)  43 min    Equipment Utilized During Treatment  Gait belt;Other (comment)   trial of AFO's   Activity Tolerance  Patient tolerated treatment well    Behavior During Therapy  [facility] for tasks assessed/performed       Past Medical History:  Diagnosis Date  . Anxiety   . Aortic atherosclerosis (HCC) 09/24/2017  . Depression   . Fibromyalgia   . High cholesterol   . Hypertension   . Stroke [facility])     Past Surgical History:  Procedure Laterality Date  . CESAREAN SECTION      Vitals:   08/07/19 1236  BP: (!) 157/94    Subjective Assessment - 08/07/19 1236    Subjective  No new complaints. No falls. PCP changed her muscle relaxer to a new one a few days ago, will bring the name in next session.    Pertinent History  See problem list    Patient Stated Goals  I want to be able to walk normally and dance; to not be fearful of steps.    Currently in Pain?  Yes    Pain Score  7     Pain Location  Other (Comment)   whole left side   Pain Orientation  Left;Upper;Lower    Pain Descriptors / Indicators  Burning;Numbness;Tingling    Pain Type  Chronic pain;Neuropathic pain    Pain Onset  More than a month ago    Pain Frequency  Constant    Aggravating Factors   hurts all the time    Pain Relieving Factors  when I sleep              [facility] Adult PT  Treatment/Exercise - 08/07/19 1242      Transfers   Transfers  Sit to Stand;Stand to Sit    Sit to Stand  5: Supervision;Without upper extremity assist;From chair/3-in-1    Stand to Sit  5: Supervision;Without upper extremity assist;To chair/3-in-1      Ambulation/Gait   Ambulation/Gait  Yes    Ambulation/Gait Assistance  5: Supervision;4: Min guard    Ambulation/Gait Assistance Details  using clinic size 7.5 shoes with laces trialed both posterior [place] AFO and posterior cuff [place] brace with improved foot clearance noted, decreased circumduction, however continued genu recurvatum noted.     Ambulation Distance (Feet)  50 Feet   x2 with brace trial, plus around session   Assistive device  None    Gait Pattern  Step-through pattern;Decreased arm swing - left;Decreased step length - right;Decreased stance time - left;Decreased hip/knee flexion - left;Decreased dorsiflexion - left;Decreased weight shift to left;Left circumduction;Left genu recurvatum;Ataxic;Decreased trunk rotation;Poor foot clearance - left    Ambulation Surface  Level;Indoor  Exercises   Exercises  Other Exercises    Other Exercises   initiated HEP to address left LE strengthening. Refer to Med[employer] program for details. cues needed on ex form and technique.       Access Code: [email or web address] URL: [email or web address] Date: 08/07/2019 Prepared by: Sallyanne Kuster  Exercises Sit to/from Stand in Stride position - 1 x daily - 5 x weekly - 1 sets - 10 reps Standing Terminal Knee Extension with Resistance - 1 x daily - 5 x weekly - 1 sets - 10 reps - 5 hold       PT Education - 08/07/19 1413    Education Details  purpose of AFO on left LE for support/stability; initial HEP to address left LE strengthening.    Person(s) Educated  Patient    Methods  Explanation;Demonstration;Verbal cues;Handout    Comprehension  Verbalized understanding;Returned demonstration;Verbal cues required;Need further  instruction       PT Short Term Goals - 08/02/19 1812      PT SHORT TERM GOAL #1   Title  Pt will be independent with HEP to address strength, balance, gait for improved functional mobility.  TARGET 08/30/2019    Time  4    Period  Weeks    Status  New      PT SHORT TERM GOAL #2   Title  Pt will improve 5x sit<>stand to less than or equal to 12 seconds to demonstrate improved functional lower extremity strength.    Time  4    Period  Weeks    Status  New      PT SHORT TERM GOAL #3   Title  Pt will improve DGI score to at least 15/24 for decreased fall risk.    Time  4    Period  Weeks    Status  New      PT SHORT TERM GOAL #4   Title  Pt will negotiate at least 4 steps with handrail with step through pattern, with supervision for improved entry/exit into home.    Time  4    Period  Weeks    Status  New      PT SHORT TERM GOAL #5   Title  Pt will verbalize understanding of fall prevention in home environment.    Time  4    Period  Weeks    Status  New        PT Long Term Goals - 08/02/19 1815      PT LONG TERM GOAL #1   Title  Pt will be independent with progression of HEP for improved mobility and decreased falls.  TARGET 09/27/2019    Time  8    Period  Weeks    Status  New      PT LONG TERM GOAL #2   Title  Pt will improve TUG score to less than or equal to 13.5 seconds for decreased fall risk.    Time  8    Period  Weeks    Status  New      PT LONG TERM GOAL #3   Title  Pt will improve DGI to at least 19/24 for decreased fall risk.    Time  8    Period  Weeks      PT LONG TERM GOAL #4   Title  Pt will improved gait velocity to at least 2.62 ft/sec for improved community mobility.    Time  8    Period  Weeks  Status  New      PT LONG TERM GOAL #5   Title  Pt will perform floor>stand transfer, with UE support, modified independently for improved fall recovery.    Time  8    Period  Weeks    Status  New            Plan - 08/07/19 1238     Clinical Impression Statement  Today's skilled session initially addressed use of various braces to left LE for improved stabilty with varied results and continued recurvatum noted. Will need to continue with brace trials, possibly with orthotist present as pt may need custom brace. Primary PT in agreement with asking the doctor for a brace order. Remainder of session focused on establisment of an HEP to address left LE strengthening. The pt is progressing and should benefit from further PT to progress toward unmet goals    Personal Factors and Comorbidities  Comorbidity 3+;Time since onset of injury/illness/exacerbation    Comorbidities  See problem list    Examination-Activity Limitations  Locomotion Level;Transfers;Stairs;Stand    Examination-Participation Restrictions  Community Activity    Stability/Clinical Decision Making  Evolving/Moderate complexity    Rehab Potential  Good    PT Frequency  2x / week    PT Duration  8 weeks   plus eval   PT Treatment/Interventions  ADLs/Self Care Home Management;Aquatic Therapy;DME Instruction;Neuromuscular re-education;Balance training;Therapeutic exercise;Therapeutic activities;Functional mobility training;Stair training;Gait training;Patient/family education;Orthotic Fit/Training    PT Next Visit Plan  Assess BP; add to HEP to address trunk and LLE weakness, LLE stiffness; continue with trial of braces, ? with heel wedge, for left LE stability/improved gait mechanics (?did MD order return)    PT Home Exercise Plan  Access Code: [email or web address]    Consulted and Agree with Plan of Care  Patient       Patient will benefit from skilled therapeutic intervention in order to improve the following deficits and impairments:  Abnormal gait, Decreased coordination, Difficulty walking, Impaired tone, Decreased activity tolerance, Decreased balance, Impaired flexibility, Decreased mobility, Decreased strength, Postural dysfunction  Visit Diagnosis: Other abnormalities of  gait and mobility  Unsteadiness on feet  Muscle weakness (generalized)     Problem List Patient Active Problem List   Diagnosis Date Noted  . Hypoxemia   . Tracheostomy status ([place])   . Alcohol abuse with intoxication ([place]) 09/24/2017  . Alcohol withdrawal syndrome with complication, with unspecified complication ([place]) 54/11/8117  . Macrocytic anemia 09/24/2017  . Aortic atherosclerosis ([place]) 09/24/2017  . Tracheostomy care ([place]) 09/23/2017  . Tracheostomy in place [facility] 09/23/2017  . Acute encephalopathy 09/23/2017  . MSSA (methicillin susceptible Staphylococcus aureus) pneumonia ([place]) 09/23/2017  . Severe protein-calorie malnutrition ([place]) 09/23/2017  . Acute respiratory failure ([place])   . Acute ischemic colitis ([place])   . Sepsis ([place]) 09/03/2017  . Alcohol use disorder, severe, dependence ([place]) 06/19/2016  . 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/13/2016  . Chest tightness   . History of stroke   . Left hemiparesis ([place])   . Stroke ([place])   . Chest pain 11/03/2014  . UTI (lower urinary tract infection) 11/03/2014  . HTN (hypertension) 11/03/2014  . ICH (intracerebral hemorrhage) ([place]) 05/30/2013  . Spastic hemiplegia affecting nondominant side ([place]) 05/30/2013  . Abnormality of gait 05/30/2013  . 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 Disturbance of skin sensation 05/30/2013    Willow Ora, PTA, [facility] [address] [place], [place] [address] [phone] 08/07/19, 2:20 PM  Name: SHERRI MCARTHY MRN: [id number] Date of Birth: [birth year]

## 2019-08-09 ENCOUNTER — Ambulatory Visit: Payer: Medicare Other | Admitting: Physical Therapy

## 2019-08-13 ENCOUNTER — Other Ambulatory Visit: Payer: Self-pay

## 2019-08-13 ENCOUNTER — Encounter: Payer: Self-pay | Admitting: Physical Therapy

## 2019-08-13 ENCOUNTER — Ambulatory Visit: Payer: Medicare Other | Attending: Family Medicine | Admitting: Physical Therapy

## 2019-08-13 DIAGNOSIS — M6281 Muscle weakness (generalized): Secondary | ICD-10-CM | POA: Diagnosis present

## 2019-08-13 DIAGNOSIS — R2681 Unsteadiness on feet: Secondary | ICD-10-CM | POA: Insufficient documentation

## 2019-08-14 NOTE — Therapy (Signed)
[facility] [address] [place], [place], [address] Phone: [phone]   Fax:  [phone]  Physical Therapy Treatment  Patient Details  Name: Carla Bowen MRN: [id number] Date of Birth: [birth year] Referring Provider (PT): Nadyne Coombes MD   Encounter Date: 08/13/2019  PT End of Session - 08/13/19 1106    Visit Number  3    Number of Visits  17    Date for PT Re-Evaluation  10/31/19   90 day cert for 8 week POC   Authorization Type  [employer]HC Medicare    Progress Note Due on Visit  10    PT Start Time  1102    PT Stop Time  1145    PT Time Calculation (min)  43 min    Equipment Utilized During Treatment  --    Activity Tolerance  Patient tolerated treatment well;No increased pain;Patient limited by pain    Behavior During Therapy  [facility] for tasks assessed/performed       Past Medical History:  Diagnosis Date  . Anxiety   . Aortic atherosclerosis (HCC) 09/24/2017  . Depression   . Fibromyalgia   . High cholesterol   . Hypertension   . Stroke [facility])     Past Surgical History:  Procedure Laterality Date  . CESAREAN SECTION      There were no vitals filed for this visit.  Subjective Assessment - 08/13/19 1105    Subjective  Sick all weekend from food poisioning, better today. Having left hip pain today, more than usual.    Pertinent History  See problem list    Patient Stated Goals  I want to be able to walk normally and dance; to not be fearful of steps.    Currently in Pain?  Yes    Pain Score  7     Pain Location  Generalized   left hip and buttocks   Pain Orientation  Left;Upper;Lower    Pain Descriptors / Indicators  Burning;Numbness;Tingling    Pain Type  Chronic pain;Neuropathic pain    Pain Onset  More than a month ago    Pain Frequency  Constant    Aggravating Factors   hurts all the time    Pain Relieving Factors  "noting"            [facility] Adult PT Treatment/Exercise - 08/13/19 1107      Transfers   Transfers  Sit to Stand;Stand to Sit    Sit to Stand  5: Supervision;Without upper extremity assist;From chair/3-in-1    Stand to Sit  5: Supervision;Without upper extremity assist;To chair/3-in-1      Ambulation/Gait   Ambulation/Gait  Yes    Ambulation/Gait Assistance  5: Supervision    Ambulation/Gait Assistance Details  around gym with session    Assistive device  None    Gait Pattern  Step-through pattern;Decreased arm swing - left;Decreased step length - right;Decreased stance time - left;Decreased hip/knee flexion - left;Decreased dorsiflexion - left;Decreased weight shift to left;Left circumduction;Left genu recurvatum;Ataxic;Decreased trunk rotation;Poor foot clearance - left    Ambulation Surface  Level;Indoor      Knee/Hip Exercises: Stretches   Active Hamstring Stretch  Left;3 reps;30 seconds    Active Hamstring Stretch Limitations  with strap around left foot with assist for form and technique.    Other Knee/Hip Stretches  double knee to chest stretch for 30 sec's x 3 reps, cues for gentle stretch and hold times.       Knee/Hip  Exercises: Seated   Other Seated Knee/Hip Exercises  sit to stand for 5 reps with single UE support, limited reps due to pain in left hip/buttocks    Hamstring Curl  AAROM;Strengthening;Left;2 sets;10 reps;Limitations    Hamstring Limitations  with red band resistance, assist to stabilize knee on mat with cues for controlled movements      Knee/Hip Exercises: Supine   Short Arc Quad Sets  AAROM;Strengthening;Left;2 sets;10 reps;Limitations    Short Arc Quad Sets Limitations  with 2# ankle weight, assist for controlled lowering of foot to mat table    Straight Leg Raises  AAROM;Strengthening;Left;2 sets;10 reps;Limitations    Straight Leg Raises Limitations  assist needed for form/technique and essentric control with lowering back down to mat table    Other Supine Knee/Hip Exercises  with green band around bil knees: left hip fall outs  for 10 reps with assist to stabilize right LE, then bil clam shells for 10 reps with cues on form/technique.       Knee/Hip Exercises: Sidelying   Hip ABduction  AAROM;Strengthening;Left;1 set;10 reps;Limitations    Hip ABduction Limitations  assist needed for form and technique.     Clams  green band resistance for 10 reps with assist to stabilize pelvis and cues to keep feet together.                PT Short Term Goals - 08/02/19 1812      PT SHORT TERM GOAL #1   Title  Pt will be independent with HEP to address strength, balance, gait for improved functional mobility.  TARGET 08/30/2019    Time  4    Period  Weeks    Status  New      PT SHORT TERM GOAL #2   Title  Pt will improve 5x sit<>stand to less than or equal to 12 seconds to demonstrate improved functional lower extremity strength.    Time  4    Period  Weeks    Status  New      PT SHORT TERM GOAL #3   Title  Pt will improve DGI score to at least 15/24 for decreased fall risk.    Time  4    Period  Weeks    Status  New      PT SHORT TERM GOAL #4   Title  Pt will negotiate at least 4 steps with handrail with step through pattern, with supervision for improved entry/exit into home.    Time  4    Period  Weeks    Status  New      PT SHORT TERM GOAL #5   Title  Pt will verbalize understanding of fall prevention in home environment.    Time  4    Period  Weeks    Status  New        PT Long Term Goals - 08/02/19 1815      PT LONG TERM GOAL #1   Title  Pt will be independent with progression of HEP for improved mobility and decreased falls.  TARGET 09/27/2019    Time  8    Period  Weeks    Status  New      PT LONG TERM GOAL #2   Title  Pt will improve TUG score to less than or equal to 13.5 seconds for decreased fall risk.    Time  8    Period  Weeks    Status  New  PT LONG TERM GOAL #3   Title  Pt will improve DGI to at least 19/24 for decreased fall risk.    Time  8    Period  Weeks       PT LONG TERM GOAL #4   Title  Pt will improved gait velocity to at least 2.62 ft/sec for improved community mobility.    Time  8    Period  Weeks    Status  New      PT LONG TERM GOAL #5   Title  Pt will perform floor>stand transfer, with UE support, modified independently for improved fall recovery.    Time  8    Period  Weeks    Status  New            Plan - 08/13/19 1107    Clinical Impression Statement  Today's skilled session focused on LE strengthening with rest breaks taken as needed due to pain. No increase in pain reported at end of session. The pt is progressing toward goals and should benefit from continued PT to progress toward unmet goals.    Personal Factors and Comorbidities  Comorbidity 3+;Time since onset of injury/illness/exacerbation    Comorbidities  See problem list    Examination-Activity Limitations  Locomotion Level;Transfers;Stairs;Stand    Examination-Participation Restrictions  Community Activity    Stability/Clinical Decision Making  Evolving/Moderate complexity    Rehab Potential  Good    PT Frequency  2x / week    PT Duration  8 weeks   plus eval   PT Treatment/Interventions  ADLs/Self Care Home Management;Aquatic Therapy;DME Instruction;Neuromuscular re-education;Balance training;Therapeutic exercise;Therapeutic activities;Functional mobility training;Stair training;Gait training;Patient/family education;Orthotic Fit/Training    PT Next Visit Plan  Assess BP; exercises to  address trunk and LLE weakness, LLE stiffness; continue with trial of braces, ? with heel wedge, for left LE stability/improved gait mechanics (?did MD order return)    PT Home Exercise Plan  Access Code: [email or web address]    Consulted and Agree with Plan of Care  Patient       Patient will benefit from skilled therapeutic intervention in order to improve the following deficits and impairments:  Abnormal gait, Decreased coordination, Difficulty walking, Impaired tone, Decreased activity  tolerance, Decreased balance, Impaired flexibility, Decreased mobility, Decreased strength, Postural dysfunction  Visit Diagnosis: Unsteadiness on feet  Muscle weakness (generalized)     Problem List Patient Active Problem List   Diagnosis Date Noted  . Hypoxemia   . Tracheostomy status ([place])   . Alcohol abuse with intoxication ([place]) 09/24/2017  . Alcohol withdrawal syndrome with complication, with unspecified complication ([place]) 83/15/1761  . Macrocytic anemia 09/24/2017  . Aortic atherosclerosis ([place]) 09/24/2017  . Tracheostomy care ([place]) 09/23/2017  . Tracheostomy in place [facility]) 09/23/2017  . Acute encephalopathy 09/23/2017  . MSSA (methicillin susceptible Staphylococcus aureus) pneumonia ([place]) 09/23/2017  . Severe protein-calorie malnutrition ([place]) 09/23/2017  . Acute respiratory failure ([place])   . Acute ischemic colitis ([place])   . Sepsis ([place]) 09/03/2017  . Alcohol use disorder, severe, dependence ([place]) 06/19/2016  . 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/13/2016  . Chest tightness   . History of stroke   . Left hemiparesis ([place])   . Stroke ([place])   . Chest pain 11/03/2014  . UTI (lower urinary tract infection) 11/03/2014  . HTN (hypertension) 11/03/2014  . ICH (intracerebral hemorrhage) ([place]) 05/30/2013  . Spastic hemiplegia affecting nondominant side ([place]) 05/30/2013  . Abnormality of gait 05/30/2013  . 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  Disturbance of skin sensation 05/30/2013    Name: SHELVA HETZER MRN: [id number] Date of Birth: Sep 18, 1955  Sallyanne Kuster, PTA, [facility] Outpatient Neuro [facility] [address] [place], [place] [address] [phone] 08/14/19, 12:13 PM

## 2019-08-15 ENCOUNTER — Ambulatory Visit: Payer: Medicare Other | Admitting: Physical Therapy

## 2019-08-20 ENCOUNTER — Ambulatory Visit: Payer: Medicare Other | Admitting: Physical Therapy

## 2019-08-23 ENCOUNTER — Ambulatory Visit: Payer: Medicare Other | Admitting: Physical Therapy

## 2019-08-27 ENCOUNTER — Ambulatory Visit: Payer: Medicare Other | Admitting: Physical Therapy

## 2019-08-30 ENCOUNTER — Ambulatory Visit: Payer: Medicare Other | Admitting: Physical Therapy

## 2019-09-03 ENCOUNTER — Ambulatory Visit: Payer: Medicare Other

## 2019-09-03 ENCOUNTER — Encounter (INDEPENDENT_AMBULATORY_CARE_PROVIDER_SITE_OTHER): Payer: Medicare Other | Admitting: Podiatry

## 2019-09-03 DIAGNOSIS — M778 Other enthesopathies, not elsewhere classified: Secondary | ICD-10-CM

## 2019-09-03 NOTE — Progress Notes (Signed)
This encounter was created in error - please disregard.

## 2019-10-09 ENCOUNTER — Ambulatory Visit: Payer: Medicare Other | Admitting: Allergy

## 2019-11-05 ENCOUNTER — Encounter: Payer: Self-pay | Admitting: Physical Therapy

## 2019-11-05 NOTE — Therapy (Signed)
[facility] [address] [place], [place], [address] Phone: [phone]   Fax:  [phone]  Patient Details  Name: Carla Bowen MRN: [id number] Date of Birth: 26-Oct-1955 Referring Provider:  No ref. provider found  Encounter Date: 11/05/2019  PHYSICAL THERAPY DISCHARGE SUMMARY  Visits from Start of Care: 3  Current functional level related to goals / functional outcomes: See eval; STGs/LTGs not able to be fully addressed, as pt only returned for 2 visits beyond eval.   Remaining deficits: See eval   Education / Equipment: Unable to fully address, as pt did not return after 2 visits.  Plan: Patient agrees to discharge.  Patient goals were not met. Patient is being discharged due to financial reasons.  ?????       [name] W. 11/05/2019, 12:54 PM Mady Haagensen, PT 11/05/19 12:55 PM Phone: [phone] Fax: [facility] [address] [place], [place], [address] Phone: [phone]   Fax:  [phone]

## 2019-12-21 ENCOUNTER — Emergency Department (HOSPITAL_COMMUNITY): Payer: Medicare Other

## 2019-12-21 ENCOUNTER — Other Ambulatory Visit: Payer: Self-pay

## 2019-12-21 ENCOUNTER — Emergency Department (HOSPITAL_COMMUNITY)
Admission: EM | Admit: 2019-12-21 | Discharge: 2019-12-21 | Disposition: A | Discharge status: Home / Self Care | Payer: Medicare Other | Attending: Emergency Medicine | Admitting: Emergency Medicine

## 2019-12-21 DIAGNOSIS — J069 Acute upper respiratory infection, unspecified: Secondary | ICD-10-CM | POA: Diagnosis not present

## 2019-12-21 DIAGNOSIS — R0602 Shortness of breath: Secondary | ICD-10-CM | POA: Diagnosis present

## 2019-12-21 DIAGNOSIS — F17210 Nicotine dependence, cigarettes, uncomplicated: Secondary | ICD-10-CM | POA: Insufficient documentation

## 2019-12-21 DIAGNOSIS — Z20822 Contact with and (suspected) exposure to covid-19: Secondary | ICD-10-CM | POA: Diagnosis not present

## 2019-12-21 DIAGNOSIS — Z79899 Other long term (current) drug therapy: Secondary | ICD-10-CM | POA: Diagnosis not present

## 2019-12-21 DIAGNOSIS — I10 Essential (primary) hypertension: Secondary | ICD-10-CM | POA: Diagnosis not present

## 2019-12-21 DIAGNOSIS — J441 Chronic obstructive pulmonary disease with (acute) exacerbation: Secondary | ICD-10-CM | POA: Diagnosis not present

## 2019-12-21 DIAGNOSIS — Z7982 Long term (current) use of aspirin: Secondary | ICD-10-CM | POA: Insufficient documentation

## 2019-12-21 DIAGNOSIS — Z930 Tracheostomy status: Secondary | ICD-10-CM | POA: Insufficient documentation

## 2019-12-21 LAB — BASIC METABOLIC PANEL
Anion gap: 15 (ref 5–15)
BUN: 21 mg/dL (ref 8–23)
CO2: 22 mmol/L (ref 22–32)
Calcium: 9.7 mg/dL (ref 8.9–10.3)
Chloride: 106 mmol/L (ref 98–111)
Creatinine, Ser: 0.81 mg/dL (ref 0.44–1.00)
GFR, Estimated: >60 mL/min (ref >60)
Glucose, Bld: 140 mg/dL — ABNORMAL HIGH (ref 70–99)
Potassium: 3.4 mmol/L — ABNORMAL LOW (ref 3.5–5.1)
Sodium: 143 mmol/L (ref 135–145)

## 2019-12-21 LAB — RESPIRATORY PANEL BY RT PCR (FLU A&B, COVID)
Influenza A by PCR: NEGATIVE
Influenza B by PCR: NEGATIVE
SARS Coronavirus 2 by RT PCR: NEGATIVE

## 2019-12-21 LAB — CBC
HCT: 49.5 % — ABNORMAL HIGH (ref 36.0–46.0)
Hemoglobin: 15.9 g/dL — ABNORMAL HIGH (ref 12.0–15.0)
MCH: 29.6 pg (ref 26.0–34.0)
MCHC: 32.1 g/dL (ref 30.0–36.0)
MCV: 92.2 fL (ref 80.0–100.0)
Platelets: 365 10*3/uL (ref 150–400)
RBC: 5.37 MIL/uL — ABNORMAL HIGH (ref 3.87–5.11)
RDW: 13.2 % (ref 11.5–15.5)
WBC: 8.8 10*3/uL (ref 4.0–10.5)
nRBC: 0 % (ref 0.0–0.2)

## 2019-12-21 MED ORDER — DM-GUAIFENESIN ER 30-600 MG PO TB12
1.0000 | ORAL_TABLET | Freq: Two times a day (BID) | ORAL | Status: DC
  Filled 2019-12-21: qty 1

## 2019-12-21 MED ORDER — BENZONATATE 100 MG PO CAPS
100.0000 mg | ORAL_CAPSULE | Freq: Once | ORAL | Status: AC
  Administered 2019-12-21: 100 mg via ORAL
  Filled 2019-12-21: qty 1

## 2019-12-21 MED ORDER — AMLODIPINE BESYLATE 5 MG PO TABS
10.0000 mg | ORAL_TABLET | Freq: Every day | ORAL | Status: DC
  Administered 2019-12-21: 10 mg
  Filled 2019-12-21: qty 2

## 2019-12-21 MED ORDER — PREDNISONE 20 MG PO TABS: 40.0000 mg | ORAL_TABLET | Freq: Once | ORAL | Status: DC

## 2019-12-21 MED ORDER — METHYLPREDNISOLONE SODIUM SUCC 125 MG IJ SOLR
125.0000 mg | Freq: Once | INTRAMUSCULAR | Status: AC
  Administered 2019-12-21: 125 mg via INTRAVENOUS
  Filled 2019-12-21: qty 2

## 2019-12-21 MED ORDER — DM-GUAIFENESIN ER 30-600 MG PO TB12
1.0000 | ORAL_TABLET | Freq: Once | ORAL | Status: AC
  Administered 2019-12-21: 1 via ORAL
  Filled 2019-12-21: qty 1

## 2019-12-21 MED ORDER — BENZONATATE 100 MG PO CAPS: 100.0000 mg | ORAL_CAPSULE | Freq: Three times a day (TID) | ORAL | 0 refills | Status: AC | PRN

## 2019-12-21 MED ORDER — METOPROLOL TARTRATE 25 MG PO TABS
25.0000 mg | ORAL_TABLET | Freq: Once | ORAL | Status: AC
  Administered 2019-12-21: 25 mg via ORAL

## 2019-12-21 MED ORDER — GUAIFENESIN ER 600 MG PO TB12: 600.0000 mg | ORAL_TABLET | Freq: Two times a day (BID) | ORAL | 0 refills | Status: AC

## 2019-12-21 MED ORDER — PREDNISONE 10 MG PO TABS: 40.0000 mg | ORAL_TABLET | Freq: Every day | ORAL | 0 refills | Status: AC

## 2019-12-21 MED ORDER — ALBUTEROL SULFATE HFA 108 (90 BASE) MCG/ACT IN AERS
2.0000 | INHALATION_SPRAY | Freq: Once | RESPIRATORY_TRACT | Status: AC
  Administered 2019-12-21: 2 via RESPIRATORY_TRACT
  Filled 2019-12-21: qty 6.7

## 2019-12-21 MED ORDER — METOPROLOL TARTRATE 25 MG PO TABS
25.0000 mg | ORAL_TABLET | Freq: Two times a day (BID) | ORAL | Status: DC
  Filled 2019-12-21: qty 1

## 2019-12-21 NOTE — ED Provider Notes (Signed)
[facility]   CSN: [id number] Arrival date & time: 12/21/19  1536     History Chief Complaint  Patient presents with  . Shortness of Breath    Carla SchlatterBonnie M Bowen is a 64 y.o. female with past medical history of depression, CVA with left hemiplegic, tobacco abuse who presents to the ED for chief complaining of feeling sick and shortness of breath for 10 days.  Patient states that his symptoms include coughing, shortness of breath, body aches and sinus pressure.  She states that she went to her physician and had negative Covid test.  She was prescribed doxycycline for 5 days which she completed.  She is also prescribed prednisone which she is on day 2.  Patient said that her son also exhibited the same symptoms at the same time but resolved within 3 days.  Patient states that she smokes about 1/2 pack a day for more than 50 years.  Patient states that she does not want to quit.  She was not diagnosed with COPD and has not had a PFT.  Patient also denies history of traveling prolonged flight or DVT in the past.  Patient also denies chest pain or calf pains.  HPI     Past Medical History:  Diagnosis Date  . Anxiety   . Aortic atherosclerosis (HCC) 09/24/2017  . Depression   . Fibromyalgia   . High cholesterol   . Hypertension   . Stroke [facility](HCC)     Patient Active Problem List   Diagnosis Date Noted  . Viral upper respiratory infection 12/21/2019  . COPD exacerbation (HCC) 12/21/2019  . Hypoxemia   . Tracheostomy status (HCC)   . Alcohol abuse with intoxication (HCC) 09/24/2017  . Alcohol withdrawal syndrome with complication, with unspecified complication (HCC) 09/24/2017  . Macrocytic anemia 09/24/2017  . Aortic atherosclerosis (HCC) 09/24/2017  . Tracheostomy care (HCC) 09/23/2017  . Tracheostomy in place [facility](HCC) 09/23/2017  . Acute encephalopathy 09/23/2017  . MSSA (methicillin susceptible Staphylococcus aureus) pneumonia (HCC)  09/23/2017  . Severe protein-calorie malnutrition (HCC) 09/23/2017  . Acute respiratory failure (HCC)   . Acute ischemic colitis (HCC)   . Sepsis (HCC) 09/03/2017  . Alcohol use disorder, severe, dependence (HCC) 06/19/2016  . 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/13/2016  . Chest tightness   . History of stroke   . Left hemiparesis (HCC)   . Stroke (HCC)   . Chest pain 11/03/2014  . UTI (lower urinary tract infection) 11/03/2014  . HTN (hypertension) 11/03/2014  . ICH (intracerebral hemorrhage) (HCC) 05/30/2013  . Spastic hemiplegia affecting nondominant side (HCC) 05/30/2013  . Abnormality of gait 05/30/2013  . 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 Disturbance of skin sensation 05/30/2013    Past Surgical History:  Procedure Laterality Date  . CESAREAN SECTION       OB History    Gravida  1   Para  1   Term  1   Preterm      AB      Living        SAB      TAB      Ectopic      Multiple      Live Births              Family History  Problem Relation Age of Onset  . Lung cancer Father   . Diabetes Brother   . Heart attack Brother   . Hypertension Brother   .  Hypertension Sister     Social History   Tobacco Use  . Smoking status: Current Every Day Smoker    Packs/day: 0.50    Years: 20.00    Pack years: 10.00  . Smokeless tobacco: Never Used  Vaping Use  . Vaping Use: Unknown  Substance Use Topics  . Alcohol use: Not Currently    Alcohol/week: 10.0 standard drinks    Types: 5 Cans of beer, 5 Shots of liquor per week    Comment: BAC was 247- last drink 08/2017  . Drug use: No    Home Medications Prior to Admission medications   Medication Sig Start Date End Date Taking? Authorizing Provider  amLODipine (NORVASC) 10 MG tablet Place 1 tablet (10 mg total) into feeding tube daily. Patient not taking: Reported on 08/02/2019 09/26/17   Standley Brooking, MD  ARIPiprazole (ABILIFY) 5 MG tablet Place 1 tablet (5 mg total) into  feeding tube daily. Patient not taking: Reported on 08/02/2019 09/26/17   Standley Brooking, MD  aspirin EC 81 MG tablet Take 81 mg by mouth daily.    [provider]  benzonatate (TESSALON PERLES) 100 MG capsule Take 1 capsule (100 mg total) by mouth 3 (three) times daily as needed for up to 7 days for cough. 12/21/19 12/28/19  Doran Stabler, DO  buPROPion (WELLBUTRIN) 100 MG tablet Place 0.5 tablets (50 mg total) into feeding tube 2 (two) times daily. Patient not taking: Reported on 12/07/2018 09/25/17   Standley Brooking, MD  Cholecalciferol (VITAMIN D3) 50 MCG (2000 UT) TABS Take by mouth.    [provider]  cyclobenzaprine (FLEXERIL) 10 MG tablet Take 10 mg by mouth 3 (three) times daily as needed. 11/26/18   [provider]  diltiazem (CARDIZEM) 10 mg/ml oral suspension Place 6 mLs (60 mg total) into feeding tube every 6 (six) hours. Patient not taking: Reported on 08/02/2019 09/25/17   Standley Brooking, MD  diltiazem (CARDIZEM) 60 MG tablet Take 60 mg by mouth 2 (two) times daily.    [provider]  famotidine (PEPCID) 40 MG/5ML suspension Place 2.5 mLs (20 mg total) into feeding tube every 12 (twelve) hours. Patient not taking: Reported on 12/07/2018 09/25/17   Standley Brooking, MD  fluticasone [facility]) 50 MCG/ACT nasal spray Place into both nostrils daily.    [provider]  folic acid (FOLVITE) 1 MG tablet Place 1 tablet (1 mg total) into feeding tube daily. Patient not taking: Reported on 12/07/2018 09/26/17   Standley Brooking, MD  gabapentin (NEURONTIN) 100 MG capsule Take 100 mg by mouth 2 (two) times daily.    [provider]  gabapentin (NEURONTIN) 250 MG/5ML solution Place 2 mLs (100 mg total) into feeding tube at bedtime. Patient not taking: Reported on 08/02/2019 09/25/17   Standley Brooking, MD  gabapentin (NEURONTIN) 600 MG tablet Take 600 mg by mouth. 1 tablet by mouth at night    [provider]  guaiFENesin  (MUCINEX) 600 MG 12 hr tablet Take 1 tablet (600 mg total) by mouth 2 (two) times daily for 7 days. 12/21/19 12/28/19  Doran Stabler, DO  ibuprofen (ADVIL) 800 MG tablet TAKE 1 (ONE) TABLET PER MOUTH THREE TIMES A DAY 11/21/18   [provider]  lamoTRIgine (LAMICTAL) 25 MG tablet Place 1 tablet (25 mg total) into feeding tube 2 (two) times daily. Patient not taking: Reported on 12/07/2018 09/25/17   Standley Brooking, MD  levocetirizine Elita Boone) 5 MG tablet  12/05/18  [provider]  losartan (COZAAR) 50 MG tablet Take 50 mg by mouth daily.    [provider]  metoprolol tartrate (LOPRESSOR) 25 MG tablet Take 25 mg by mouth 3 (three) times daily.    [provider]  metoprolol tartrate (LOPRESSOR) 25 mg/10 mL SUSP Place 10 mLs (25 mg total) into feeding tube 2 (two) times daily. Patient not taking: Reported on 08/02/2019 09/25/17   Standley Brooking, MD  Multiple Vitamin (MULTIVITAMIN) LIQD Place 15 mLs into feeding tube daily. Patient not taking: Reported on 12/07/2018 09/26/17   Standley Brooking, MD  Nutritional Supplements (FEEDING SUPPLEMENT, VITAL AF 1.2 CAL,) LIQD Place 1,500 mLs into feeding tube continuous. Patient not taking: Reported on 12/07/2018 09/25/17   Standley Brooking, MD  omeprazole (PRILOSEC) 40 MG capsule TAKE 1 CAPSULE IN THE MORNING AND 1 CAPSULE AT NIGHT 10/23/18   [provider]  predniSONE (DELTASONE) 10 MG tablet Take 4 tablets (40 mg total) by mouth daily for 3 days. 12/21/19 12/24/19  Doran Stabler, DO  QUEtiapine (SEROQUEL) 100 MG tablet Place 1 tablet (100 mg total) into feeding tube 2 (two) times daily. Patient not taking: Reported on 12/07/2018 09/25/17   Standley Brooking, MD  rosuvastatin (CRESTOR) 10 MG tablet Place 1 tablet (10 mg total) into feeding tube at bedtime. Patient not taking: Reported on 08/02/2019 09/25/17   Standley Brooking, MD  thiamine 100 MG tablet Place 1 tablet (100 mg total) into feeding tube  daily. Patient not taking: Reported on 12/07/2018 09/26/17   Standley Brooking, MD  tizanidine (ZANAFLEX) 2 MG capsule Take 2 mg by mouth 3 (three) times daily.    Dois Davenport, MD  valACYclovir (VALTREX) 500 MG tablet Place 1 tablet (500 mg total) into feeding tube 2 (two) times daily. Patient not taking: Reported on 12/07/2018 09/25/17   Standley Brooking, MD    Allergies    Ciprofloxacin, Codeine, Sulfa antibiotics, and Tramadol  Review of Systems   Review of Systems  Constitutional: Positive for fever.  HENT: Positive for congestion, rhinorrhea and sinus pressure.   Respiratory: Positive for cough and shortness of breath. Negative for chest tightness and wheezing.   Cardiovascular: Negative for chest pain, palpitations and leg swelling.  Gastrointestinal: Negative for abdominal pain, diarrhea and vomiting.  Musculoskeletal: Positive for myalgias.  Neurological: Positive for headaches. Negative for weakness.    Physical Exam Updated Vital Signs BP (!) 184/98   Pulse 82   Temp 98.3 F (36.8 C) (Oral)   Resp (!) 21   Ht 5\' 1"  (1.549 m)   Wt 53.1 kg   LMP  (LMP Unknown)   SpO2 94%   BMI 22.11 kg/m   Physical Exam Constitutional:      General: She is not in acute distress.    Comments: Coughing on examinations  HENT:     Head: Normocephalic.  Eyes:     General:        Right eye: No discharge.        Left eye: No discharge.  Cardiovascular:     Rate and Rhythm: Normal rate and regular rhythm.  Pulmonary:     Effort: No respiratory distress.     Breath sounds: Normal breath sounds. No wheezing.  Abdominal:     Palpations: Abdomen is soft.  Musculoskeletal:        General: No tenderness.     Right lower leg: No edema.     Left lower leg: No edema.  Skin:    General: Skin is warm.     Coloration: Skin is not jaundiced.  Neurological:     Mental Status: She is alert.  Psychiatric:        Mood and Affect: Mood normal.     ED Results / Procedures /  Treatments   Labs (all labs ordered are listed, but only abnormal results are displayed) Labs Reviewed  CBC - Abnormal; Notable for the following components:      Result Value   RBC 5.37 (*)    Hemoglobin 15.9 (*)    HCT 49.5 (*)    All other components within normal limits  BASIC METABOLIC PANEL - Abnormal; Notable for the following components:   Potassium 3.4 (*)    Glucose, Bld 140 (*)    All other components within normal limits  RESPIRATORY PANEL BY RT PCR (FLU A&B, COVID)    EKG EKG Interpretation  Date/Time:  Saturday December 21 2019 15:47:49 EDT Ventricular Rate:  80 PR Interval:  142 QRS Duration: 74 QT Interval:  400 QTC Calculation: 461 R Axis:   37 Text Interpretation: Normal sinus rhythm with sinus arrhythmia Nonspecific ST and T wave abnormality Abnormal ECG No significant change since last tracing Confirmed by Richardean Canal [phone]) on 12/21/2019 6:18:10 PM   Radiology DG Chest 2 View  Result Date: 12/21/2019 CLINICAL DATA:  64 year old female with shortness of breath. EXAM: CHEST - 2 VIEW COMPARISON:  Chest radiograph dated 09/18/2017. FINDINGS: The lungs are clear. There is no pleural effusion pneumothorax. The cardiac silhouette is within limits. No acute osseous pathology. IMPRESSION: No active cardiopulmonary disease. Electronically Signed   By: Elgie Collard M.D.   On: 12/21/2019 16:37    Procedures Procedures (including critical care time)  Medications Ordered in ED Medications  amLODipine (NORVASC) tablet 10 mg (10 mg Per Tube Given 12/21/19 2017)  albuterol (VENTOLIN HFA) 108 (90 Base) MCG/ACT inhaler 2 puff (2 puffs Inhalation Given 12/21/19 2018)  benzonatate (TESSALON) capsule 100 mg (100 mg Oral Given 12/21/19 2017)  methylPREDNISolone sodium succinate (SOLU-MEDROL) 125 mg/2 mL injection 125 mg (125 mg Intravenous Given 12/21/19 2013)  dextromethorphan-guaiFENesin (MUCINEX DM) 30-600 MG per 12 hr tablet 1 tablet (1 tablet Oral Given 12/21/19 2058)   metoprolol tartrate (LOPRESSOR) tablet 25 mg (25 mg Oral Given 12/21/19 2046)    ED Course  I have reviewed the triage vital signs and the nursing notes.  Pertinent labs & imaging results that were available during my care of the patient were reviewed by me and considered in my medical decision making (see chart for details).  Patient seen and examined.  She is coughing on examination, otherwise no toxic appearance.  Patient was seen by physician and had a negative Covid test.  Chest x-ray was clear and no leukocytosis.  This is likely viral upper respiratory infection with a component of COPD exacerbation.  Low suspicion for ACS or PE or CHF.  Normal echo on 09/04/2017.  We will treat her symptoms with Tessalon, benzonatate, Mucinex and steroids for presumed COPD exacerbation.  Her blood pressure was notified to be 215/90.  Patient say her baseline is 160/90.  This is due to her acute illness.  Patient only took her morning dose of Lopressor, will start start p.m. dose of amlodipine and Lopressor.   BP (!) 174/96 (BP Location: Right Arm)   Pulse 69   Temp 98.3 F (36.8 C) (Oral)   Resp 16   Ht 5\' 1"  (1.549 m)  Wt 53.1 kg   LMP  (LMP Unknown)   SpO2 94%   BMI 22.11 kg/m  Recheck blood pressure 166/102 before giving medications.  Reassess in 30 minutes.  Blood pressure rechecked 184/97.  Patient is stable for discharge.  Plan: Continue Tessalon, Mucinex and 3 more days of prednisone 40 mg.  Come back in the ED for worsening shortness of breath or chest pain.  Patient agrees with the plan.   MDM Rules/Calculators/A&P                          Patient presents to the ED for 10 days of shortness of breath, coughing, sinus pressure, and body aches.  Her son also exhibits the same symptoms but resolved within 3 days.  Patient was seen by a physician few days ago and was prescribed a 5-day of doxycycline which she finished and prednisone which she is on day 2.  This is likely viral upper chest  respiratory tract infection with a component of COPD exacerbation.  Low suspicion for ACS, PE or CHF.  Will treat symptomatically with Tessalon, benzonatate, steroid and Mucinex.  Blood pressure was elevated at 215/90 with baseline BP 160/90.  This is likely due to acute illness.  Restart her p.m. dose of Lopressor and add amlodipine.  Blood pressure recheck 160-180/90.  Patient stable for discharge.  Continue symptomatic treatment with Tessalon, Mucinex and 3 days of prednisone.  Come back to the ED for worsening symptoms.  Final Clinical Impression(s) / ED Diagnoses Final diagnoses:  Viral upper respiratory infection  COPD exacerbation (HCC)    Rx / DC Orders ED Discharge Orders         Ordered    benzonatate (TESSALON PERLES) 100 MG capsule  3 times daily PRN        12/21/19 2105    predniSONE (DELTASONE) 10 MG tablet  Daily        12/21/19 2105    guaiFENesin (MUCINEX) 600 MG 12 hr tablet  2 times daily        12/21/19 2105           Doran Stabler, DO 12/21/19 2114    Charlynne Pander, MD 12/21/19 2133

## 2019-12-21 NOTE — Discharge Instructions (Addendum)
Carla Bowen, it is a pleasure getting to know you today.  You came in to the ED for shortness of breath and feeling sick for 10 days.  This is likely a viral upper respiratory infection and a component of COPD exacerbation.  We will treat your symptoms with Tessalon for cough, Mucinex for congestion and 3 more days of prednisone for inflammation.  Please continue to take your blood pressure medication as instructed.  Come back to the ED for worsening shortness of breath, chest pain.

## 2019-12-21 NOTE — ED Triage Notes (Signed)
Pt with shortness of breath starting today, dizziness ongoing. Feeling generally sick x 10 days, went to PCP and dx with URI, three negative covid tests. C/o sinus pressure and pain when coughing.

## 2020-01-02 ENCOUNTER — Encounter (HOSPITAL_COMMUNITY): Payer: Self-pay | Admitting: Emergency Medicine

## 2020-01-02 ENCOUNTER — Emergency Department (HOSPITAL_COMMUNITY)
Admission: EM | Admit: 2020-01-02 | Discharge: 2020-01-02 | Disposition: A | Discharge status: Home / Self Care | Payer: Medicare Other | Attending: Emergency Medicine | Admitting: Emergency Medicine

## 2020-01-02 ENCOUNTER — Emergency Department (HOSPITAL_COMMUNITY): Payer: Medicare Other

## 2020-01-02 ENCOUNTER — Other Ambulatory Visit: Payer: Self-pay

## 2020-01-02 DIAGNOSIS — R059 Cough, unspecified: Secondary | ICD-10-CM | POA: Diagnosis not present

## 2020-01-02 DIAGNOSIS — I10 Essential (primary) hypertension: Secondary | ICD-10-CM | POA: Insufficient documentation

## 2020-01-02 DIAGNOSIS — Z7951 Long term (current) use of inhaled steroids: Secondary | ICD-10-CM | POA: Diagnosis not present

## 2020-01-02 DIAGNOSIS — Z7982 Long term (current) use of aspirin: Secondary | ICD-10-CM | POA: Diagnosis not present

## 2020-01-02 DIAGNOSIS — R0981 Nasal congestion: Secondary | ICD-10-CM | POA: Insufficient documentation

## 2020-01-02 DIAGNOSIS — F17200 Nicotine dependence, unspecified, uncomplicated: Secondary | ICD-10-CM | POA: Insufficient documentation

## 2020-01-02 DIAGNOSIS — J441 Chronic obstructive pulmonary disease with (acute) exacerbation: Secondary | ICD-10-CM | POA: Insufficient documentation

## 2020-01-02 DIAGNOSIS — Z79899 Other long term (current) drug therapy: Secondary | ICD-10-CM | POA: Insufficient documentation

## 2020-01-02 DIAGNOSIS — J3489 Other specified disorders of nose and nasal sinuses: Secondary | ICD-10-CM

## 2020-01-02 LAB — CBC
HCT: 47.2 % — ABNORMAL HIGH (ref 36.0–46.0)
Hemoglobin: 15.1 g/dL — ABNORMAL HIGH (ref 12.0–15.0)
MCH: 30 pg (ref 26.0–34.0)
MCHC: 32 g/dL (ref 30.0–36.0)
MCV: 93.7 fL (ref 80.0–100.0)
Platelets: 271 10*3/uL (ref 150–400)
RBC: 5.04 MIL/uL (ref 3.87–5.11)
RDW: 13.3 % (ref 11.5–15.5)
WBC: 7.7 10*3/uL (ref 4.0–10.5)
nRBC: 0 % (ref 0.0–0.2)

## 2020-01-02 LAB — TROPONIN I (HIGH SENSITIVITY): Troponin I (High Sensitivity): 4 ng/L (ref <18)

## 2020-01-02 LAB — BASIC METABOLIC PANEL
Anion gap: 9 (ref 5–15)
BUN: 15 mg/dL (ref 8–23)
CO2: 23 mmol/L (ref 22–32)
Calcium: 9.1 mg/dL (ref 8.9–10.3)
Chloride: 110 mmol/L (ref 98–111)
Creatinine, Ser: 0.6 mg/dL (ref 0.44–1.00)
GFR, Estimated: >60 mL/min (ref >60)
Glucose, Bld: 114 mg/dL — ABNORMAL HIGH (ref 70–99)
Potassium: 4 mmol/L (ref 3.5–5.1)
Sodium: 142 mmol/L (ref 135–145)

## 2020-01-02 MED ORDER — MECLIZINE HCL 25 MG PO TABS: 25.0000 mg | ORAL_TABLET | Freq: Three times a day (TID) | ORAL | 0 refills | Status: AC | PRN

## 2020-01-02 MED ORDER — AMLODIPINE BESYLATE 5 MG PO TABS
5.0000 mg | ORAL_TABLET | Freq: Once | ORAL | Status: AC
  Administered 2020-01-02: 5 mg via ORAL
  Filled 2020-01-02: qty 1

## 2020-01-02 NOTE — ED Notes (Signed)
Patient verbalizes understanding of discharge instructions. Opportunity for questioning and answers were provided. Armband removed by staff, pt discharged from ED ambulatory.   

## 2020-01-02 NOTE — ED Triage Notes (Signed)
Pt c/o cp, sob and high blood pressure since last night.

## 2020-01-02 NOTE — Discharge Instructions (Signed)
Please follow-up closely with your primary care doctor for further managements of your high blood pressure and is also being evaluated for your sinus pain.  Take meclizine as needed for dizziness.  Return if you have any concern

## 2020-01-02 NOTE — ED Provider Notes (Signed)
[facility] EMERGENCY DEPARTMENT Provider Note   CSN: [id number] Arrival date & time: 01/02/20  1408     History Chief Complaint  Patient presents with  . Chest Pain    Carla Bowen is a 64 y.o. female.  The history is provided by the patient and medical records. No language interpreter was used.  Chest Pain    64 year old female significant history of hypertension, hypercholesterolemia, fibromyalgias, prior stroke not on any anticoagulants anxiety, depression, presenting complaint of concerns of high blood pressure.  Patient states for the past 1 to 2 weeks she has had cold symptoms.  Symptoms including headache, sinus congestion, sinus pressure, occasional coughing.  States she was seen and evaluated by her PCP and had negative Covid test.  She was given antibiotic that she took for 5 days and ended approximately a week ago.  She was also on several over-the-counter medication for her cold symptoms but has stopped taking it for the past 5 days.  She is here due to concerns of recurrent headache throbbing sensation to the left side of head, pressure in her left arm, occasional vague chest discomfort and shortness of breath as well as noticing that her blood pressure has been in the 200s despite increasing her blood pressure medication at home.  States she has prior stroke involving her left side since 2014.  She does not complain of fever, neck stiffness, nausea or vomiting, exertional chest pain, or rash.  Denies confusion.  Past Medical History:  Diagnosis Date  . Anxiety   . Aortic atherosclerosis (HCC) 09/24/2017  . Depression   . Fibromyalgia   . High cholesterol   . Hypertension   . Stroke [facility])     Patient Active Problem List   Diagnosis Date Noted  . Viral upper respiratory infection 12/21/2019  . COPD exacerbation (HCC) 12/21/2019  . Hypoxemia   . Tracheostomy status (HCC)   . Alcohol abuse with intoxication (HCC) 09/24/2017  . Alcohol withdrawal  syndrome with complication, with unspecified complication (HCC) 09/24/2017  . Macrocytic anemia 09/24/2017  . Aortic atherosclerosis (HCC) 09/24/2017  . Tracheostomy care (HCC) 09/23/2017  . Tracheostomy in place [facility]) 09/23/2017  . Acute encephalopathy 09/23/2017  . MSSA (methicillin susceptible Staphylococcus aureus) pneumonia (HCC) 09/23/2017  . Severe protein-calorie malnutrition (HCC) 09/23/2017  . Acute respiratory failure (HCC)   . Acute ischemic colitis (HCC)   . Sepsis (HCC) 09/03/2017  . Alcohol use disorder, severe, dependence (HCC) 06/19/2016  . 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/13/2016  . Chest tightness   . History of stroke   . Left hemiparesis (HCC)   . Stroke (HCC)   . Chest pain 11/03/2014  . UTI (lower urinary tract infection) 11/03/2014  . HTN (hypertension) 11/03/2014  . ICH (intracerebral hemorrhage) (HCC) 05/30/2013  . Spastic hemiplegia affecting nondominant side (HCC) 05/30/2013  . Abnormality of gait 05/30/2013  . 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 Disturbance of skin sensation 05/30/2013    Past Surgical History:  Procedure Laterality Date  . CESAREAN SECTION       OB History    Gravida  1   Para  1   Term  1   Preterm      AB      Living        SAB      TAB      Ectopic      Multiple      Live Births  Family History  Problem Relation Age of Onset  . Lung cancer Father   . Diabetes Brother   . Heart attack Brother   . Hypertension Brother   . Hypertension Sister     Social History   Tobacco Use  . Smoking status: Current Every Day Smoker    Packs/day: 0.50    Years: 20.00    Pack years: 10.00  . Smokeless tobacco: Never Used  Vaping Use  . Vaping Use: Unknown  Substance Use Topics  . Alcohol use: Not Currently    Alcohol/week: 10.0 standard drinks    Types: 5 Cans of beer, 5 Shots of liquor per week    Comment: BAC was 247- last drink 08/2017  . Drug use: No    Home  Medications Prior to Admission medications   Medication Sig Start Date End Date Taking? Authorizing Provider  amLODipine (NORVASC) 10 MG tablet Place 1 tablet (10 mg total) into feeding tube daily. Patient not taking: Reported on 08/02/2019 09/26/17   Standley Brooking, MD  ARIPiprazole (ABILIFY) 5 MG tablet Place 1 tablet (5 mg total) into feeding tube daily. Patient not taking: Reported on 08/02/2019 09/26/17   Standley Brooking, MD  aspirin EC 81 MG tablet Take 81 mg by mouth daily.    [provider]  buPROPion (WELLBUTRIN) 100 MG tablet Place 0.5 tablets (50 mg total) into feeding tube 2 (two) times daily. Patient not taking: Reported on 12/07/2018 09/25/17   Standley Brooking, MD  Cholecalciferol (VITAMIN D3) 50 MCG (2000 UT) TABS Take by mouth.    [provider]  cyclobenzaprine (FLEXERIL) 10 MG tablet Take 10 mg by mouth 3 (three) times daily as needed. 11/26/18   [provider]  diltiazem (CARDIZEM) 10 mg/ml oral suspension Place 6 mLs (60 mg total) into feeding tube every 6 (six) hours. Patient not taking: Reported on 08/02/2019 09/25/17   Standley Brooking, MD  diltiazem (CARDIZEM) 60 MG tablet Take 60 mg by mouth 2 (two) times daily.    [provider]  famotidine (PEPCID) 40 MG/5ML suspension Place 2.5 mLs (20 mg total) into feeding tube every 12 (twelve) hours. Patient not taking: Reported on 12/07/2018 09/25/17   Standley Brooking, MD  fluticasone [facility]) 50 MCG/ACT nasal spray Place into both nostrils daily.    [provider]  folic acid (FOLVITE) 1 MG tablet Place 1 tablet (1 mg total) into feeding tube daily. Patient not taking: Reported on 12/07/2018 09/26/17   Standley Brooking, MD  gabapentin (NEURONTIN) 100 MG capsule Take 100 mg by mouth 2 (two) times daily.    [provider]  gabapentin (NEURONTIN) 250 MG/5ML solution Place 2 mLs (100 mg total) into feeding tube at bedtime. Patient not taking: Reported on 08/02/2019  09/25/17   Standley Brooking, MD  gabapentin (NEURONTIN) 600 MG tablet Take 600 mg by mouth. 1 tablet by mouth at night    [provider]  ibuprofen (ADVIL) 800 MG tablet TAKE 1 (ONE) TABLET PER MOUTH THREE TIMES A DAY 11/21/18   [provider]  lamoTRIgine (LAMICTAL) 25 MG tablet Place 1 tablet (25 mg total) into feeding tube 2 (two) times daily. Patient not taking: Reported on 12/07/2018 09/25/17   Standley Brooking, MD  levocetirizine Elita Boone) 5 MG tablet  12/05/18   [provider]  losartan (COZAAR) 50 MG tablet Take 50 mg by mouth daily.    [provider]  metoprolol tartrate (LOPRESSOR) 25 MG tablet Take  25 mg by mouth 3 (three) times daily.    [provider]  metoprolol tartrate (LOPRESSOR) 25 mg/10 mL SUSP Place 10 mLs (25 mg total) into feeding tube 2 (two) times daily. Patient not taking: Reported on 08/02/2019 09/25/17   Standley Brooking, MD  Multiple Vitamin (MULTIVITAMIN) LIQD Place 15 mLs into feeding tube daily. Patient not taking: Reported on 12/07/2018 09/26/17   Standley Brooking, MD  Nutritional Supplements (FEEDING SUPPLEMENT, VITAL AF 1.2 CAL,) LIQD Place 1,500 mLs into feeding tube continuous. Patient not taking: Reported on 12/07/2018 09/25/17   Standley Brooking, MD  omeprazole (PRILOSEC) 40 MG capsule TAKE 1 CAPSULE IN THE MORNING AND 1 CAPSULE AT NIGHT 10/23/18   [provider]  QUEtiapine (SEROQUEL) 100 MG tablet Place 1 tablet (100 mg total) into feeding tube 2 (two) times daily. Patient not taking: Reported on 12/07/2018 09/25/17   Standley Brooking, MD  rosuvastatin (CRESTOR) 10 MG tablet Place 1 tablet (10 mg total) into feeding tube at bedtime. Patient not taking: Reported on 08/02/2019 09/25/17   Standley Brooking, MD  thiamine 100 MG tablet Place 1 tablet (100 mg total) into feeding tube daily. Patient not taking: Reported on 12/07/2018 09/26/17   Standley Brooking, MD  tizanidine (ZANAFLEX) 2 MG capsule Take 2 mg  by mouth 3 (three) times daily.    Dois Davenport, MD  valACYclovir (VALTREX) 500 MG tablet Place 1 tablet (500 mg total) into feeding tube 2 (two) times daily. Patient not taking: Reported on 12/07/2018 09/25/17   Standley Brooking, MD    Allergies    Ciprofloxacin, Codeine, Sulfa antibiotics, and Tramadol  Review of Systems   Review of Systems  Cardiovascular: Positive for chest pain.  All other systems reviewed and are negative.   Physical Exam Updated Vital Signs BP 118/79   Pulse (!) 106   Temp 98.4 F (36.9 C) (Oral)   Resp (!) 28   Ht 5\' 1"  (1.549 m)   Wt 53.1 kg   LMP  (LMP Unknown)   SpO2 100%   BMI 22.12 kg/m   Physical Exam Vitals and nursing note reviewed.  Constitutional:      General: She is not in acute distress.    Appearance: She is well-developed.  HENT:     Head: Atraumatic.  Eyes:     Conjunctiva/sclera: Conjunctivae normal.  Cardiovascular:     Rate and Rhythm: Normal rate and regular rhythm.     Heart sounds: Normal heart sounds.  Pulmonary:     Effort: Pulmonary effort is normal.     Breath sounds: Normal breath sounds. No wheezing, rhonchi or rales.  Abdominal:     Palpations: Abdomen is soft.     Tenderness: There is no abdominal tenderness.  Musculoskeletal:     Cervical back: Neck supple.     Comments: 5 out of 5 strength to right upper extremity, 4/5 strength to left upper extremity.  Normal grip strength with spasm to left arm with exertion.  Skin:    Findings: No rash.  Neurological:     Mental Status: She is alert and oriented to person, place, and time.     GCS: GCS eye subscore is 4. GCS verbal subscore is 5. GCS motor subscore is 6.     Cranial Nerves: Cranial nerves are intact.     Sensory: Sensation is intact.     ED Results / Procedures / Treatments   Labs (all labs ordered are listed, but  only abnormal results are displayed) Labs Reviewed  BASIC METABOLIC PANEL - Abnormal; Notable for the following components:       Result Value   Glucose, Bld 114 (*)    All other components within normal limits  CBC - Abnormal; Notable for the following components:   Hemoglobin 15.1 (*)    HCT 47.2 (*)    All other components within normal limits  TROPONIN I (HIGH SENSITIVITY)  TROPONIN I (HIGH SENSITIVITY)    EKG EKG Interpretation  Date/Time:  Thursday January 02 2020 15:57:38 EDT Ventricular Rate:  80 PR Interval:    QRS Duration: 86 QT Interval:  389 QTC Calculation: 449 R Axis:   28 Text Interpretation: Sinus rhythm Probable left atrial enlargement No significant change since last tracing Confirmed by Jacalyn LefevreHaviland, Julie [phone]([id number]) on 01/02/2020 4:05:44 PM   Radiology DG Chest 2 View  Result Date: 01/02/2020 CLINICAL DATA:  Chest pain. EXAM: CHEST - 2 VIEW COMPARISON:  12/21/2019 FINDINGS: The heart size and mediastinal contours are within normal limits. Both lungs are clear. No pleural effusions or pneumothorax. The visualized skeletal structures are unremarkable. IMPRESSION: No active cardiopulmonary disease. Electronically Signed   By: Feliberto HartsFrederick S Jones MD   On: 01/02/2020 14:49    Procedures Procedures (including critical care time)  Medications Ordered in ED Medications  amLODipine (NORVASC) tablet 5 mg (5 mg Oral Given 01/02/20 1630)    ED Course  I have reviewed the triage vital signs and the nursing notes.  Pertinent labs & imaging results that were available during my care of the patient were reviewed by me and considered in my medical decision making (see chart for details).    MDM Rules/Calculators/A&P                          BP (!) 174/93   Pulse 87   Temp 98.4 F (36.9 C) (Oral)   Resp 20   Ht 5\' 1"  (1.549 m)   Wt 53.1 kg   LMP  (LMP Unknown)   SpO2 100%   BMI 22.12 kg/m   Final Clinical Impression(s) / ED Diagnoses Final diagnoses:  Hypertension, unspecified type  Frontal sinus pain    Rx / DC Orders ED Discharge Orders         Ordered    meclizine (ANTIVERT) 25  MG tablet  3 times daily PRN        01/02/20 1735         4:24 PM Patient here with concerns of high blood pressure for the past several days as has 200 systolics.  She mentioned having cold symptoms for the past few weeks was treated with antibiotic and had negative Covid test.  Her symptoms do persist.  She has left-sided deficits secondary to prior strokes in 2014.  She report noticing increased left-sided tightness along with a headache which concerns her.  She is on aspirin but not on any other anticoagulant.  Suspect her left-sided weakness is likely recrudescence of her stroke possibly due to the underlying stress of her overall infection.  States she has had a negative Covid test.  Does not complain of any significant shortness of breath or cough to suggest pneumonia.  Blood pressure is 195 systolic.  Will give amlodipine.   Will obtain head CT scan.  5:30 PM Labs are reassuring, normal troponin, chest x-ray unremarkable, EKG is reassuring, patient however refused head CT scan.  She understands that we cannot rule out potential  new stroke without additional work-up.  Patient understands to return promptly if her symptoms worsen.  She will follow-up closely with her doctor.   Fayrene Helper, PA-C 01/02/20 1737    Tegeler, Canary Brim, MD 01/02/20 2322

## 2020-01-16 ENCOUNTER — Ambulatory Visit: Payer: Medicare Other | Admitting: Cardiology

## 2020-01-16 ENCOUNTER — Other Ambulatory Visit: Payer: Self-pay

## 2020-01-16 ENCOUNTER — Encounter: Payer: Self-pay | Admitting: Cardiology

## 2020-01-16 VITALS — BP 114/74 | HR 87 | Ht 61.0 in | Wt 122.0 lb

## 2020-01-16 DIAGNOSIS — I10 Essential (primary) hypertension: Secondary | ICD-10-CM

## 2020-01-16 DIAGNOSIS — R072 Precordial pain: Secondary | ICD-10-CM

## 2020-01-16 DIAGNOSIS — F17200 Nicotine dependence, unspecified, uncomplicated: Secondary | ICD-10-CM

## 2020-01-16 DIAGNOSIS — R42 Dizziness and giddiness: Secondary | ICD-10-CM

## 2020-01-16 DIAGNOSIS — E782 Mixed hyperlipidemia: Secondary | ICD-10-CM

## 2020-01-16 DIAGNOSIS — Z8673 Personal history of transient ischemic attack (TIA), and cerebral infarction without residual deficits: Secondary | ICD-10-CM

## 2020-01-16 DIAGNOSIS — J449 Chronic obstructive pulmonary disease, unspecified: Secondary | ICD-10-CM | POA: Insufficient documentation

## 2020-01-16 NOTE — Progress Notes (Signed)
ID:  Carla Bowen, DOB 01/31/1956, MRN [id number]  PCP:  Hayden Rasmussen, MD  Cardiologist:  Rex Kras, DO, [facility] (established care 01/16/2020)  REASON FOR CONSULT: HTN and Chest Pain.   REQUESTING PHYSICIAN:  Hayden Rasmussen, MD [address],  [place] [address]  Chief Complaint  Patient presents with  . Chest Pain  . New Patient (Initial Visit)  . Hypertension    HPI  Carla Bowen is a 64 y.o. 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  She is referred to the office at the request of Hayden Rasmussen, MD for evaluation of hypertension and chest pain.  Hypertension: Patient states that she has had a hemorrhagic stroke in the past due to elevated blood pressures.  Despite that her home blood pressures have been elevated and has gone to the hospital multiple times for symptoms of chest pain and uncontrolled hypertension.  Patient states that usually her systolic blood pressure is around 180 mmHg and the diastolic blood pressure ranges between 87-100 mmHg.  Recently her antihypertensive medication were changed by her PCP and ED physician and they have improved.  Her office blood pressure today is 809 mmHg systolic.  She consumes a high salt diet with canned foods at least once a week and lean cuisine's at least 3 times a week.  Chest pain: Patient thinks that she has been having substernal chest pain, 8 out of 10, tightness/squeezing-like sensation, worsens with stressful situations or anxiety, not brought on by effort related activities and it does improve with rest.  Of note, patient is not very physically active on a regular basis due to history of  stroke and back pain.  Patient states that the pain is usually present intermittently but when it occurs it lasts for several hours in duration.  Associated symptoms include difficulty in breathing, left arm pain, nausea.  FUNCTIONAL STATUS: Walks 10 minutes outside per day, physical activity limited due to back pain.    ALLERGIES: Allergies  Allergen Reactions  . Ciprofloxacin Itching, Nausea And Vomiting and Rash  . Codeine Hives  . Sulfa Antibiotics Hives  . Tramadol Hives    MEDICATION LIST PRIOR TO VISIT: Current Meds  Medication Sig  . amLODipine (NORVASC) 5 MG tablet Take 5 mg by mouth daily.  Marland Kitchen aspirin EC 81 MG tablet Take 81 mg by mouth daily.  . baclofen (LIORESAL) 10 MG tablet Take 10 mg by mouth 3 (three) times daily as needed.  . Cholecalciferol (VITAMIN D3) 50 MCG (2000 UT) TABS Take by mouth.  . famotidine (PEPCID) 40 MG/5ML suspension Place 2.5 mLs (20 mg total) into feeding tube every 12 (twelve) hours.  . fluticasone (FLONASE) 50 MCG/ACT nasal spray Place 1-2 sprays into both nostrils daily.  . Fluticasone-Umeclidin-Vilant (TRELEGY ELLIPTA) 100-62.5-25 MCG/INH AEPB Inhale into the lungs.  . gabapentin (NEURONTIN) 100 MG capsule Take 100 mg by mouth 2 (two) times daily.  Marland Kitchen gabapentin (NEURONTIN) 600 MG tablet Take 600 mg by mouth at bedtime.  Marland Kitchen levocetirizine (XYZAL) 5 MG tablet   . losartan (COZAAR) 100 MG tablet Take 100 mg by mouth daily.  . meclizine (ANTIVERT) 25 MG tablet Take 1 tablet (25 mg total) by mouth 3 (  three) times daily as needed for dizziness.  . metoprolol succinate (TOPROL-XL) 50 MG 24 hr tablet Take 75 mg by mouth daily.  . rosuvastatin (CRESTOR) 40 MG tablet Take 40 mg by mouth at bedtime.  . vitamin B-12 (CYANOCOBALAMIN) 1000 MCG tablet Take 1,000 mcg by mouth daily.  . [DISCONTINUED] gabapentin (NEURONTIN) 100 MG capsule Take 100 mg by mouth in the morning and at bedtime. Takes/ 600 mg at night  . [DISCONTINUED] rosuvastatin (CRESTOR) 10 MG  tablet Place 1 tablet (10 mg total) into feeding tube at bedtime.     PAST MEDICAL HISTORY: Past Medical History:  Diagnosis Date  . Anxiety   . Aortic atherosclerosis ([place]) 09/24/2017  . COPD (chronic obstructive pulmonary disease) ([place])   . Depression   . Fibromyalgia   . High cholesterol   . Hypertension   . Stroke [facility])     PAST SURGICAL HISTORY: Past Surgical History:  Procedure Laterality Date  . CESAREAN SECTION    . TRACHEOSTOMY     Hx of tracheostomy    FAMILY HISTORY: The patient family history includes Aneurysm in her mother; Diabetes in her brother; Heart attack in her brother; Hypertension in her brother and sister; Lung cancer in her father.  SOCIAL HISTORY:  The patient  reports that she has been smoking. She has a 10.00 pack-year smoking history. She has never used smokeless tobacco. She reports previous alcohol use of about 10.0 standard drinks of alcohol per week. She reports that she does not use drugs.  REVIEW OF SYSTEMS: Review of Systems  Constitutional: Negative for chills and fever.  HENT: Negative for hoarse voice and nosebleeds.   Eyes: Negative for discharge, double vision and pain.  Cardiovascular: Positive for chest pain and dyspnea on exertion. Negative for claudication, leg swelling, near-syncope, orthopnea, palpitations, paroxysmal nocturnal dyspnea and syncope.  Respiratory: Positive for snoring and sputum production. Negative for hemoptysis and shortness of breath.   Musculoskeletal: Negative for muscle cramps and myalgias.  Gastrointestinal: Negative for abdominal pain, constipation, diarrhea, hematemesis, hematochezia, melena, nausea and vomiting.  Neurological: Positive for dizziness (changing position and bending forward). Negative for light-headedness.    PHYSICAL EXAM: Vitals with BMI 01/16/2020 01/02/2020 01/02/2020  Height $Remov'5\' 1"'MsupqM$  - -  Weight 122 lbs - -  BMI 21.97 - -  Systolic 588 325 498  Diastolic 74 81 93  Pulse 87 71 -    Orthostatic VS for the past 72 hrs (Last 3 readings):  Orthostatic BP Patient Position BP Location Cuff Size Orthostatic Pulse  01/16/20 1415 103/66 Standing Right Arm Normal 83  01/16/20 1414 102/64 Sitting Right Arm Normal 81  01/16/20 1413 113/66 Supine Right Arm -- 75  01/16/20 1300 -- Sitting Left Arm Normal --   CONSTITUTIONAL: Well-developed and well-nourished. No acute distress.  SKIN: Skin is warm and dry. No rash noted. No cyanosis. No pallor. No jaundice HEAD: Normocephalic and atraumatic.  EYES: No scleral icterus MOUTH/THROAT: Moist oral membranes.  NECK: No JVD present. No thyromegaly noted.  Soft right carotid bruit.  Tracheostomy site is well-healed LYMPHATIC: No visible cervical adenopathy.  CHEST Normal respiratory effort. No intercostal retractions  LUNGS: Clear to auscultation bilaterally.  No stridor. No wheezes. No rales.  CARDIOVASCULAR: Regular rate and rhythm, positive S1-S2, no murmurs rubs or gallops appreciated ABDOMINAL: Soft, nontender, nondistended, positive bowel sounds all 4 quadrants, no apparent ascites.  EXTREMITIES: No peripheral edema, 2+ posterior tibial and dorsalis pedis pulses bilaterally HEMATOLOGIC: No significant bruising NEUROLOGIC: Oriented to person, place,  and time. Nonfocal.  Spasticity noted of the left upper extremity. PSYCHIATRIC: Normal mood and affect. Normal behavior. Cooperative  CARDIAC DATABASE: EKG: 01/16/2020: Normal sinus rhythm, 84 bpm, normal axis, subtle ST depressions in leads V3 and V4 without reciprocal changes, without underlying injury pattern.  Echocardiogram: 09/04/2017:  Left ventricle: The cavity size was normal. Systolic function was normal. The estimated ejection fraction was in the range of 55% to 60%. Although no diagnostic regional wall motion abnormality was identified, this possibility cannot be completely excluded on the basis of this study. Left ventricular diastolic function parameters were normal.    Stress Testing: None  Heart Catheterization: None   LABORATORY DATA: CBC Latest Ref Rng & Units 01/02/2020 12/21/2019 09/25/2017  WBC 4.0 - 10.5 K/uL 7.7 8.8 14.1(H)  Hemoglobin 12.0 - 15.0 g/dL 15.1(H) 15.9(H) 9.8(L)  Hematocrit 36 - 46 % 47.2(H) 49.5(H) 31.1(L)  Platelets 150 - 400 K/uL 271 365 498(H)    CMP Latest Ref Rng & Units 01/02/2020 12/21/2019 09/25/2017  Glucose 70 - 99 mg/dL 114(H) 140(H) 177(H)  BUN 8 - 23 mg/dL 15 21 26(H)  Creatinine 0.44 - 1.00 mg/dL 0.60 0.81 0.60  Sodium 135 - 145 mmol/L 142 143 139  Potassium 3.5 - 5.1 mmol/L 4.0 3.4(L) 3.5  Chloride 98 - 111 mmol/L 110 106 102  CO2 22 - 32 mmol/L _0 Calcium 8.9 - 10.3 mg/dL 9.1 9.7 9.5  Total Protein 6.5 - 8.1 g/dL - - -  Total Bilirubin 0.3 - 1.2 mg/dL - - -  Alkaline Phos 38 - 126 U/L - - -  AST 15 - 41 U/L - - -  ALT 0 - 44 U/L - - -    Lipid Panel     Component Value Date/Time   CHOL 262 (H) 11/03/2014 2055   TRIG 910 (H) 09/13/2017 0440   HDL 82 11/03/2014 2055   CHOLHDL 3.2 11/03/2014 2055   VLDL 25 11/03/2014 2055   LDLCALC 155 (H) 11/03/2014 2055    No components found for: NTPROBNP No results for input(s): PROBNP in the last 8760 hours. No results for input(s): TSH in the last 8760 hours.  BMP Recent Labs    12/21/19 1629 01/02/20 1427  NA 143 142  K 3.4* 4.0  CL 106 110  CO2 22 23  GLUCOSE 140* 114*  BUN 21 15  CREATININE 0.81 0.60  CALCIUM 9.7 9.1  GFRNONAA >60 >60    HEMOGLOBIN A1C Lab Results  Component Value Date   HGBA1C 5.5 11/03/2014   MPG 111 11/03/2014   External Labs: Collected: 10/24/2019 Creatinine 0.68 mg/dL. eGFR: 94 mL/min per 1.73 m Lipid profile: Total cholesterol 164, triglycerides 232,  HDL 54, LDL 64, non-HDL 110 Hemoglobin A1c: 5.7 TSH: 0.16   IMPRESSION:    ICD-10-CM   1. Precordial pain  R07.2 EKG 12-Lead    PCV ECHOCARDIOGRAM COMPLETE    PCV MYOCARDIAL PERFUSION WITH LEXISCAN  2. Primary hypertension  I10   3. History of  stroke  Z86.73 PCV CAROTID DUPLEX (BILATERAL)  4. Mixed hyperlipidemia  E78.2   5. Smoking  F17.200   6. Chronic obstructive pulmonary disease, unspecified COPD type ([place])  J44.9 EKG 12-Lead  7. Dizziness  R42 PCV CAROTID DUPLEX (BILATERAL)     RECOMMENDATIONS: ADAMARIZ GILLOTT is a 64 y.o. 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  Precordial chest pain:  Patient symptoms of precordial chest pain appear to be typical in nature.  EKG shows normal sinus rhythm with subtle ST depression V3 V4 without reciprocal changes.  She has multiple cardiovascular risk factors as outlined above.  Therefore an ischemic evaluation is warranted.  Echocardiogram will be ordered to evaluate for structural heart disease and left ventricular systolic function.    Nuclear stress test recommended to evaluate for reversible ischemia.  Patient is unable to exercise on a treadmill due to back pain.  Dizziness:  Given her history of CVA, active tobacco use, hyperlipidemia recommended checking carotid duplex to evaluate for carotid artery atherosclerosis.  Orthostatic vital signs negative  Benign essential hypertension:  Patient referred here for management of her hypertension.  Patient states that she recently had medications titrated by her PCP and ED physician.  Since then her blood pressures have improved.  Her office blood pressure is very well controlled.  Not recommending any additional changes at this time.  Active tobacco use: - Currently smoking 0.5 packs/day   - Patient was informed of the dangers of tobacco abuse including stroke, cancer, and MI, as well as benefits of tobacco cessation. - Patient is not willing to quit at this time. - Approximately 7 mins were spent counseling patient cessation  techniques. We discussed various methods to help quit smoking, including deciding on a date to quit, joining a support group, pharmacological agents- nicotine gum/patch/lozenges, chantix.  - I will reassess her progress at the next follow-up visit  Independently reviewed outside labs from PCPs office.  And outside the TSH is low.  Patient is asked to follow-up with her PCP regarding her thyroid management.   FINAL MEDICATION LIST END OF ENCOUNTER: No orders of the defined types were placed in this encounter.    Current Outpatient Medications:  .  amLODipine (NORVASC) 5 MG tablet, Take 5 mg by mouth daily., Disp: , Rfl:  .  aspirin EC 81 MG tablet, Take 81 mg by mouth daily., Disp: , Rfl:  .  baclofen (LIORESAL) 10 MG tablet, Take 10 mg by mouth 3 (three) times daily as needed., Disp: , Rfl:  .  Cholecalciferol (VITAMIN D3) 50 MCG (2000 UT) TABS, Take by mouth., Disp: , Rfl:  .  famotidine (PEPCID) 40 MG/5ML suspension, Place 2.5 mLs (20 mg total) into feeding tube every 12 (twelve) hours., Disp: , Rfl:  .  fluticasone (FLONASE) 50 MCG/ACT nasal spray, Place 1-2 sprays into both nostrils daily., Disp: , Rfl:  .  Fluticasone-Umeclidin-Vilant (TRELEGY ELLIPTA) 100-62.5-25 MCG/INH AEPB, Inhale into the lungs., Disp: , Rfl:  .  gabapentin (NEURONTIN) 100 MG capsule, Take 100 mg by mouth 2 (two) times daily., Disp: , Rfl:  .  gabapentin (NEURONTIN) 600 MG tablet, Take 600 mg by mouth at bedtime., Disp: , Rfl:  .  levocetirizine (XYZAL) 5 MG tablet, , Disp: , Rfl:  .  losartan (COZAAR) 100 MG tablet, Take 100 mg by mouth daily., Disp: , Rfl:  .  meclizine (ANTIVERT) 25 MG tablet, Take 1 tablet (25 mg total) by mouth 3 (three) times daily as needed for dizziness., Disp: 30 tablet, Rfl: 0 .  metoprolol succinate (TOPROL-XL) 50 MG 24 hr tablet, Take 75 mg by mouth daily., Disp: , Rfl:  .  rosuvastatin (CRESTOR) 40 MG tablet, Take 40 mg by mouth at bedtime., Disp: , Rfl:  .  vitamin B-12  (CYANOCOBALAMIN) 1000 MCG tablet, Take 1,000 mcg by mouth daily., Disp: ,  Rfl:   Orders Placed This Encounter  Procedures  . PCV MYOCARDIAL PERFUSION WITH LEXISCAN  . EKG 12-Lead  . PCV ECHOCARDIOGRAM COMPLETE  . PCV CAROTID DUPLEX (BILATERAL)    There are no Patient Instructions on file for this visit.   --Continue cardiac medications as reconciled in final medication list. --Return in about 4 weeks (around 02/13/2020) for Reevaluation of, Chest pain, Review test results. Or sooner if needed. --Continue follow-up with your primary care physician regarding the management of your other chronic comorbid conditions.  Patient's questions and concerns were addressed to her satisfaction. She voices understanding of the instructions provided during this encounter.   This note was created using a voice recognition software as a result there may be grammatical errors inadvertently enclosed that do not reflect the nature of this encounter. Every attempt is made to correct such errors.  Total time spent: 45 minutes discussing history of present illness, interpretation of EKG, reviewing outside records, independently reviewing outside blood work, reviewing old echo reports, discussing disease management.  Rex Kras, [place], [facility]  Pager: [phone] Office: [phone]

## 2020-01-22 ENCOUNTER — Other Ambulatory Visit: Payer: Self-pay | Admitting: Family Medicine

## 2020-01-22 ENCOUNTER — Other Ambulatory Visit: Payer: Medicare Other

## 2020-01-22 DIAGNOSIS — E038 Other specified hypothyroidism: Secondary | ICD-10-CM

## 2020-01-28 ENCOUNTER — Other Ambulatory Visit: Payer: Medicare Other

## 2020-01-29 ENCOUNTER — Other Ambulatory Visit: Payer: Medicare Other

## 2020-01-31 ENCOUNTER — Ambulatory Visit
Admission: RE | Admit: 2020-01-31 | Discharge: 2020-01-31 | Disposition: A | Discharge status: Home / Self Care | Payer: Medicare Other | Source: Ambulatory Visit | Attending: Family Medicine | Admitting: Family Medicine

## 2020-01-31 DIAGNOSIS — E038 Other specified hypothyroidism: Secondary | ICD-10-CM

## 2020-02-13 ENCOUNTER — Ambulatory Visit: Payer: Medicare Other | Admitting: Cardiology

## 2020-02-19 ENCOUNTER — Telehealth: Payer: Self-pay | Admitting: Physical Medicine and Rehabilitation

## 2020-02-19 NOTE — Telephone Encounter (Signed)
Left message #1 to schedule appointment per referral from [facility].

## 2020-02-28 NOTE — Telephone Encounter (Signed)
Scheduled for OV on 1/12 at 1000.

## 2020-03-25 ENCOUNTER — Ambulatory Visit (INDEPENDENT_AMBULATORY_CARE_PROVIDER_SITE_OTHER): Payer: Medicare Other | Admitting: Physical Medicine and Rehabilitation

## 2020-03-25 ENCOUNTER — Encounter: Payer: Self-pay | Admitting: Physical Medicine and Rehabilitation

## 2020-03-25 ENCOUNTER — Other Ambulatory Visit: Payer: Self-pay

## 2020-03-25 VITALS — BP 186/106 | HR 71

## 2020-03-25 DIAGNOSIS — M25512 Pain in left shoulder: Secondary | ICD-10-CM

## 2020-03-25 DIAGNOSIS — G8110 Spastic hemiplegia affecting unspecified side: Secondary | ICD-10-CM

## 2020-03-25 DIAGNOSIS — M5416 Radiculopathy, lumbar region: Secondary | ICD-10-CM | POA: Diagnosis not present

## 2020-03-25 DIAGNOSIS — R202 Paresthesia of skin: Secondary | ICD-10-CM

## 2020-03-25 DIAGNOSIS — M47816 Spondylosis without myelopathy or radiculopathy, lumbar region: Secondary | ICD-10-CM

## 2020-03-25 DIAGNOSIS — M797 Fibromyalgia: Secondary | ICD-10-CM | POA: Diagnosis not present

## 2020-03-25 DIAGNOSIS — G8929 Other chronic pain: Secondary | ICD-10-CM

## 2020-03-25 DIAGNOSIS — M25552 Pain in left hip: Secondary | ICD-10-CM | POA: Diagnosis not present

## 2020-03-25 DIAGNOSIS — G890 Central pain syndrome: Secondary | ICD-10-CM

## 2020-03-25 NOTE — Progress Notes (Signed)
  Stroke in 2014 affecting entire left side. Spasticity in left arm and left leg. Patient states that she was referred by someone from [facility] [employer] to see if she would be a good candidate for spinal cord stimulator.  Numeric Pain Rating Scale and Functional Assessment Average Pain 7 Pain Right Now 5 My pain is constant, sharp, stabbing, tingling and aching Pain is worse with: walking, bending, standing and some activites Pain improves with: rest, heat/ice, medication and injections   In the last MONTH (on 0-10 scale) has pain interfered with the following?  1. General activity like being  able to carry out your everyday physical activities such as walking, climbing stairs, carrying groceries, or moving a chair?  Rating(10)  2. Relation with others like being able to carry out your usual social activities and roles such as  activities at home, at work and in your community. Rating(10)  3. Enjoyment of life such that you have  been bothered by emotional problems such as feeling anxious, depressed or irritable?  Rating(10)

## 2020-04-30 ENCOUNTER — Encounter: Payer: Self-pay | Admitting: Physical Medicine and Rehabilitation

## 2020-04-30 NOTE — Progress Notes (Signed)
Carla Bowen - 65 y.o. female MRN [id number]  Date of birth: Apr 25, 1955  Office Visit Note: Visit Date: 03/25/2020 PCP: Dois Davenport, MD Referred by: Dois Davenport, MD  Subjective: Chief Complaint  Patient presents with  . Left Arm - Numbness, Weakness, Other  . Left Leg - Numbness, Other, Weakness   HPI: Carla Bowen is a 65 y.o. female who comes in today For new patient consultation and evaluation and management at the request of Dr. Nadyne Coombes from [facility].  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  She complains of chronic left shoulder pain and low back pain with increased spasticity of left arm and left leg.  She gets paresthesias and weakness in the left arm and leg as well.  She does report a lot of shoulder pain.  Her biggest complaint has been really axial back pain worse with standing and ambulating.  She has had no new focal weakness since the stroke.  Her case is further complicated by history of fibromyalgia.  Today her blood pressure was quite high as well and she has several medical issues going on.  She does have close follow-up with her family practice physician.  She takes gabapentin and baclofen.  The gabapentin she takes more at night and then lesser during the day because of sedation.  The baclofen helps the spasticity to some degree.  She has had continued follow-up with neurology for her stroke.  In terms of her back pain this is been a chronic issue that predates the stroke.  She reports history of pain management approaches with injections.  This was performed by Dr. Jordan Likes.  We did have his notes to review.  She reports really good relief with diagnostic medial branch blocks of the lower lumbar spine.  She reports that she was unable to do the radiofrequency ablation because of the cost that she was going to incur if  she just does not have that kind of money.  She has since not been seen by Dr. Jordan Likes.  In the interim she had contacted [employer] for information about a spinal cord stimulator and stimulator trial.  They gave her my name in the area and she is here for referral for looking at potential for spinal cord stimulator trial.  She has had all manner of conservative care and physical therapy and all the complications listed above.  She is not currently on anticoagulation.  She has had no lumbar spine surgery.  She has had MRI of the lumbar spine and this is reviewed with her today with degenerative facet joint arthritic changes and lateral recess narrowing but no high-grade stenosis or nerve compression.  Review of Systems  Musculoskeletal: Positive for back pain.  Neurological: Positive for tingling and weakness.  All other systems reviewed and are negative.  Otherwise per HPI.  Assessment & Plan: Visit Diagnoses:    ICD-10-CM   1. Lumbar radiculopathy  M54.16 Ambulatory referral to Neuropsychology  2. Spondylosis without myelopathy or radiculopathy, lumbar region  M47.816   3. Pain in left hip  M25.552   4. Fibromyalgia  M79.7   5. 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 Spastic hemiplegia affecting nondominant side (HCC)  G81.10 Ambulatory referral to Neuropsychology  7. Chronic left shoulder pain  M25.512    G89.29   8. Paresthesia of skin  R20.2  Plan: Findings:  Chronic history of recalcitrant back pain that predates intracerebral hemorrhage but now with central pain syndrome and thalamic pain syndrome more on the nondominant left side where she does have hemiparesis.  She has some shoulder subluxation on the left I think giving her some impingement of the shoulder.  Her main focus is been her back pain and it has been for some time.  She has had physical therapy and conservative management with medications that she has had injections she did well  with medial branch blocks but did not proceed with radiofrequency ablation due to cost.  Comes in today for spinal cord stimulator trial information and consultation.  I think she represents at least a reasonable candidate for spinal cord stimulator trial to try to help with her low back pain.  We talked about fibromyalgia and the relationship there with increased sensations and I talked about spinal cord stimulator trials in relation to central pain syndrome.  Nonetheless with the trial at least we could see how much it would help.  Unfortunately 1 barriers probably going to be the cost as well.  We do the trial in-house in the office so at least from a trial standpoint it is probably not as much as doing the ablation procedure at a surgery center which is what Dr. Jordan LikesSpivey does.  Nonetheless if it went to implantation that probably would incur cost that she is probably not going to want to proceed with.  Nonetheless she does want to proceed and see about getting approval.  We will refer her to neuropsychological evaluation which is standard for prespinal cord stim trial for approval.  Otherwise this is a procedure of last resort and I do think she would be a decent candidate from a back pain standpoint.    Meds & Orders: No orders of the defined types were placed in this encounter.   Orders Placed This Encounter  Procedures  . Ambulatory referral to Neuropsychology    Follow-up: No follow-ups on file.   Procedures: No procedures performed      Clinical History: MRI LUMBAR SPINE WITHOUT CONTRAST  TECHNIQUE: Multiplanar, multisequence MR imaging of the lumbar spine was performed. No intravenous contrast was administered.  COMPARISON:  MRI lumbar spine dated April 29, 2017.  FINDINGS: Segmentation:  Standard.  Alignment:  Physiologic.  Vertebrae: No acute fracture, evidence of discitis, or focal bone lesion. New T11 superior endplate Schmorl's node.  Conus medullaris and cauda  equina: Conus extends to the L2 level. Conus and cauda equina appear normal.  Paraspinal and other soft tissues: Negative.  Disc levels:  T11-T12 to L1-L2: Negative.  L2-L3:  Unchanged mild disc bulging. No stenosis.  L3-L4: Unchanged small broad-based posterior disc protrusion. Unchanged moderate right and mild left facet arthropathy. No stenosis.  L4-L5: Unchanged small broad-based posterior disc protrusion eccentric to the left. Unchanged mild bilateral facet arthropathy. Unchanged mild left neuroforaminal stenosis. No spinal canal or right neuroforaminal stenosis.  L5-S1: Unchanged small circumferential disc osteophyte complex and mild bilateral facet arthropathy. Unchanged mild bilateral neuroforaminal stenosis. No spinal canal stenosis.  IMPRESSION: Mild degenerative changes of the lumbar spine as described above, overall similar to prior study. No high-grade stenosis or impingement.   Electronically Signed   By: Obie DredgeWilliam T Derry M.D.   On: 04/19/2019 07:48   She reports that she has been smoking. She has a 10.00 pack-year smoking history. She has never used smokeless tobacco. No results for input(s): HGBA1C, LABURIC in the last 8760 hours.  Objective:  VS:  HT:    WT:   BMI:     BP:(!) 186/106  HR:71bpm  TEMP: ( )  RESP:  Physical Exam Vitals and nursing note reviewed.  Constitutional:      General: She is not in acute distress.    Appearance: Normal appearance. She is not ill-appearing.  HENT:     Head: Normocephalic and atraumatic.     Right Ear: External ear normal.     Left Ear: External ear normal.  Eyes:     Extraocular Movements: Extraocular movements intact.  Cardiovascular:     Rate and Rhythm: Normal rate.     Pulses: Normal pulses.  Pulmonary:     Effort: Pulmonary effort is normal. No respiratory distress.  Abdominal:     General: There is no distension.     Palpations: Abdomen is soft.  Musculoskeletal:        General:  Tenderness present.     Cervical back: Neck supple.     Right lower leg: No edema.     Left lower leg: No edema.     Comments: Examination of the neck and shoulder shows forward flexed cervical spine some pain at end ranges of rotation impingement of the left shoulder with some subluxation.  She does have generalized weakness in the left arm which is nondermatomal and consistent with hemiparesis.  She does have modified Ashworth scale 2 increase spasticity.  Lamination of the lower spine shows pain going from sit to stand in full extension.  She does have tender points and trigger points of the combination of myofascial and fibromyalgia type pain to tenderness.  She has no pain with hip rotation.  Left-sided lower limb generalized weakness she is able to ambulate she does not have foot drop.  Skin:    Findings: No erythema, lesion or rash.  Neurological:     General: No focal deficit present.     Mental Status: She is alert and oriented to person, place, and time.     Sensory: No sensory deficit.     Motor: No weakness or abnormal muscle tone.     Coordination: Coordination normal.  Psychiatric:        Mood and Affect: Mood normal.        Behavior: Behavior normal.     Ortho Exam  Imaging: No results found.  Past Medical/Family/Surgical/Social History: Medications & Allergies reviewed per EMR, new medications updated. Patient Active Problem List   Diagnosis Date Noted  . COPD (chronic obstructive pulmonary disease) (HCC)   . Viral upper respiratory infection 12/21/2019  . COPD exacerbation (HCC) 12/21/2019  . Carpal tunnel syndrome of left wrist 03/27/2019  . Hypoxemia   . Tracheostomy status (HCC)   . Alcohol abuse with intoxication (HCC) 09/24/2017  . Alcohol withdrawal syndrome with complication, with unspecified complication (HCC) 09/24/2017  . Macrocytic anemia 09/24/2017  . Aortic atherosclerosis (HCC) 09/24/2017  . Tracheostomy care (HCC) 09/23/2017  . Tracheostomy in  place [facility]) 09/23/2017  . Acute encephalopathy 09/23/2017  . MSSA (methicillin susceptible Staphylococcus aureus) pneumonia (HCC) 09/23/2017  . Severe protein-calorie malnutrition (HCC) 09/23/2017  . Acute respiratory failure (HCC)   . Acute ischemic colitis (HCC)   . Sepsis (HCC) 09/03/2017  . Alcohol use disorder, severe, dependence (HCC) 06/19/2016  . 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/13/2016  . Chest tightness   . History of stroke   . Left hemiparesis (HCC)   . Stroke (HCC)   . Chest pain 11/03/2014  .  UTI (lower urinary tract infection) 11/03/2014  . HTN (hypertension) 11/03/2014  . Anxiety and depression 07/30/2014  . Muscle spasticity 07/30/2014  . ICH (intracerebral hemorrhage) (HCC) 05/30/2013  . Spastic hemiplegia affecting nondominant side (HCC) 05/30/2013  . Abnormality of gait 05/30/2013  . 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 Disturbance of skin sensation 05/30/2013   Past Medical History:  Diagnosis Date  . Anxiety   . Aortic atherosclerosis (HCC) 09/24/2017  . COPD (chronic obstructive pulmonary disease) (HCC)   . Depression   . Fibromyalgia   . High cholesterol   . Hypertension   . Stroke [facility])    Family History  Problem Relation Age of Onset  . Lung cancer Father   . Aneurysm Mother   . Diabetes Brother   . Heart attack Brother   . Hypertension Brother   . Hypertension Sister    Past Surgical History:  Procedure Laterality Date  . CESAREAN SECTION    . TRACHEOSTOMY     Hx of tracheostomy   Social History   Occupational History  . Occupation: Disablity   Tobacco Use  . Smoking status: Current Every Day Smoker    Packs/day: 0.50    Years: 20.00    Pack years: 10.00  . Smokeless tobacco: Never Used  Vaping Use  . Vaping Use: Never used  Substance and Sexual Activity  . Alcohol use: Not Currently    Alcohol/week: 10.0 standard drinks    Types: 5 Cans of beer, 5 Shots of liquor per week    Comment: BAC was 247- last drink  08/2017  . Drug use: No  . Sexual activity: Not on file

## 2020-05-14 ENCOUNTER — Encounter: Payer: Self-pay | Admitting: Physical Medicine & Rehabilitation

## 2020-06-23 ENCOUNTER — Encounter: Payer: Medicare Other | Admitting: Physical Medicine & Rehabilitation

## 2020-07-27 ENCOUNTER — Other Ambulatory Visit: Payer: Self-pay | Admitting: Family Medicine

## 2020-07-27 DIAGNOSIS — E042 Nontoxic multinodular goiter: Secondary | ICD-10-CM

## 2020-11-25 ENCOUNTER — Encounter: Payer: Self-pay | Admitting: Gastroenterology

## 2020-12-28 ENCOUNTER — Encounter: Payer: Self-pay | Admitting: Gastroenterology

## 2020-12-29 ENCOUNTER — Ambulatory Visit: Payer: Medicare Other | Admitting: Gastroenterology

## 2021-01-04 ENCOUNTER — Encounter: Payer: Self-pay | Admitting: Family Medicine

## 2021-03-17 LAB — COLOGUARD

## 2021-03-25 ENCOUNTER — Other Ambulatory Visit: Payer: Self-pay | Admitting: Family Medicine

## 2021-03-25 DIAGNOSIS — Z1231 Encounter for screening mammogram for malignant neoplasm of breast: Secondary | ICD-10-CM

## 2021-04-28 ENCOUNTER — Telehealth: Payer: Self-pay | Admitting: Physical Medicine and Rehabilitation

## 2021-04-28 NOTE — Telephone Encounter (Signed)
Pt called requesting an appt for neck pain. Please call pt at [phone] about this matter. Last see by Dr. Ernestina Patches 2021.

## 2021-05-06 ENCOUNTER — Other Ambulatory Visit: Payer: Self-pay

## 2021-05-06 ENCOUNTER — Ambulatory Visit: Payer: Medicare HMO | Admitting: Physical Medicine and Rehabilitation

## 2021-05-06 ENCOUNTER — Encounter: Payer: Self-pay | Admitting: Physical Medicine and Rehabilitation

## 2021-05-06 VITALS — BP 158/81 | HR 69

## 2021-05-06 DIAGNOSIS — M542 Cervicalgia: Secondary | ICD-10-CM

## 2021-05-06 DIAGNOSIS — I6389 Other cerebral infarction: Secondary | ICD-10-CM

## 2021-05-06 DIAGNOSIS — R202 Paresthesia of skin: Secondary | ICD-10-CM | POA: Diagnosis not present

## 2021-05-06 DIAGNOSIS — G890 Central pain syndrome: Secondary | ICD-10-CM

## 2021-05-06 DIAGNOSIS — M5412 Radiculopathy, cervical region: Secondary | ICD-10-CM | POA: Diagnosis not present

## 2021-05-06 NOTE — Progress Notes (Signed)
Pt state neck pain that travels to her left shoulder and down her right arm Pt state any movement makes the pain worse. Pt state she takes pain meds and uses heat / ice to help ease her pain.  Numeric Pain Rating Scale and Functional Assessment Average Pain 10 Pain Right Now 8 My pain is constant, sharp, burning, dull, tingling, and aching Pain is worse with: walking, bending, sitting, standing, some activites, and laying down Pain improves with: rest, heat/ice, and medication   In the last MONTH (on 0-10 scale) has pain interfered with the following?  1. General activity like being  able to carry out your everyday physical activities such as walking, climbing stairs, carrying groceries, or moving a chair?  Rating(7)  2. Relation with others like being able to carry out your usual social activities and roles such as  activities at home, at work and in your community. Rating(8)  3. Enjoyment of life such that you have  been bothered by emotional problems such as feeling anxious, depressed or irritable?  Rating(9)

## 2021-05-06 NOTE — Progress Notes (Signed)
Carla Bowen - 66 y.o. female MRN [id number]  Date of birth: 09-27-1955  Office Visit Note: Visit Date: 05/06/2021 PCP: Dois Davenportichter, Karen L, MD Referred by: Dois Davenportichter, Karen L, MD  Subjective: Chief Complaint  Patient presents with   Neck - Pain   Left Shoulder - Pain   Right Arm - Pain   HPI: Carla Bowen is a 66 y.o. female who comes in today for evaluation of chronic, worsening and severe left sided neck pain radiating to shoulder and arm. Patient reports pain has been ongoing for several months. Patient reports her pain is constant and exacerbated by movement and activity, describes pain as a burning and throbbing sensation, currently rates as 8 out of 10. Patient reports some relief of pain with home exercise regimen, use of ice/heat and Tylenol as needed. Patient is scheduled to start formal physical [facility] [place], [place] on this upcoming Tuesday. Patients recent cervical xray images exhibit degenerative changes primarily at C5-C6 and C6-C7. There is also a 2-3 mm anterolisthesis at C4-C5 with flexion. Patient has not had MRI imaging of her cervical spine. Patient does have history of multiple medication intolerances due to adverse side effects. Patient does have a significant history of right thalamic intracerebral hemorrhage on 12/28/12 with left sided hemiparesis. She also suffers from thalamic pain syndrome, was recently seen by Dr. Charmayne SheerEric Moser [facility]. Patient is currently taking Gabapentin at home to treat neurogenic pain.  Patient states she was recently started on Trileptal and feels that she is not going to be able to tolerate this medication due to adverse side effects. Patient states chronic neck pain is making daily tasks difficult and causing her to be sedentary. Patient denies recent trauma or falls.   Patient does have history of lumbar epidural steroid injections with Dr. Ardell Isaacsavid Spivey and did get good pain relief with facet joint blocks,  however she was not able to move forward with radiofrequency ablation due to cost issues. Patient was evaluated for bilateral lower back pain on 03/25/2020 by Dr. Tyrell AntonioFrederic Gladie Bowen and was referred for neuropsychological evaluation for spinal cord stimulator trial. Patient was unable to move forward with these process due to financial issues.    Review of Systems  Musculoskeletal:  Positive for neck pain.  Neurological:  Positive for tingling, sensory change and weakness.       Patient reports numbness/tingling and weakness to left arm since stroke.   All other systems reviewed and are negative. Otherwise per HPI.  Assessment & Plan: Visit Diagnoses:    ICD-10-CM   1. Radiculopathy, cervical region  M54.12     2. Cervicalgia  M54.2 MR CERVICAL SPINE WO CONTRAST    3. Cerebrovascular accident (CVA) due to other mechanism (HCC)  I63.89     4. Paresthesia of skin  R20.2     5. 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  Patient continues to have excruciating and debilitating pain despite good conservative therapies such as home exercise regimen, use of ice/heat and Tylenol as needed.  Patient's clinical presentation and exam are consistent with cervical radiculopathy. We also feel that there could be a myofascial/fibromyalgia component working to exacerbate her pain, she does have a palpable trigger point to left trapezius region. We believe the next step is to obtain lumbar imaging of the cervical spine. We will  have patient follow-up with [place] after cervical MRI imaging is complete for review and to discuss further treatment options. Patient encouraged to continue with home exercise regimen and physical therapy as tolerated. Patient instructed to inform Dr. Ermalene Postin about intolerance to Trileptal. No red flag symptoms noted upon exam today.   Meds & Orders: No orders of the defined types were  placed in this encounter.   Orders Placed This Encounter  Procedures   MR CERVICAL SPINE WO CONTRAST    Follow-up: Return for follow-up after cervical MRI is obtained for review.   Procedures: No procedures performed      Clinical History: MRI LUMBAR SPINE WITHOUT CONTRAST   TECHNIQUE: Multiplanar, multisequence MR imaging of the lumbar spine was performed. No intravenous contrast was administered.   COMPARISON:  MRI lumbar spine dated April 29, 2017.   FINDINGS: Segmentation:  Standard.   Alignment:  Physiologic.   Vertebrae: No acute fracture, evidence of discitis, or focal bone lesion. New T11 superior endplate Schmorl's node.   Conus medullaris and cauda equina: Conus extends to the L2 level. Conus and cauda equina appear normal.   Paraspinal and other soft tissues: Negative.   Disc levels:   T11-T12 to L1-L2: Negative.   L2-L3:  Unchanged mild disc bulging. No stenosis.   L3-L4: Unchanged small broad-based posterior disc protrusion. Unchanged moderate right and mild left facet arthropathy. No stenosis.   L4-L5: Unchanged small broad-based posterior disc protrusion eccentric to the left. Unchanged mild bilateral facet arthropathy. Unchanged mild left neuroforaminal stenosis. No spinal canal or right neuroforaminal stenosis.   L5-S1: Unchanged small circumferential disc osteophyte complex and mild bilateral facet arthropathy. Unchanged mild bilateral neuroforaminal stenosis. No spinal canal stenosis.   IMPRESSION: Mild degenerative changes of the lumbar spine as described above, overall similar to prior study. No high-grade stenosis or impingement.     Electronically Signed   By: Titus Dubin M.D.   On: 04/19/2019 07:48   She reports that she has been smoking. She has a 10.00 pack-year smoking history. She has never used smokeless tobacco. No results for input(s): HGBA1C, LABURIC in the last 8760 hours.  Objective:  VS:  HT:     WT:    BMI:       BP:(!) 158/81   HR:69bpm   TEMP: ( )   RESP:  Physical Exam Vitals and nursing note reviewed.  HENT:     Head: Normocephalic and atraumatic.     Right Ear: External ear normal.     Left Ear: External ear normal.     Nose: Nose normal.     Mouth/Throat:     Mouth: Mucous membranes are moist.  Eyes:     Extraocular Movements: Extraocular movements intact.  Cardiovascular:     Rate and Rhythm: Normal rate.     Pulses: Normal pulses.  Pulmonary:     Effort: Pulmonary effort is normal.  Abdominal:     General: Abdomen is flat. There is no distension.  Musculoskeletal:        General: Tenderness present.     Cervical back: Tenderness present.     Comments: No discomfort noted with flexion, extension and side-to-side rotation. Generalized weakness noted to left arm consistent with hemiparesis. Spasticity noted to bilateral upper extermities. Negative Hoffman's sign. Tenderness noted upon palpation of trigger point to left trapezius muscle.  Weakness noted to left lower extremity consistent with hemiparesis, gait slow and unsteady.   Skin:    General: Skin is warm and dry.  Capillary Refill: Capillary refill takes less than 2 seconds.  Neurological:     Mental Status: She is alert and oriented to person, place, and time.     Motor: Weakness present.     Gait: Gait abnormal.  Psychiatric:        Mood and Affect: Mood normal.        Behavior: Behavior normal.    Ortho Exam  Imaging: No results found.  Past Medical/Family/Surgical/Social History: Medications & Allergies reviewed per EMR, new medications updated. Patient Active Problem List   Diagnosis Date Noted   COPD (chronic obstructive pulmonary disease) ([place])    Viral upper respiratory infection 12/21/2019   COPD exacerbation ([place]) 12/21/2019   Carpal tunnel syndrome of left wrist 03/27/2019   Hypoxemia    Tracheostomy status (HCC)    Alcohol abuse with intoxication ([place]) 09/24/2017   Alcohol withdrawal syndrome  with complication, with unspecified complication ([place]) 0000000   Macrocytic anemia 09/24/2017   Aortic atherosclerosis ([place]) 09/24/2017   Tracheostomy care ([place]) 09/23/2017   Tracheostomy in place [facility]) 09/23/2017   Acute encephalopathy 09/23/2017   MSSA (methicillin susceptible Staphylococcus aureus) pneumonia ([place]) 09/23/2017   Severe protein-calorie malnutrition ([place]) 09/23/2017   Acute respiratory failure ([place])    Acute ischemic colitis ([place])    Sepsis ([place] ) 09/03/2017   Alcohol use disorder, severe, dependence ([place]) 06/19/2016   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/13/2016   Chest tightness    History of stroke    Left hemiparesis ([place])    Stroke ([place])    Chest pain 11/03/2014   UTI (lower urinary tract infection) 11/03/2014   HTN (hypertension) 11/03/2014   Anxiety and depression 07/30/2014   Muscle spasticity 07/30/2014   ICH (intracerebral hemorrhage) (HCC) 05/30/2013   Spastic hemiplegia affecting nondominant side ([place]) 05/30/2013   Abnormality of gait 05/30/2013   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   Disturbance of skin sensation 05/30/2013   Past Medical History:  Diagnosis Date   Anxiety    Aortic atherosclerosis ([place]) 09/24/2017   COPD (chronic obstructive pulmonary disease) (HCC)    Depression    Fibromyalgia    GERD (gastroesophageal reflux disease)    High cholesterol    Hypertension    Stroke ([place])    Family History  Problem Relation Age of Onset   Lung cancer Father    Aneurysm Mother    Diabetes Brother    Heart attack Brother    Hypertension Brother    Hypertension Sister    Past Surgical History:  Procedure Laterality Date   CESAREAN SECTION     TRACHEOSTOMY     Hx of tracheostomy   Social History   Occupational History   Occupation: Disablity   Tobacco Use   Smoking status: Every Day    Packs/day: 0.50    Years: 20.00    Pack years: 10.00    Types: Cigarettes   Smokeless tobacco: Never  Vaping Use   Vaping Use:  Never used  Substance and Sexual Activity   Alcohol use: Not Currently    Alcohol/week: 10.0 standard drinks    Types: 5 Cans of beer, 5 Shots of liquor per week    Comment: BAC was 247- last drink 08/2017   Drug use: No   Sexual activity: Not on file

## 2021-05-24 ENCOUNTER — Other Ambulatory Visit: Payer: Medicare HMO

## 2021-05-26 ENCOUNTER — Telehealth: Payer: Self-pay | Admitting: Physical Medicine and Rehabilitation

## 2021-05-26 NOTE — Telephone Encounter (Signed)
Pt is calling states she cant afford the MRI, Can something else be done for the pain  ? ?Please call the pt  ?

## 2021-08-20 ENCOUNTER — Ambulatory Visit
Admission: RE | Admit: 2021-08-20 | Discharge: 2021-08-20 | Disposition: A | Discharge status: Home / Self Care | Payer: Medicare HMO | Source: Ambulatory Visit | Attending: Physical Medicine and Rehabilitation | Admitting: Physical Medicine and Rehabilitation

## 2021-08-23 ENCOUNTER — Telehealth: Payer: Self-pay | Admitting: Physical Medicine and Rehabilitation

## 2021-08-23 NOTE — Telephone Encounter (Signed)
Pt calling wondering what her mri results are

## 2021-09-27 ENCOUNTER — Other Ambulatory Visit: Payer: Self-pay | Admitting: Family Medicine

## 2021-09-27 DIAGNOSIS — E038 Other specified hypothyroidism: Secondary | ICD-10-CM

## 2021-10-04 ENCOUNTER — Ambulatory Visit
Admission: RE | Admit: 2021-10-04 | Discharge: 2021-10-04 | Disposition: A | Discharge status: Home / Self Care | Payer: Medicare HMO | Source: Ambulatory Visit | Attending: Family Medicine | Admitting: Family Medicine

## 2021-10-04 DIAGNOSIS — E038 Other specified hypothyroidism: Secondary | ICD-10-CM

## 2021-10-17 ENCOUNTER — Other Ambulatory Visit: Payer: Self-pay

## 2021-10-17 ENCOUNTER — Emergency Department (HOSPITAL_COMMUNITY): Payer: Medicare HMO

## 2021-10-17 ENCOUNTER — Emergency Department (HOSPITAL_COMMUNITY)
Admission: EM | Admit: 2021-10-17 | Discharge: 2021-10-17 | Disposition: A | Discharge status: Home / Self Care | Payer: Medicare HMO | Attending: Emergency Medicine | Admitting: Emergency Medicine

## 2021-10-17 DIAGNOSIS — I471 Supraventricular tachycardia: Secondary | ICD-10-CM | POA: Diagnosis not present

## 2021-10-17 DIAGNOSIS — I4891 Unspecified atrial fibrillation: Secondary | ICD-10-CM | POA: Insufficient documentation

## 2021-10-17 DIAGNOSIS — E876 Hypokalemia: Secondary | ICD-10-CM | POA: Diagnosis not present

## 2021-10-17 DIAGNOSIS — I10 Essential (primary) hypertension: Secondary | ICD-10-CM | POA: Insufficient documentation

## 2021-10-17 DIAGNOSIS — M79601 Pain in right arm: Secondary | ICD-10-CM | POA: Diagnosis not present

## 2021-10-17 DIAGNOSIS — Z79899 Other long term (current) drug therapy: Secondary | ICD-10-CM | POA: Diagnosis not present

## 2021-10-17 DIAGNOSIS — Z7982 Long term (current) use of aspirin: Secondary | ICD-10-CM | POA: Diagnosis not present

## 2021-10-17 DIAGNOSIS — Z7951 Long term (current) use of inhaled steroids: Secondary | ICD-10-CM | POA: Insufficient documentation

## 2021-10-17 DIAGNOSIS — J441 Chronic obstructive pulmonary disease with (acute) exacerbation: Secondary | ICD-10-CM | POA: Diagnosis not present

## 2021-10-17 DIAGNOSIS — R000 Tachycardia, unspecified: Secondary | ICD-10-CM | POA: Diagnosis present

## 2021-10-17 LAB — URINALYSIS, ROUTINE W REFLEX MICROSCOPIC
Bilirubin Urine: NEGATIVE
Glucose, UA: NEGATIVE mg/dL
Hgb urine dipstick: NEGATIVE
Ketones, ur: NEGATIVE mg/dL
Leukocytes,Ua: NEGATIVE
Nitrite: NEGATIVE
Protein, ur: NEGATIVE mg/dL
Specific Gravity, Urine: 1.006 (ref 1.005–1.030)
pH: 7 (ref 5.0–8.0)

## 2021-10-17 LAB — CBC WITH DIFFERENTIAL/PLATELET
Abs Immature Granulocytes: 0.08 10*3/uL — ABNORMAL HIGH (ref 0.00–0.07)
Basophils Absolute: 0 10*3/uL (ref 0.0–0.1)
Basophils Relative: 0 %
Eosinophils Absolute: 0.1 10*3/uL (ref 0.0–0.5)
Eosinophils Relative: 1 %
HCT: 44.4 % (ref 36.0–46.0)
Hemoglobin: 15.2 g/dL — ABNORMAL HIGH (ref 12.0–15.0)
Immature Granulocytes: 1 %
Lymphocytes Relative: 33 %
Lymphs Abs: 3 10*3/uL (ref 0.7–4.0)
MCH: 31.4 pg (ref 26.0–34.0)
MCHC: 34.2 g/dL (ref 30.0–36.0)
MCV: 91.7 fL (ref 80.0–100.0)
Monocytes Absolute: 0.6 10*3/uL (ref 0.1–1.0)
Monocytes Relative: 6 %
Neutro Abs: 5.3 10*3/uL (ref 1.7–7.7)
Neutrophils Relative %: 59 %
Platelets: 250 10*3/uL (ref 150–400)
RBC: 4.84 MIL/uL (ref 3.87–5.11)
RDW: 13.2 % (ref 11.5–15.5)
WBC: 9.1 10*3/uL (ref 4.0–10.5)
nRBC: 0 % (ref 0.0–0.2)

## 2021-10-17 LAB — RAPID URINE DRUG SCREEN, HOSP PERFORMED
Amphetamines: NOT DETECTED
Barbiturates: NOT DETECTED
Benzodiazepines: NOT DETECTED
Cocaine: NOT DETECTED
Opiates: NOT DETECTED
Tetrahydrocannabinol: NOT DETECTED

## 2021-10-17 LAB — I-STAT CHEM 8, ED
BUN: 24 mg/dL — ABNORMAL HIGH (ref 8–23)
Calcium, Ion: 1.13 mmol/L — ABNORMAL LOW (ref 1.15–1.40)
Chloride: 110 mmol/L (ref 98–111)
Creatinine, Ser: 0.6 mg/dL (ref 0.44–1.00)
Glucose, Bld: 115 mg/dL — ABNORMAL HIGH (ref 70–99)
HCT: 43 % (ref 36.0–46.0)
Hemoglobin: 14.6 g/dL (ref 12.0–15.0)
Potassium: 3.3 mmol/L — ABNORMAL LOW (ref 3.5–5.1)
Sodium: 146 mmol/L — ABNORMAL HIGH (ref 135–145)
TCO2: 22 mmol/L (ref 22–32)

## 2021-10-17 LAB — COMPREHENSIVE METABOLIC PANEL
ALT: 24 U/L (ref 0–44)
AST: 16 U/L (ref 15–41)
Albumin: 3.3 g/dL — ABNORMAL LOW (ref 3.5–5.0)
Alkaline Phosphatase: 65 U/L (ref 38–126)
Anion gap: 11 (ref 5–15)
BUN: 20 mg/dL (ref 8–23)
CO2: 21 mmol/L — ABNORMAL LOW (ref 22–32)
Calcium: 8.7 mg/dL — ABNORMAL LOW (ref 8.9–10.3)
Chloride: 109 mmol/L (ref 98–111)
Creatinine, Ser: 0.76 mg/dL (ref 0.44–1.00)
GFR, Estimated: >60 mL/min (ref >60)
Glucose, Bld: 119 mg/dL — ABNORMAL HIGH (ref 70–99)
Potassium: 2.9 mmol/L — ABNORMAL LOW (ref 3.5–5.1)
Sodium: 141 mmol/L (ref 135–145)
Total Bilirubin: 0.6 mg/dL (ref 0.3–1.2)
Total Protein: 5.8 g/dL — ABNORMAL LOW (ref 6.5–8.1)

## 2021-10-17 LAB — ETHANOL: Alcohol, Ethyl (B): 101 mg/dL — ABNORMAL HIGH (ref <10)

## 2021-10-17 LAB — MAGNESIUM: Magnesium: 2.2 mg/dL (ref 1.7–2.4)

## 2021-10-17 LAB — TSH: TSH: 0.148 u[IU]/mL — ABNORMAL LOW (ref 0.350–4.500)

## 2021-10-17 MED ORDER — SODIUM CHLORIDE 0.9 % IV BOLUS
1000.0000 mL | Freq: Once | INTRAVENOUS | Status: AC
  Administered 2021-10-17: 1000 mL via INTRAVENOUS

## 2021-10-17 MED ORDER — DILTIAZEM HCL-DEXTROSE 125-5 MG/125ML-% IV SOLN (PREMIX)
5.0000 mg/h | INTRAVENOUS | Status: DC
  Administered 2021-10-17: 5 mg/h via INTRAVENOUS
  Filled 2021-10-17: qty 125

## 2021-10-17 MED ORDER — POTASSIUM CHLORIDE 10 MEQ/100ML IV SOLN
10.0000 meq | INTRAVENOUS | Status: AC
  Administered 2021-10-17 (×2): 10 meq via INTRAVENOUS
  Filled 2021-10-17 (×2): qty 100

## 2021-10-17 MED ORDER — SODIUM CHLORIDE 0.9 % IV SOLN
INTRAVENOUS | Status: DC
Start: 2021-10-17 — End: 2021-10-17

## 2021-10-17 NOTE — Discharge Instructions (Signed)
Follow up with the cardiologist in the office.  Return for recurrent or persistent symptoms.   Try to have something high in potassium with each meal for the next week.  Have your doctor recheck it for you in about a week.

## 2021-10-17 NOTE — ED Triage Notes (Signed)
Pt arrived via [facility] from home. Per EMS pt said pt woke with pain in arm. Pain upon EMS arrival. Hx of stroke with left side deficients.  198HR 6mg  adenosine, 12mg  adenosine, Cardizem 20mg 

## 2021-10-17 NOTE — ED Provider Notes (Signed)
Received in signout from Dr. Eudelia Bunch.  Patient came in SVT and then later was found to be in A-fib with RVR.  Patient spontaneously converted.  Was found to be hypokalemic.  Plan for replenishment of potassium.  On reassessment the patient continues to feel well.  Normal sinus rhythm with a rate in the 60s.  Given cardiology follow-up.   Melene Plan, DO 10/17/21 [phone]

## 2021-10-17 NOTE — ED Provider Notes (Signed)
[facility] EMERGENCY DEPARTMENT Provider Note  CSN: [id number] Arrival date & time: 10/17/21 0436  Chief Complaint(s) Tachycardia  HPI Carla Bowen is a 66 y.o. female with a past medical history listed below including hypertension, hyperlipidemia, hemorrhagic stroke status post left-sided deficits who presents to the emergency department with sudden onset right arm pain found to be in SVT by EMS with rates in the 140s to 200s.  Initially resolved with 2 doses of adenosine but returned while in route.  Patient was given a dose of Cardizem.  She denied any associated chest pain or shortness of breath.  No recent fevers or infections.  No coughing or congestion.  No nausea or vomiting.  No abdominal pain.  She did report dealing with constipation over the past 2 to 3 weeks and taking over-the-counter Dulcolax.  No urinary symptoms.  Patient has a history of alcohol use disorder but states that she no longer drinks every day but did admit to drinking a few sips of whiskey tonight.  Denies any illicit drug use.  The history is provided by the patient and the EMS personnel.    Past Medical History Past Medical History:  Diagnosis Date   Anxiety    Aortic atherosclerosis ([place]) 09/24/2017   COPD (chronic obstructive pulmonary disease) (HCC)    Depression    Fibromyalgia    GERD (gastroesophageal reflux disease)    High cholesterol    Hypertension    Stroke [facility])    Patient Active Problem List   Diagnosis Date Noted   COPD (chronic obstructive pulmonary disease) ([place])    Viral upper respiratory infection 12/21/2019   COPD exacerbation ([place]) 12/21/2019   Carpal tunnel syndrome of left wrist 03/27/2019   Hypoxemia    Tracheostomy status ([place])    Alcohol abuse with intoxication ([place]) 09/24/2017   Alcohol withdrawal syndrome with complication, with unspecified complication ([place]) 0000000   Macrocytic anemia 09/24/2017   Aortic atherosclerosis ([place]) 09/24/2017    Tracheostomy care ([place]) 09/23/2017   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   Acute encephalopathy 09/23/2017   MSSA (methicillin susceptible Staphylococcus aureus) pneumonia ([place]) 09/23/2017   Severe protein-calorie malnutrition ([place]) 09/23/2017   Acute respiratory failure (HCC)    Acute ischemic colitis ([place])    Sepsis ([place]) 09/03/2017   Alcohol use disorder, severe, dependence ([place]) 06/19/2016   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 Authorizing Provider  amLODipine (NORVASC) 5 MG tablet Take 5 mg by mouth daily. 01/06/20   [provider]  aspirin EC 81 MG tablet Take 81 mg by mouth daily.    [provider]  baclofen (LIORESAL) 10 MG tablet Take 10 mg by mouth 3 (three) times daily as needed. 10/24/19   [provider]  Cholecalciferol (VITAMIN D3) 50 MCG (2000 UT) TABS Take by mouth.    [provider]  ciprofloxacin (CIPRO) 250 MG tablet Take 250  mg by mouth 2 (two) times daily. 01/21/20   [provider]  famotidine (PEPCID) 40 MG/5ML suspension Place 2.5 mLs (20 mg total) into feeding tube every 12 (twelve) hours. 09/25/17   Samuella Cota, MD  fluconazole (DIFLUCAN) 150 MG tablet Take 150 mg by mouth once. 01/23/20   [provider]  fluticasone (FLONASE) 50 MCG/ACT nasal spray Place 1-2 sprays into both nostrils daily.     [provider]  Fluticasone-Umeclidin-Vilant (TRELEGY ELLIPTA) 100-62.5-25 MCG/INH AEPB Inhale into the lungs.    [provider]  gabapentin (NEURONTIN) 100 MG capsule Take 100 mg by mouth 2 (two) times daily.    [provider]  gabapentin (NEURONTIN) 600 MG tablet Take 600 mg by mouth at bedtime.    [provider]  levocetirizine (XYZAL) 5 MG tablet  12/05/18   [provider]  losartan (COZAAR) 100 MG tablet Take 100 mg by mouth daily. 01/06/20   [provider]  meclizine (ANTIVERT) 25 MG tablet Take 1 tablet (25 mg total) by mouth 3 (three) times daily as needed for dizziness. 01/02/20   Domenic Moras, PA-C  metoprolol succinate (TOPROL-XL) 50 MG 24 hr tablet Take 75 mg by mouth daily. 01/06/20   [provider]  metoprolol tartrate (LOPRESSOR) 25 MG tablet Take 25 mg by mouth 2 (two) times daily. 03/21/20   [provider]  nitrofurantoin, macrocrystal-monohydrate, (MACROBID) 100 MG capsule Take 100 mg by mouth 2 (two) times daily. 02/02/20   [provider]  rosuvastatin (CRESTOR) 40 MG tablet Take 40 mg by mouth at bedtime.    [provider]  vitamin B-12 (CYANOCOBALAMIN) 1000 MCG tablet Take 1,000 mcg by mouth daily.    [provider]                                                                                                                                    Allergies Sulfa antibiotics, Ciprofloxacin, Codeine, and Tramadol  Review of Systems Review of Systems As noted in HPI  Physical Exam Vital Signs  I have reviewed the triage vital signs BP 130/65   Pulse 88   Temp 97.6 F (36.4 C) (Oral)   Resp 20   Ht 5\' 1"  (1.549 m)   Wt 46.3 kg   LMP  (LMP Unknown)   SpO2 99%   BMI 19.27 kg/m   Physical Exam Vitals reviewed.  Constitutional:      General: She is not in acute distress.    Appearance: She is well-developed. She is not diaphoretic.  HENT:     Head:  Normocephalic and atraumatic.     Nose: Nose normal.  Eyes:     General: No scleral icterus.       Right eye: No discharge.        Left eye: No discharge.     Conjunctiva/sclera: Conjunctivae normal.     Pupils: Pupils are equal, round,  and reactive to light.  Cardiovascular:     Rate and Rhythm: Tachycardia present. Rhythm irregularly irregular.     Heart sounds: No murmur heard.    No friction rub. No gallop.  Pulmonary:     Effort: Pulmonary effort is normal. No respiratory distress.     Breath sounds: Normal breath sounds. No stridor. No rales.  Abdominal:     General: There is no distension.     Palpations: Abdomen is soft.     Tenderness: There is no abdominal tenderness.  Musculoskeletal:        General: No tenderness.     Cervical back: Normal range of motion and neck supple.  Skin:    General: Skin is warm and dry.     Findings: No erythema or rash.  Neurological:     Mental Status: She is alert and oriented to person, place, and time.     Comments: Left sided deficits (baseline per patient)     ED Results and Treatments Labs (all labs ordered are listed, but only abnormal results are displayed) Labs Reviewed  TSH - Abnormal; Notable for the following components:      Result Value   TSH 0.148 (*)    All other components within normal limits  CBC WITH DIFFERENTIAL/PLATELET - Abnormal; Notable for the following components:   Hemoglobin 15.2 (*)    Abs Immature Granulocytes 0.08 (*)    All other components within normal limits  COMPREHENSIVE METABOLIC PANEL - Abnormal; Notable for the following components:   Potassium 2.9 (*)    CO2 21 (*)    Glucose, Bld 119 (*)    Calcium 8.7 (*)    Total Protein 5.8 (*)    Albumin 3.3 (*)    All other components within normal limits  ETHANOL - Abnormal; Notable for the following components:   Alcohol, Ethyl (B) 101 (*)    All other components within normal limits  URINALYSIS, ROUTINE W REFLEX MICROSCOPIC - Abnormal;  Notable for the following components:   Color, Urine STRAW (*)    All other components within normal limits  I-STAT CHEM 8, ED - Abnormal; Notable for the following components:   Sodium 146 (*)    Potassium 3.3 (*)    BUN 24 (*)    Glucose, Bld 115 (*)    Calcium, Ion 1.13 (*)    All other components within normal limits  MAGNESIUM  RAPID URINE DRUG SCREEN, HOSP PERFORMED                                                                                                                         EKG  EKG Interpretation  Date/Time:  Sunday October 17 2021 04:45:41 EDT Ventricular Rate:  93 PR Interval:    QRS Duration: 89 QT Interval:  368 QTC Calculation: 458 R Axis:   56 Text Interpretation: Atrial flutter/fibrillation Posterior infarct, old Borderline ST depression, anterolateral leads Confirmed by Drema Pry [phone]) on 10/17/2021 4:53:38 AM  EKG Interpretation  Date/Time:  Sunday October 17 2021 05:09:23 EDT Ventricular Rate:  78 PR Interval:  137 QRS Duration: 89 QT Interval:  401 QTC Calculation: 457 R Axis:   54 Text Interpretation: Sinus rhythm Abnormal R-wave progression, early transition Confirmed by Addison Lank [phone]) on 10/17/2021 8:07:26 AM        Radiology DG Chest Port 1 View  Result Date: 10/17/2021 CLINICAL DATA:  Tachycardia.  Pain and arm.  History of stroke. EXAM: PORTABLE CHEST 1 VIEW COMPARISON:  01/02/2020 FINDINGS: Heart size and mediastinal contours are normal. No pleural effusion or edema. No airspace opacities identified. Thoracic scoliosis deformity is convex towards the left. IMPRESSION: No acute cardiopulmonary abnormalities. Electronically Signed   By: Kerby Moors M.D.   On: 10/17/2021 05:08    Pertinent labs & imaging results that were available during my care of the patient were reviewed by me and considered in my medical decision making (see MDM for details).  Medications Ordered in ED Medications  sodium chloride 0.9 % bolus  1,000 mL (0 mLs Intravenous Stopped 10/17/21 0539)    And  0.9 %  sodium chloride infusion ( Intravenous New Bag/Given 10/17/21 0536)  diltiazem (CARDIZEM) 125 mg in dextrose 5% 125 mL (1 mg/mL) infusion (0 mg/hr Intravenous Stopped 10/17/21 0555)  potassium chloride 10 mEq in 100 mL IVPB (0 mEq Intravenous Stopped 10/17/21 0748)                                                                                                                                     Procedures Procedures  (including critical care time)  Medical Decision Making / ED Course    Complexity of Problem:  Co-morbidities/SDOH that complicate the patient evaluation/care: Noted above in HPI  Patient's presenting problem/concern, DDX, and MDM listed below: SVT Converted to A-fib RVR after Cardizem bolus by EMS Will assess for electrolyte abnormalities. Possibly related to EtOH use Will check for infectious source  Hospitalization Considered:  yes  Initial Intervention:  Dilt gtt and IVF bolus    Complexity of Data:   Cardiac Monitoring: The patient was maintained on a cardiac monitor.   I personally viewed and interpreted the cardiac monitored which showed an underlying rhythm of AFRVR EKG confirming AFRVR  Laboratory Tests ordered listed below with my independent interpretation: CBC without leukocytosis or anemia Metabolic panel notable for hypokalemia.  No other electrolyte derangements or renal sufficiency. EtOH 100. UA without evidence of infection UDS negative   Imaging Studies ordered listed below with my independent interpretation: Chest x-ray without evidence of pneumonia, pneumothorax or pulmonary edema     ED Course:    Assessment, Add'l Intervention, and Reassessment: SVT converted to A-fib RVR Patient placed on dill drip and IV fluids Converted to normal sinus within 10 minutes Able to wean off of dill drip Noted to have hypokalemia which may be the cause of her tachycardia  dysrhythmia. Also  positive for EtOH We will continue to monitor patient while replating potassium. If she remains stable and normal sinus rhythm patient can be discharged home with cardiology follow-up.    Final Clinical Impression(s) / ED Diagnoses Final diagnoses:  SVT (supraventricular tachycardia) (HCC)  Atrial fibrillation with RVR (HCC)   The patient appears reasonably screened and/or stabilized for discharge and I doubt any other medical condition or other [facility] requiring further screening, evaluation, or treatment in the ED at this time. I have discussed the findings, Dx and Tx plan with the patient/family who expressed understanding and agree(s) with the plan. Discharge instructions discussed at length. The patient/family was given strict return precautions who verbalized understanding of the instructions. No further questions at time of discharge.  Disposition: Discharge  Condition: Good  ED Discharge Orders          Ordered    Ambulatory referral to Cardiology       Comments: If you have not heard from the Cardiology office within the next 72 hours please call [phone].   10/17/21 0810    Amb Referral to AFIB Clinic        10/17/21 0810            Follow Up: Dois Davenport, MD [address] [place] [address] [place] [address] [phone]  Call in 1 day discuss your visit, see when they want to see you in the office           This chart was dictated using voice recognition software.  Despite best efforts to proofread,  errors can occur which can change the documentation meaning.    Nira Conn, MD 10/17/21 [phone]

## 2021-11-04 ENCOUNTER — Ambulatory Visit: Payer: Medicare HMO | Admitting: Cardiology

## 2021-11-06 ENCOUNTER — Emergency Department (HOSPITAL_COMMUNITY)
Admission: EM | Admit: 2021-11-06 | Discharge: 2021-11-06 | Disposition: A | Discharge status: Home / Self Care | Payer: Medicare HMO | Attending: Emergency Medicine | Admitting: Emergency Medicine

## 2021-11-06 ENCOUNTER — Emergency Department (HOSPITAL_COMMUNITY): Payer: Medicare HMO

## 2021-11-06 ENCOUNTER — Other Ambulatory Visit: Payer: Self-pay

## 2021-11-06 ENCOUNTER — Encounter (HOSPITAL_COMMUNITY): Payer: Self-pay | Admitting: Emergency Medicine

## 2021-11-06 DIAGNOSIS — R000 Tachycardia, unspecified: Secondary | ICD-10-CM | POA: Diagnosis not present

## 2021-11-06 DIAGNOSIS — R109 Unspecified abdominal pain: Secondary | ICD-10-CM | POA: Insufficient documentation

## 2021-11-06 DIAGNOSIS — R519 Headache, unspecified: Secondary | ICD-10-CM | POA: Diagnosis not present

## 2021-11-06 DIAGNOSIS — Z7982 Long term (current) use of aspirin: Secondary | ICD-10-CM | POA: Diagnosis not present

## 2021-11-06 DIAGNOSIS — R110 Nausea: Secondary | ICD-10-CM | POA: Diagnosis not present

## 2021-11-06 DIAGNOSIS — Z79899 Other long term (current) drug therapy: Secondary | ICD-10-CM | POA: Insufficient documentation

## 2021-11-06 LAB — RAPID URINE DRUG SCREEN, HOSP PERFORMED
Amphetamines: NOT DETECTED
Barbiturates: NOT DETECTED
Benzodiazepines: NOT DETECTED
Cocaine: NOT DETECTED
Opiates: NOT DETECTED
Tetrahydrocannabinol: NOT DETECTED

## 2021-11-06 LAB — URINALYSIS, ROUTINE W REFLEX MICROSCOPIC
Bilirubin Urine: NEGATIVE
Glucose, UA: NEGATIVE mg/dL
Hgb urine dipstick: NEGATIVE
Ketones, ur: NEGATIVE mg/dL
Leukocytes,Ua: NEGATIVE
Nitrite: NEGATIVE
Protein, ur: NEGATIVE mg/dL
Specific Gravity, Urine: 1.005 (ref 1.005–1.030)
pH: 7 (ref 5.0–8.0)

## 2021-11-06 LAB — CBC WITH DIFFERENTIAL/PLATELET
Abs Immature Granulocytes: 0.02 10*3/uL (ref 0.00–0.07)
Basophils Absolute: 0 10*3/uL (ref 0.0–0.1)
Basophils Relative: 1 %
Eosinophils Absolute: 0.1 10*3/uL (ref 0.0–0.5)
Eosinophils Relative: 1 %
HCT: 45.4 % (ref 36.0–46.0)
Hemoglobin: 15.2 g/dL — ABNORMAL HIGH (ref 12.0–15.0)
Immature Granulocytes: 0 %
Lymphocytes Relative: 44 %
Lymphs Abs: 2.5 10*3/uL (ref 0.7–4.0)
MCH: 31.5 pg (ref 26.0–34.0)
MCHC: 33.5 g/dL (ref 30.0–36.0)
MCV: 94.2 fL (ref 80.0–100.0)
Monocytes Absolute: 0.4 10*3/uL (ref 0.1–1.0)
Monocytes Relative: 8 %
Neutro Abs: 2.7 10*3/uL (ref 1.7–7.7)
Neutrophils Relative %: 46 %
Platelets: 227 10*3/uL (ref 150–400)
RBC: 4.82 MIL/uL (ref 3.87–5.11)
RDW: 12.7 % (ref 11.5–15.5)
WBC: 5.7 10*3/uL (ref 4.0–10.5)
nRBC: 0 % (ref 0.0–0.2)

## 2021-11-06 LAB — COMPREHENSIVE METABOLIC PANEL
ALT: 16 U/L (ref 0–44)
AST: 14 U/L — ABNORMAL LOW (ref 15–41)
Albumin: 3.7 g/dL (ref 3.5–5.0)
Alkaline Phosphatase: 64 U/L (ref 38–126)
Anion gap: 11 (ref 5–15)
BUN: 12 mg/dL (ref 8–23)
CO2: 20 mmol/L — ABNORMAL LOW (ref 22–32)
Calcium: 8.7 mg/dL — ABNORMAL LOW (ref 8.9–10.3)
Chloride: 110 mmol/L (ref 98–111)
Creatinine, Ser: 0.59 mg/dL (ref 0.44–1.00)
GFR, Estimated: >60 mL/min (ref >60)
Glucose, Bld: 112 mg/dL — ABNORMAL HIGH (ref 70–99)
Potassium: 3.3 mmol/L — ABNORMAL LOW (ref 3.5–5.1)
Sodium: 141 mmol/L (ref 135–145)
Total Bilirubin: 0.3 mg/dL (ref 0.3–1.2)
Total Protein: 6.2 g/dL — ABNORMAL LOW (ref 6.5–8.1)

## 2021-11-06 LAB — LIPASE, BLOOD: Lipase: 47 U/L (ref 11–51)

## 2021-11-06 MED ORDER — PROCHLORPERAZINE EDISYLATE 10 MG/2ML IJ SOLN
10.0000 mg | Freq: Once | INTRAMUSCULAR | Status: AC
  Administered 2021-11-06: 10 mg via INTRAVENOUS
  Filled 2021-11-06: qty 2

## 2021-11-06 NOTE — ED Provider Notes (Signed)
[facility] EMERGENCY DEPARTMENT Provider Note   CSN: [id number] Arrival date & time: 11/06/21  0056     History  Chief Complaint  Patient presents with   Abdominal Pain    Carla Bowen is a 66 y.o. female.  66 yo F who presents to the ED with nausea/vomiting/facial pain/abdominal pain. Apparently had severe facial pain that is worsened beyond her chronic facial pain. Took two of the baclofen that she had previously (expired over a year ago) and subsequently had nausea and dry heaves. States her abdominal pain has been there for years. EMS states she went unconscious multiple times en route. Reportedly had pinpoint pupils so was given narcan with reported improvement. Patient denies other symptoms at this time aside from nausea and chronic pain issues.    Abdominal Pain      Home Medications Prior to Admission medications   Medication Sig Start Date End Date Taking? Authorizing Provider  amLODipine (NORVASC) 5 MG tablet Take 5 mg by mouth daily. 01/06/20   [provider]  aspirin EC 81 MG tablet Take 81 mg by mouth daily.    [provider]  baclofen (LIORESAL) 10 MG tablet Take 10 mg by mouth 3 (three) times daily as needed. 10/24/19   [provider]  Cholecalciferol (VITAMIN D3) 50 MCG (2000 UT) TABS Take by mouth.    [provider]  ciprofloxacin (CIPRO) 250 MG tablet Take 250 mg by mouth 2 (two) times daily. 01/21/20   [provider]  famotidine (PEPCID) 40 MG/5ML suspension Place 2.5 mLs (20 mg total) into feeding tube every 12 (twelve) hours. 09/25/17   Standley Brooking, MD  fluconazole (DIFLUCAN) 150 MG tablet Take 150 mg by mouth once. 01/23/20   [provider]  fluticasone (FLONASE) 50 MCG/ACT nasal spray Place 1-2 sprays into both nostrils daily.    [provider]  Fluticasone-Umeclidin-Vilant (TRELEGY ELLIPTA) 100-62.5-25 MCG/INH AEPB Inhale into the lungs.    [provider]  gabapentin (NEURONTIN) 100 MG capsule Take 100 mg by mouth 2 (two) times daily.    [provider]  gabapentin (NEURONTIN) 600 MG tablet Take 600 mg by mouth at bedtime.    [provider]  levocetirizine (XYZAL) 5 MG tablet  12/05/18   [provider]  losartan (COZAAR) 100 MG tablet Take 100 mg by mouth daily. 01/06/20   [provider]  meclizine (ANTIVERT) 25 MG tablet Take 1 tablet (25 mg total) by mouth 3 (three) times daily as needed for dizziness. 01/02/20   Fayrene Helper, PA-C  metoprolol succinate (TOPROL-XL) 50 MG 24 hr tablet Take 75 mg by mouth daily. 01/06/20   [provider]  metoprolol tartrate (LOPRESSOR) 25 MG tablet Take 25 mg by mouth 2 (two) times daily. 03/21/20   [provider]  nitrofurantoin, macrocrystal-monohydrate, (MACROBID) 100 MG capsule Take 100 mg by mouth 2 (two) times daily. 02/02/20   [provider]  rosuvastatin (CRESTOR) 40 MG tablet Take 40 mg by mouth at bedtime.    [provider]  vitamin B-12 (CYANOCOBALAMIN) 1000 MCG tablet Take 1,000 mcg by mouth daily.    [provider]      Allergies    Sulfa antibiotics, Ciprofloxacin, Codeine, and Tramadol    Review of Systems   Review of Systems  Gastrointestinal:  Positive for abdominal pain.    Physical Exam Updated Vital Signs BP (!) 153/84   Pulse 79   Temp 98.1 F (36.7 C)  Resp (!) 21   LMP  (LMP Unknown)   SpO2 97%  Physical Exam Vitals and nursing note reviewed.  Constitutional:      Appearance: She is well-developed.  HENT:     Head: Normocephalic and atraumatic.     Mouth/Throat:     Mouth: Mucous membranes are dry.  Eyes:     Pupils: Pupils are equal, round, and reactive to light.  Cardiovascular:     Rate and Rhythm: Regular rhythm. Tachycardia present.  Pulmonary:     Effort: No respiratory distress.     Breath sounds: No stridor.  Abdominal:     General: There is no distension or abdominal  bruit.  Musculoskeletal:        General: No swelling or tenderness. Normal range of motion.     Cervical back: Normal range of motion.  Skin:    General: Skin is warm and dry.     Coloration: Skin is not jaundiced.  Neurological:     General: No focal deficit present.     Mental Status: She is alert.  Psychiatric:     Comments: Labile affect     ED Results / Procedures / Treatments   Labs (all labs ordered are listed, but only abnormal results are displayed) Labs Reviewed  CBC WITH DIFFERENTIAL/PLATELET - Abnormal; Notable for the following components:      Result Value   Hemoglobin 15.2 (*)    All other components within normal limits  COMPREHENSIVE METABOLIC PANEL - Abnormal; Notable for the following components:   Potassium 3.3 (*)    CO2 20 (*)    Glucose, Bld 112 (*)    Calcium 8.7 (*)    Total Protein 6.2 (*)    AST 14 (*)    All other components within normal limits  URINALYSIS, ROUTINE W REFLEX MICROSCOPIC - Abnormal; Notable for the following components:   Color, Urine STRAW (*)    All other components within normal limits  LIPASE, BLOOD  RAPID URINE DRUG SCREEN, HOSP PERFORMED    EKG None  Radiology CT Head Wo Contrast  Result Date: 11/06/2021 CLINICAL DATA:  Altered mental status EXAM: CT HEAD WITHOUT CONTRAST TECHNIQUE: Contiguous axial images were obtained from the base of the skull through the vertex without intravenous contrast. RADIATION DOSE REDUCTION: This exam was performed according to the departmental dose-optimization program which includes automated exposure control, adjustment of the mA and/or kV according to patient size and/or use of iterative reconstruction technique. COMPARISON:  09/13/2017 FINDINGS: Brain: No evidence of acute infarction, hemorrhage, hydrocephalus, extra-axial collection or mass lesion/mass effect. Mild subcortical white matter and periventricular small vessel ischemic changes. Vascular: No hyperdense vessel or unexpected  calcification. Skull: Normal. Negative for fracture or focal lesion. Sinuses/Orbits: The visualized paranasal sinuses are essentially clear. The mastoid air cells are unopacified. Other: None. IMPRESSION: No evidence of acute intracranial abnormality. Mild small vessel ischemic changes. Electronically Signed   By: Charline Bills M.D.   On: 11/06/2021 01:43   DG Chest Portable 1 View  Result Date: 11/06/2021 CLINICAL DATA:  Chest and abdominal pain EXAM: PORTABLE CHEST 1 VIEW COMPARISON:  10/17/2021 FINDINGS: Cardiac shadow is within normal limits. Lungs are clear bilaterally. No acute bony abnormality is seen. No free air is noted. IMPRESSION: No acute abnormality noted. Electronically Signed   By: Alcide Clever M.D.   On: 11/06/2021 01:29    Procedures Procedures    Medications Ordered in ED Medications  prochlorperazine (COMPAZINE) injection 10 mg (10 mg Intravenous  Given 11/06/21 0155)    ED Course/ Medical Decision Making/ A&P                           Medical Decision Making Amount and/or Complexity of Data Reviewed Labs: ordered. Radiology: ordered.  Risk Prescription drug management.  May be side effects from baclofen, will treat symptomatically.  Multiple reevaluations and patient symptom free. Ambulating, urinating, eating food without difficulty. Family here. Will refer to neurology for possible trigeminal neuralga (facial pain). Also asked to me to feel soft spots on her scalp. I don't appreciate any significant abnormalities on exam or on ct scan. Will fu w/ pcp for same.   Final Clinical Impression(s) / ED Diagnoses Final diagnoses:  Nausea  Abdominal pain, unspecified abdominal location    Rx / DC Orders ED Discharge Orders          Ordered    Ambulatory referral to Neurology       Comments: An appointment is requested in approximately: 4 weeks   11/06/21 0503              Wally Shevchenko, Barbara Cower, MD 11/06/21 [phone]

## 2021-11-06 NOTE — ED Notes (Signed)
Attempted to give belongings found in room to pt, stated they were not hers.

## 2021-11-06 NOTE — ED Triage Notes (Signed)
Patient from home, complaining of facial pain.  She has had a stroke in the past, took two baclofen that are expired. She then had some nausea and vomiting, had some unconsciousness episodes and was given Narcan and has responded appropriately to it.  Patient does have some nausea at this time.  Patient did have pinpoint pupils which responded to the narcan.  Patient does have ETOH on board.

## 2021-11-08 ENCOUNTER — Other Ambulatory Visit (HOSPITAL_COMMUNITY)
Admission: RE | Admit: 2021-11-08 | Discharge: 2021-11-08 | Disposition: A | Discharge status: Home / Self Care | Payer: Medicare HMO | Source: Ambulatory Visit | Attending: Obstetrics & Gynecology | Admitting: Obstetrics & Gynecology

## 2021-11-08 ENCOUNTER — Ambulatory Visit: Payer: Medicare HMO | Admitting: Obstetrics & Gynecology

## 2021-11-08 ENCOUNTER — Encounter: Payer: Self-pay | Admitting: Obstetrics & Gynecology

## 2021-11-08 VITALS — BP 146/80 | Ht 61.0 in | Wt 104.0 lb

## 2021-11-08 DIAGNOSIS — N950 Postmenopausal bleeding: Secondary | ICD-10-CM

## 2021-11-08 DIAGNOSIS — Z113 Encounter for screening for infections with a predominantly sexual mode of transmission: Secondary | ICD-10-CM

## 2021-11-08 NOTE — Progress Notes (Signed)
    Carla Bowen September 10, 1955 [id number]        66 y.o.  G2P1011 Separated.  RP: PMB x 7 days 2 weeks ago  HPI: Postmenopause x >10 years.  No HRT.  Had PMB for the first time 2 weeks ago.  Mild bleeding x 7 days.  No pelvic pain.  Urine/BMs normal.  Had IC with her husband from whom she is separated 2 months ago.   OB History  Gravida Para Term Preterm AB Living  2 1 1   1 1   SAB IAB Ectopic Multiple Live Births  0   0        # Outcome Date GA Lbr Len/2nd Weight Sex Delivery Anes PTL Lv  2 AB           1 Term             Past medical history,surgical history, problem list, medications, allergies, family history and social history were all reviewed and documented in the EPIC chart.   Directed ROS with pertinent positives and negatives documented in the history of present illness/assessment and plan.  Exam:  Vitals:   11/08/21 1544  BP: (!) 146/80  Weight: 104 lb (47.2 kg)  Height: 5\' 1"  (1.549 m)   General appearance:  Normal  Abdomen: Normal  Gynecologic exam: Vulva normal.  Speculum:  Cervix/Vaginal normal.  No lesion.  No bleeding or discharge.  Pap reflex/Gono-Chlam done.  Informed written consent for EBx obtained.  Betadine prep.  Hurricane spray.  Tenaculum on anterior lip of cervix.  Os finder.  Endometrial Bx with pipette on all IU surfaces with negative pressure.  Small specimen sent to pathology.  Instruments removed.  No Cx.     Assessment/Plan:  66 y.o. G2P1011   1. Postmenopausal bleeding Postmenopause x >10 years.  No HRT.  Had PMB for the first time 2 weeks ago.  Mild bleeding x 7 days.  No pelvic pain.  Urine/BMs normal.  Had IC with her husband from whom she is separated 2 months ago.  Normal Gyn exam.  Pap reflex/Gono-Chlam and EBx done.  No Cx.  F/U Pelvic to further investigate. - Surgical pathology( [place]/ [facility]) - Cytology - PAP( [place]) - 66 Transvaginal Non-OB; Future  2. Screen for STD (sexually transmitted  disease) Gono-Chlam done on Pap.  Other orders - potassium chloride (KLOR-CON M) 10 MEQ tablet; Take 10 mEq by mouth 2 (two) times daily. - linaclotide (LINZESS) 72 MCG capsule; Take 72 mcg by mouth daily before breakfast. - omeprazole (PRILOSEC) 40 MG capsule; Take 40 mg by mouth daily.   [place] MD, 4:13 PM 11/08/2021

## 2021-11-10 LAB — SURGICAL PATHOLOGY

## 2021-11-12 LAB — CYTOLOGY - PAP
Adequacy: ABSENT
Chlamydia: NEGATIVE
Comment: NEGATIVE
Comment: NORMAL
Diagnosis: NEGATIVE
Neisseria Gonorrhea: NEGATIVE

## 2021-11-15 DIAGNOSIS — E785 Hyperlipidemia, unspecified: Secondary | ICD-10-CM | POA: Insufficient documentation

## 2021-11-15 NOTE — Progress Notes (Unsigned)
Cardiology Office Note   Date:  11/17/2021   ID:  Carla Bowen, DOB 11/28/1955, MRN [id number]  PCP:  Dois Davenport, MD  Cardiologist:   None Referring:  Dois Davenport, MD  Chief Complaint  Patient presents with   Atrial Fibrillation      History of Present Illness: Carla Bowen is a 66 y.o. female who presents for evaluation of atrial fibrillation.  She was in the emergency room in August and 6 and I did review this.  She had a rapid heartbeat she called EMS.  She was said to be in SVT.  In the emergency room she was in atrial fibrillation.  She was given Cardizem on route.  She was given Cardizem IV in the emergency room and eventually she converted to sinus rhythm.  I do note that her alcohol level was elevated.  She also had a TSH that came back at 0.148.  Her potassium was low at 2.9.  She has never had these palpitations before and has not had them since.  She had a previous stroke in [place] that she says was secondary to CNS bleed.  I do see a head CT in late August that had no acute abnormalities.  She was in the emergency room again at that time because she took some old baclofen and got nausea vomiting and became very weak.  She actually received Narcan on route to the emergency room.  Her symptoms eventually resolved.  Her drug screen was negative.  She has not had any prior cardiac history other than probably chest pain that she does not recall.  She was see in 2021 by another cardiology group in town.  She had chest pain and difficult to control HTN.  She was to have an echo and a perfusion study.  I don't see these results.    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  She has chronic pain as well.  However, she does not describe chest pressure, neck or arm discomfort.  She does not have any presyncope.  She does have falls.  She does not have any new shortness of breath, PND or orthopnea.  She had no weight gain or edema.   Past  Medical History:  Diagnosis Date   Anxiety    Aortic atherosclerosis (HCC) 09/24/2017   COPD (chronic obstructive pulmonary disease) (HCC)    Depression    Fibromyalgia    GERD (gastroesophageal reflux disease)    High cholesterol    Hypertension    Stroke [facility])     Past Surgical History:  Procedure Laterality Date   CESAREAN SECTION     TRACHEOSTOMY     Hx of tracheostomy     Current Outpatient Medications  Medication Sig Dispense Refill   baclofen (LIORESAL) 10 MG tablet Take 10 mg by mouth 3 (three) times daily as needed.     Cholecalciferol (VITAMIN D3) 50 MCG (2000 UT) TABS Take by mouth.     Fluticasone-Umeclidin-Vilant (TRELEGY ELLIPTA) 100-62.5-25 MCG/INH AEPB Inhale into the lungs.     gabapentin (NEURONTIN) 100 MG capsule Take 100 mg by mouth 3 (three) times daily.     gabapentin (NEURONTIN) 600 MG tablet Take 600 mg by mouth at bedtime.     levocetirizine (XYZAL) 5 MG tablet      linaclotide (LINZESS) 72 MCG capsule Take 72 mcg by mouth daily before breakfast.     losartan (COZAAR) 100 MG tablet Take 100 mg  by mouth daily.     meclizine (ANTIVERT) 25 MG tablet Take 1 tablet (25 mg total) by mouth 3 (three) times daily as needed for dizziness. 30 tablet 0   metoprolol tartrate (LOPRESSOR) 25 MG tablet Take 25 mg by mouth 2 (two) times daily.     omeprazole (PRILOSEC) 40 MG capsule Take 40 mg by mouth daily.     potassium chloride (KLOR-CON M) 10 MEQ tablet Take 10 mEq by mouth 2 (two) times daily.     No current facility-administered medications for this visit.    Allergies:   Sulfa antibiotics, Ciprofloxacin, Codeine, and Tramadol    Social History:  The patient  reports that she has been smoking cigarettes. She has a 10.00 pack-year smoking history. She has never used smokeless tobacco. She reports that she does not currently use alcohol after a past usage of about 10.0 standard drinks of alcohol per week. She reports that she does not use drugs.   Family  History:  The patient's family history includes Aneurysm in her mother; Diabetes in her brother; Heart attack in her brother; Hypertension in her brother and sister; Lung cancer in her father.    ROS:  Please see the history of present illness.   Otherwise, review of systems are positive for none.   All other systems are reviewed and negative.    PHYSICAL EXAM: VS:  BP 122/66 (BP Location: Right Arm, Patient Position: Sitting, Cuff Size: Normal)   Pulse 72   Ht 5\' 1"  (1.549 m)   Wt 103 lb (46.7 kg)   LMP  (LMP Unknown)   SpO2 98%   BMI 19.46 kg/m  , BMI Body mass index is 19.46 kg/m. GENERAL:  Well appearing HEENT:  Pupils equal round and reactive, fundi not visualized, oral mucosa unremarkable NECK:  No jugular venous distention, waveform within normal limits, carotid upstroke brisk and symmetric, no bruits, no thyromegaly LYMPHATICS:  No cervical, inguinal adenopathy LUNGS:  Clear to auscultation bilaterally BACK:  No CVA tenderness CHEST:  Unremarkable HEART:  PMI not displaced or sustained,S1 and S2 within normal limits, no S3, no S4, no clicks, no rubs, no murmurs ABD:  Flat, positive bowel sounds normal in frequency in pitch, no bruits, no rebound, no guarding, no midline pulsatile mass, no hepatomegaly, no splenomegaly EXT:  2 plus pulses throughout, no edema, no cyanosis no clubbing SKIN:  No rashes no nodules NEURO: Left hemiparesis and left facial droop PSYCH:  Cognitively intact, oriented to person place and time    EKG:  EKG is ordered today. The ekg ordered today demonstrates sinus rhythm, rate 66, axis within normal limits, intervals within normal limits, no acute ST-T wave changes.   Recent Labs: 10/17/2021: Magnesium 2.2; TSH 0.148 11/06/2021: ALT 16; BUN 12; Creatinine, Ser 0.59; Hemoglobin 15.2; Platelets 227; Potassium 3.3; Sodium 141    Lipid Panel    Component Value Date/Time   CHOL 262 (H) 11/03/2014 2055   TRIG 910 (H) 09/13/2017 0440   HDL 82  11/03/2014 2055   CHOLHDL 3.2 11/03/2014 2055   VLDL 25 11/03/2014 2055   LDLCALC 155 (H) 11/03/2014 2055      Wt Readings from Last 3 Encounters:  11/17/21 103 lb (46.7 kg)  11/08/21 104 lb (47.2 kg)  10/17/21 102 lb (46.3 kg)      Other studies Reviewed: Additional studies/ records that were reviewed today include: ED records x2. Review of the above records demonstrates:  Please see elsewhere in the note.  ASSESSMENT AND PLAN:  Atrial fibrillation: I think this was probably related to alcohol and may be in thyroid.  She is at high risk for anticoagulation and with this 1 event and previously normal echo I would not suggest further work-up.  I left a message for her primary provider to alert her to the TSH that came back from the ER.  The patient has had a thyroid scan that was unremarkable.  I am going to have her wear a 4-week monitor and if there is no evidence of atrial fibrillation then no further testing.  Hypertension: Her blood pressure is well controlled and she will continue the meds as listed.  Decreased TSH: As above I left a message for her primary provider to follow-up.  I discussed this with the patient.  EtOH: Medications that she does not drink routinely and this was a one-time episode.  She understands the association between alcohol and atrial fibrillation.  Hypokalemia: Her potassium was mildly low and improved from her ER visit early in August or later in August.  She can have this followed by her primary provider.  Current medicines are reviewed at length with the patient today.  The patient does not have concerns regarding medicines.  The following changes have been made:  no change  Labs/ tests ordered today include:  No orders of the defined types were placed in this encounter.    Disposition:   FU with me as needed based on the results of the above   Signed, Rollene Rotunda, MD  11/17/2021 2:07 PM    [facility]

## 2021-11-17 ENCOUNTER — Ambulatory Visit: Payer: Medicare HMO | Attending: Cardiology | Admitting: Cardiology

## 2021-11-17 ENCOUNTER — Other Ambulatory Visit: Payer: Self-pay | Admitting: Cardiology

## 2021-11-17 ENCOUNTER — Encounter: Payer: Self-pay | Admitting: Cardiology

## 2021-11-17 VITALS — BP 122/66 | HR 72 | Ht 61.0 in | Wt 103.0 lb

## 2021-11-17 DIAGNOSIS — I480 Paroxysmal atrial fibrillation: Secondary | ICD-10-CM | POA: Diagnosis not present

## 2021-11-17 DIAGNOSIS — I4891 Unspecified atrial fibrillation: Secondary | ICD-10-CM

## 2021-11-17 DIAGNOSIS — E785 Hyperlipidemia, unspecified: Secondary | ICD-10-CM

## 2021-11-17 DIAGNOSIS — I10 Essential (primary) hypertension: Secondary | ICD-10-CM

## 2021-11-17 DIAGNOSIS — R072 Precordial pain: Secondary | ICD-10-CM

## 2021-11-17 DIAGNOSIS — I471 Supraventricular tachycardia: Secondary | ICD-10-CM

## 2021-11-17 NOTE — Patient Instructions (Addendum)
Medication Instructions:  Continue same medications   Lab Work: None ordered   Testing/Procedures: 4 week Event Heart Monitor      Follow-Up: At [facility], you and your health needs are our priority.  As part of our continuing mission to provide you with exceptional heart care, we have created designated Provider Care Teams.  These Care Teams include your primary Cardiologist (physician) and Advanced Practice Providers (APPs -  Physician Assistants and Nurse Practitioners) who all work together to provide you with the care you need, when you need it.  We recommend signing up for the patient portal called "MyChart".  Sign up information is provided on this After Visit Summary.  MyChart is used to connect with patients for Virtual Visits (Telemedicine).  Patients are able to view lab/test results, encounter notes, upcoming appointments, etc.  Non-urgent messages can be sent to your provider as well.   To learn more about what you can do with MyChart, go to [email or web address].    Your next appointment:  As Needed    The format for your next appointment: Office   Provider:  Dr.Hochrein   Important Information About Sugar

## 2021-11-18 NOTE — Addendum Note (Signed)
Addended by: Raelyn Number on: 11/18/2021 04:12 PM   Modules accepted: Orders

## 2021-11-22 ENCOUNTER — Telehealth: Payer: Self-pay | Admitting: *Deleted

## 2021-11-22 NOTE — Telephone Encounter (Signed)
LMVM calling to reschedule monitor appointment.   Please call Carla Bowen in monitors at [phone].

## 2021-11-29 ENCOUNTER — Encounter: Payer: Self-pay | Admitting: Physical Medicine & Rehabilitation

## 2021-12-16 ENCOUNTER — Other Ambulatory Visit: Payer: Medicare HMO

## 2021-12-16 ENCOUNTER — Other Ambulatory Visit: Payer: Medicare HMO | Admitting: Obstetrics & Gynecology

## 2021-12-23 ENCOUNTER — Other Ambulatory Visit: Payer: Medicare HMO | Admitting: Obstetrics & Gynecology

## 2021-12-23 ENCOUNTER — Other Ambulatory Visit: Payer: Medicare HMO

## 2022-01-18 ENCOUNTER — Encounter: Payer: Self-pay | Admitting: Physical Medicine & Rehabilitation

## 2022-01-18 ENCOUNTER — Encounter: Payer: Medicare HMO | Attending: Physical Medicine & Rehabilitation | Admitting: Physical Medicine & Rehabilitation

## 2022-01-18 VITALS — BP 123/80 | HR 81 | Ht 61.0 in | Wt 105.2 lb

## 2022-01-18 DIAGNOSIS — F0631 Mood disorder due to known physiological condition with depressive features: Secondary | ICD-10-CM | POA: Insufficient documentation

## 2022-01-18 DIAGNOSIS — I69398 Other sequelae of cerebral infarction: Secondary | ICD-10-CM | POA: Diagnosis not present

## 2022-01-18 MED ORDER — DULOXETINE HCL 20 MG PO CPEP: 20.0000 mg | ORAL_CAPSULE | Freq: Every day | ORAL | 1 refills | Status: DC

## 2022-01-18 NOTE — Progress Notes (Signed)
Subjective:    Patient ID: Carla Bowen, female    DOB: 10/03/1955, 66 y.o.   MRN: [id number]  HPI  66 year old female with history of right thalamic hemorrhage in 2014.  Reviewed MRI from 2016 showing evidence of hemosiderin in right greater than left thalamus.  She does not have any symptoms on the right hemibody.  Her primary complaints are left-sided pain affecting upper lower limb as well as facial region.  She rates her pain as a 8/10 sharp burning stabbing tingling aching.  Her sleep is fair she is able to sleep with the gabapentin at night. She has a past medical history of significant for depression and even had suicidal ideation in 2018.  She states she has no suicidal ideation at the current time but she is down about her chronic pain. She has been seen by neurology Dr. Ermalene Postin.  Also seen by primary care Dr. Genelle Gather.  The patient is disabled from work.  She is able to drive.  She does need some help with meal prep and household duties as well as shopping. Meds tried  Namenda- caused blood pressure elevation Gabapentin 100mg  TID during the day and 600mg  qhs- night time dose helps her sleep but higher doses leads to daytime drowsiness Lyrica - "almost committed suicide" Tegretol- tried , not helpful Tramadol causes hives Codeine cause hives Duloxetine tried before CVA for depression Botox "approved " - but 300.00 co pay  PT- tried for balance issues but cannot afford 30.00 copay  PHQ 9 is 24 MRI HEAD WITHOUT CONTRAST   TECHNIQUE: Multiplanar, multiecho pulse sequences of the brain and surrounding structures were obtained without intravenous contrast.   COMPARISON:  Head CT without contrast 1346 hrs.   FINDINGS: The examination had to be discontinued prior to completion due to patient agitation/anxiety and refusal to continue. Only coronal T2 weighted imaging was not obtained.   Chronic right thalamic and posterior right corona radiata lacunar type infarct with  abundant hemosiderin. Chronic hemosiderin about the atrium of the right lateral ventricle. Chronic micro hemorrhage in the contralateral medial left thalamus.   No restricted diffusion or evidence of acute infarction. Major intracranial vascular flow voids are within normal limits.   There is mild Wallerian degeneration in the right brainstem. Elsewhere mild for age scattered cerebral white matter T2 and FLAIR hyperintensity. No midline shift, mass effect, evidence of mass lesion, ventriculomegaly, extra-axial collection or acute intracranial hemorrhage. Cervicomedullary junction and pituitary are within normal limits. Negative visualized cervical spine.   Normal bone marrow signal Visible internal auditory structures appear normal. Mastoids are clear.   Sphenoid sinus mucosal thickening. Other paranasal sinuses are clear. Orbit and scalp soft tissues appear normal.   IMPRESSION: 1.  No acute intracranial abnormality. 2. Chronic hemorrhagic infarct centered at the right thalamus. Chronic micro hemorrhage also in the left thalamus.     Electronically Signed   By: Genevie Ann M.D.   On: 11/03/2014 19:47    CLINICAL DATA:  66 year old female with left side neck pain radiating to the shoulder and arm with weakness and numbness.   EXAM: MRI CERVICAL SPINE WITHOUT CONTRAST   TECHNIQUE: Multiplanar, multisequence MR imaging of the cervical spine was performed. No intravenous contrast was administered.   COMPARISON:  Cervical spine radiographs 04/23/2021. Thyroid ultrasound 01/31/2020.   FINDINGS: Alignment: Straightening but mildly improved cervical lordosis compared to the February radiographs. Mild anterolisthesis only in the upper thoracic spine at T1-T2.   Vertebrae: Some degenerative endplate marrow signal changes  in the lower cervical spine. No marrow edema or evidence of acute osseous abnormality. Normal background bone marrow signal.   Cord: Normal.   Posterior  Fossa, vertebral arteries, paraspinal tissues: Cervicomedullary junction is within normal limits. Negative visible posterior fossa and grossly negative visible other brain parenchyma. Preserved major vascular flow voids in the neck. Bilateral cervical vertebral arteries are tortuous, the left appears somewhat dominant. Partially retropharyngeal course of both ICAs. Small right thyroid nodules This has been evaluated on previous imaging. (ref: J Am Coll Radiol. 2015 Feb;12(2): 143-50).Negative visible lung apices.   Disc levels:   C2-C3:  Mild facet hypertrophy. No convincing stenosis.   C3-C4:  Minor endplate spurring. No stenosis.   C4-C5: Minor disc bulging. Mild facet hypertrophy. Tortuous left vertebral artery in the left C5 neural foramen. No convincing stenosis.   C5-C6: Mild disc space loss, circumferential disc bulge, endplate spurring. Borderline to mild facet hypertrophy. No spinal stenosis. Mild C6 neural foraminal stenosis appears greater on right.   C6-C7: Mild disc space loss with circumferential disc bulge. Broad-based posterior component. Superimposed mild to moderate facet and ligament flavum hypertrophy. Only borderline spinal stenosis (series 2, image 7). No left foraminal stenosis. Mild right C7 foraminal stenosis.   C7-T1:  Mild facet and ligament flavum hypertrophy. No stenosis.   T1-T2: Mild anterolisthesis and posterior disc bulge or pseudo disc. Mild to moderate facet hypertrophy. No visible upper thoracic stenosis.   IMPRESSION: 1. Mild for age cervical spine degeneration with no acute osseous abnormality identified. 2. Disc degeneration primarily at C5-C6 and C6-C7. No significant spinal stenosis. Up to mild associated C6 and C7 neural foraminal stenosis.     Electronically Signed   By: Odessa Fleming M.D.   On: 08/23/2021 06:59   Pain Inventory Average Pain 8 Pain Right Now 8 My pain is sharp, burning, stabbing, tingling, and aching  In the  last 24 hours, has pain interfered with the following? General activity 8 Relation with others 8 Enjoyment of life 9 What TIME of day is your pain at its worst? evening Sleep (in general) Fair  Pain is worse with: walking, bending, sitting, inactivity, and standing Pain improves with: rest, heat/ice, pacing activities, and injections Relief from Meds: 5  walk without assistance walk with assistance ability to climb steps?  yes do you drive?  yes needs help with transfers  I need assistance with the following:  meal prep, household duties, and shopping  weakness numbness tremor tingling trouble walking dizziness confusion depression anxiety  Any changes since last visit?  no  Any changes since last visit?  no    Family History  Problem Relation Age of Onset   Aneurysm Mother        Brain aneurysm   Lung cancer Father    Hypertension Sister    Diabetes Brother    Heart attack Brother    Hypertension Brother    Social History   Socioeconomic History   Marital status: Divorced    Spouse name: Not on file   Number of children: 1   Years of education: Not on file   Highest education level: Not on file  Occupational History   Occupation: Disablity   Tobacco Use   Smoking status: Every Day    Packs/day: 0.50    Years: 20.00    Total pack years: 10.00    Types: Cigarettes   Smokeless tobacco: Never  Vaping Use   Vaping Use: Never used  Substance and Sexual Activity  Alcohol use: Not Currently    Alcohol/week: 10.0 standard drinks of alcohol    Types: 5 Cans of beer, 5 Shots of liquor per week    Comment: BAC was 247- last drink 08/2017   Drug use: No   Sexual activity: Not Currently  Other Topics Concern   Not on file  Social History Narrative   Patient is right handed and lives at home with family.Patient drinks caffinated drinks.   Social Determinants of Health   Financial Resource Strain: Not on file  Food Insecurity: Not on file   Transportation Needs: Not on file  Physical Activity: Not on file  Stress: Not on file  Social Connections: Not on file   Past Surgical History:  Procedure Laterality Date   CESAREAN SECTION     TRACHEOSTOMY     Hx of tracheostomy   Past Medical History:  Diagnosis Date   Anxiety    Aortic atherosclerosis (Gravity) 09/24/2017   COPD (chronic obstructive pulmonary disease) (HCC)    Depression    Fibromyalgia    GERD (gastroesophageal reflux disease)    High cholesterol    Hypertension    Stroke (HCC)    BP 123/80   Pulse 81   Ht 5\' 1"  (1.549 m)   Wt 105 lb 3.2 oz (47.7 kg)   LMP  (LMP Unknown)   SpO2 97%   BMI 19.88 kg/m   Opioid Risk Score:   Fall Risk Score:  `1  Depression screen [facility] 2/9     01/18/2022   12:54 PM  Depression screen PHQ 2/9  Decreased Interest 3  Down, Depressed, Hopeless 3  PHQ - 2 Score 6  Altered sleeping 2  Tired, decreased energy 3  Change in appetite 2  Feeling bad or failure about yourself  3  Trouble concentrating 3  Moving slowly or fidgety/restless 3  Suicidal thoughts 2  PHQ-9 Score 24  Difficult doing work/chores Very difficult     Review of Systems  All other systems reviewed and are negative.     Objective:   Physical Exam  Well-developed well-nourished female no acute distress Mood and affect are appropriate Speech without evidence of dysarthria or aphasia There is no evidence of field cut Extraocular motion is intact No evidence of facial droop Motor strength is 4/5 in the left deltoid bicep tricep grip hip flexor knee extensor ankle dorsiflexor Motor strength is 5/5 in the right deltoid bicep tricep grip hip flexor knee extensor ankle dorsiflexor Sensation is diminished left V1 V2 V3 left upper extremity and left lower limb. There is some hyperpathia to pinprick in the left hand. She ambulates without assistive device she does have some mild hyperextension at the knee with circumduction. Tone is normal in the  left upper and left lower limb       Assessment & Plan:  1.  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  She has tried numerous medications which either have not been beneficial or have caused some side effects that she could not tolerate. Per patient reports she has tried duloxetine prior to her stroke for the treatment of anxiety and depression but never for her poststroke pain issues.  She does have a elevated PDQ 9 score 24.  We will make referral to neuropsych.  Duloxetine may be helpful in this regard as well. I will see the patient back in 6 weeks we will start her on 20 mg/day and titrate up slowly She would like to  do some therapy and I think that would be helpful for her balance and overall strengthening. She does have financial limitations and have therefore discussed group aquatic exercise and swimming.  She does have silver sneakers program and she can go to either local [employer] or other fitness facility.  She states she will explore this and find somewhere close to her home.  She does have transportation

## 2022-01-18 NOTE — Patient Instructions (Signed)
[facility], or [employer] or water exercise classes

## 2022-01-27 ENCOUNTER — Other Ambulatory Visit: Payer: Medicare HMO | Admitting: Obstetrics & Gynecology

## 2022-01-27 ENCOUNTER — Other Ambulatory Visit: Payer: Medicare HMO

## 2022-01-31 ENCOUNTER — Telehealth: Payer: Self-pay | Admitting: Physical Medicine & Rehabilitation

## 2022-01-31 NOTE — Telephone Encounter (Signed)
Patient would like to speak to clinical or provider regarding medical advice on Cymbalta. She is experiencing exhausted and sleepy.

## 2022-02-02 ENCOUNTER — Encounter: Payer: Self-pay | Admitting: *Deleted

## 2022-02-07 ENCOUNTER — Other Ambulatory Visit: Payer: Self-pay | Admitting: Physical Medicine & Rehabilitation

## 2022-02-07 ENCOUNTER — Encounter: Payer: Self-pay | Admitting: Physical Medicine & Rehabilitation

## 2022-02-07 DIAGNOSIS — G890 Central pain syndrome: Secondary | ICD-10-CM

## 2022-02-07 MED ORDER — NORTRIPTYLINE HCL 10 MG PO CAPS: 10.0000 mg | ORAL_CAPSULE | Freq: Every day | ORAL | 1 refills | Status: DC

## 2022-03-01 ENCOUNTER — Encounter: Payer: Medicare HMO | Admitting: Physical Medicine & Rehabilitation

## 2022-03-03 ENCOUNTER — Other Ambulatory Visit: Payer: Self-pay | Admitting: Physical Medicine & Rehabilitation

## 2022-03-03 ENCOUNTER — Other Ambulatory Visit: Payer: Self-pay | Admitting: *Deleted

## 2022-03-11 ENCOUNTER — Encounter: Payer: Medicare HMO | Admitting: Physical Medicine & Rehabilitation

## 2022-04-15 ENCOUNTER — Encounter: Payer: Medicare HMO | Admitting: Physical Medicine & Rehabilitation

## 2022-04-21 ENCOUNTER — Encounter: Payer: Medicare HMO | Admitting: Physical Medicine & Rehabilitation

## 2022-06-04 ENCOUNTER — Other Ambulatory Visit: Payer: Self-pay | Admitting: Physical Medicine & Rehabilitation

## 2022-08-15 ENCOUNTER — Encounter: Payer: Medicare Other | Admitting: Psychology

## 2022-09-04 ENCOUNTER — Other Ambulatory Visit: Payer: Self-pay | Admitting: Physical Medicine & Rehabilitation

## 2023-05-08 ENCOUNTER — Other Ambulatory Visit: Payer: Self-pay | Admitting: Physician Assistant

## 2023-05-08 DIAGNOSIS — R946 Abnormal results of thyroid function studies: Secondary | ICD-10-CM

## 2023-05-16 ENCOUNTER — Ambulatory Visit
Admission: RE | Admit: 2023-05-16 | Discharge: 2023-05-16 | Disposition: A | Discharge status: Home / Self Care | Payer: Medicare Other | Source: Ambulatory Visit | Attending: Physician Assistant | Admitting: Physician Assistant

## 2023-05-16 DIAGNOSIS — R946 Abnormal results of thyroid function studies: Secondary | ICD-10-CM

## 2023-06-20 ENCOUNTER — Ambulatory Visit: Admitting: Podiatry

## 2023-07-18 ENCOUNTER — Ambulatory Visit: Admitting: Podiatry

## 2023-07-27 ENCOUNTER — Ambulatory Visit: Admitting: Podiatry

## 2023-07-27 DIAGNOSIS — M778 Other enthesopathies, not elsewhere classified: Secondary | ICD-10-CM

## 2023-10-03 ENCOUNTER — Other Ambulatory Visit (INDEPENDENT_AMBULATORY_CARE_PROVIDER_SITE_OTHER)

## 2023-10-03 ENCOUNTER — Ambulatory Visit (INDEPENDENT_AMBULATORY_CARE_PROVIDER_SITE_OTHER): Admitting: Orthopaedic Surgery

## 2023-10-03 ENCOUNTER — Encounter: Payer: Self-pay | Admitting: Orthopaedic Surgery

## 2023-10-03 DIAGNOSIS — M25552 Pain in left hip: Secondary | ICD-10-CM | POA: Diagnosis not present

## 2023-10-03 MED ORDER — METHYLPREDNISOLONE 4 MG PO TBPK: ORAL_TABLET | ORAL | 0 refills | Status: AC

## 2023-10-03 NOTE — Progress Notes (Signed)
 Office Visit Note   Patient: Carla Bowen           Date of Birth: [birth year]           MRN: [id number] Visit Date: 10/03/2023              Requested by: Forbes Harlene PARAS, PA-C [address] [place],  [place] [address] PCP: Burney Darice CROME, MD   Assessment & Plan: Visit Diagnoses:  1. Pain in left hip     Plan: History of Present Illness Carla Bowen is a 68 year old female with fibromyalgia who presents with chronic left buttock and lateral thigh pain.  She has experienced left buttock and lateral thigh pain for several years. A previous cortisone injection provided relief for seven days, but the exact location is unknown. A recent steroid injection in the buttock area at the beginning of the month lasted only three days. The pain is primarily in the buttock with some lateral hip involvement, and there is no groin pain. She has numbness and tingling in her foot due to a previous stroke, but no numbness or tingling runs down her leg.  Physical Exam MUSCULOSKELETAL: Pain on palpation of lateral hip and left SI and buttock region.  Baseline function of LLE.    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 Recent steroid injection provided limited relief. - Prescribe oral steroids for one week. - Refer to physical therapy for hip and back rehabilitation.  Degeneration of lumbar region Lumbar degeneration may contribute to referred pain. - Refer to physical therapy for back rehabilitation.  Follow-Up Instructions: No follow-ups on file.   Orders:  Orders Placed This Encounter  Procedures   XR Lumbar Spine 2-3 Views   XR HIP UNILAT W OR W/O PELVIS 2-3 VIEWS LEFT   Ambulatory referral to Physical Therapy   Meds ordered this encounter  Medications   methylPREDNISolone  (MEDROL  DOSEPAK) 4 MG TBPK tablet    Sig: Take as directed    Dispense:  21 tablet    Refill:  0       Procedures: No procedures performed   Clinical Data: No additional findings.   Subjective: Chief Complaint  Patient presents with   Left Hip - Pain    HPI  Review of Systems  Constitutional: Negative.   HENT: Negative.    Eyes: Negative.   Respiratory: Negative.    Cardiovascular: Negative.   Endocrine: Negative.   Musculoskeletal: Negative.   Neurological: Negative.   Hematological: Negative.   Psychiatric/Behavioral: Negative.    All other systems reviewed and are negative.    Objective: Vital Signs: LMP  (LMP Unknown)   Physical Exam Vitals and nursing note reviewed.  Constitutional:      Appearance: She is well-developed.  HENT:     Head: Normocephalic and atraumatic.  Pulmonary:     Effort: Pulmonary effort is normal.  Abdominal:     Palpations: Abdomen is soft.  Musculoskeletal:     Cervical back: Neck supple.  Skin:    General: Skin is warm.     Capillary Refill: Capillary refill takes less than 2 seconds.  Neurological:     Mental Status: She is alert and oriented to person, place, and time.  Psychiatric:        Behavior: Behavior normal.        Thought Content: Thought content normal.  Judgment: Judgment normal.     Ortho Exam  Specialty Comments:  No specialty comments available.  Imaging: XR HIP UNILAT W OR W/O PELVIS 2-3 VIEWS LEFT Result Date: 10/03/2023 Xrays of the pelvis and left hip show no acute or structure abnormalities.  XR Lumbar Spine 2-3 Views Result Date: 10/03/2023 X-rays of the lumbar spine show diffuse degenerative changes.  No acute findings.    PMFS History: Patient Active Problem List   Diagnosis Date Noted   Paroxysmal atrial fibrillation (HCC) 11/17/2021   Dyslipidemia 11/15/2021   COPD (chronic obstructive pulmonary disease) (HCC)    Viral upper respiratory infection 12/21/2019   COPD exacerbation (HCC) 12/21/2019   Carpal tunnel syndrome of left wrist 03/27/2019   Hypoxemia    Tracheostomy  status (HCC)    Alcohol abuse with intoxication (HCC) 09/24/2017   Alcohol withdrawal syndrome with complication, with unspecified complication (HCC) 09/24/2017   Macrocytic anemia 09/24/2017   Aortic atherosclerosis (HCC) 09/24/2017   Tracheostomy care (HCC) 09/23/2017   Tracheostomy in place [facility]) 09/23/2017   Acute encephalopathy 09/23/2017   MSSA (methicillin susceptible Staphylococcus aureus) pneumonia (HCC) 09/23/2017   Severe protein-calorie malnutrition (HCC) 09/23/2017   Acute respiratory failure (HCC)    Acute ischemic colitis (HCC)    Sepsis (HCC) 09/03/2017   Alcohol use disorder, severe, dependence (HCC) 06/19/2016   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/13/2016   Chest tightness    History of stroke    Left hemiparesis (HCC)    Stroke (HCC)    Chest pain 11/03/2014   Lower urinary tract infectious disease 11/03/2014   HTN (hypertension) 11/03/2014   Anxiety and depression 07/30/2014   Muscle spasticity 07/30/2014   ICH (intracerebral hemorrhage) (HCC) 05/30/2013   Spastic hemiplegia affecting nondominant side (HCC) 05/30/2013   Abnormality of gait 05/30/2013   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   Disturbance of skin sensation 05/30/2013   Past Medical History:  Diagnosis Date   Anxiety    Aortic atherosclerosis (HCC) 09/24/2017   COPD (chronic obstructive pulmonary disease) (HCC)    Depression    Fibromyalgia    GERD (gastroesophageal reflux disease)    High cholesterol    Hypertension    Stroke (HCC)     Family History  Problem Relation Age of Onset   Aneurysm Mother        Brain aneurysm   Lung cancer Father    Hypertension Sister    Diabetes Brother    Heart attack Brother    Hypertension Brother     Past Surgical History:  Procedure Laterality Date   CESAREAN SECTION     TRACHEOSTOMY     Hx of tracheostomy   Social History   Occupational History   Occupation: Disabl[employer]   Tobacco Use   Smoking status: Every Day    Current  packs/day: 0.50    Average packs/day: 0.5 packs/day for 20.0 years (10.0 ttl pk-yrs)    Types: Cigarettes   Smokeless tobacco: Never  Vaping Use   Vaping status: Never Used  Substance and Sexual Activity   Alcohol use: Not Currently    Alcohol/week: 10.0 standard drinks of alcohol    Types: 5 Cans of beer, 5 Shots of liquor per week    Comment: BAC was 247- last drink 08/2017   Drug use: No   Sexual activity: Not Currently

## 2023-10-11 ENCOUNTER — Other Ambulatory Visit (HOSPITAL_BASED_OUTPATIENT_CLINIC_OR_DEPARTMENT_OTHER): Payer: Self-pay

## 2023-10-11 DIAGNOSIS — Z780 Asymptomatic menopausal state: Secondary | ICD-10-CM

## 2023-10-12 ENCOUNTER — Other Ambulatory Visit: Payer: Self-pay

## 2023-10-12 DIAGNOSIS — Z1231 Encounter for screening mammogram for malignant neoplasm of breast: Secondary | ICD-10-CM

## 2023-10-22 NOTE — Therapy (Incomplete)
 OUTPATIENT PHYSICAL THERAPY THORACOLUMBAR EVALUATION   Patient Name: Carla Bowen MRN: [id number] DOB:30-Dec-1955, 68 y.o., female Today's Date: 10/22/2023  END OF SESSION:***   Past Medical History:  Diagnosis Date   Anxiety    Aortic atherosclerosis (HCC) 09/24/2017   COPD (chronic obstructive pulmonary disease) (HCC)    Depression    Fibromyalgia    GERD (gastroesophageal reflux disease)    High cholesterol    Hypertension    Stroke [facility])    Past Surgical History:  Procedure Laterality Date   CESAREAN SECTION     TRACHEOSTOMY     Hx of tracheostomy   Patient Active Problem List   Diagnosis Date Noted   Paroxysmal atrial fibrillation (HCC) 11/17/2021   Dyslipidemia 11/15/2021   COPD (chronic obstructive pulmonary disease) (HCC)    Viral upper respiratory infection 12/21/2019   COPD exacerbation (HCC) 12/21/2019   Carpal tunnel syndrome of left wrist 03/27/2019   Hypoxemia    Tracheostomy status (HCC)    Alcohol abuse with intoxication (HCC) 09/24/2017   Alcohol withdrawal syndrome with complication, with unspecified complication (HCC) 09/24/2017   Macrocytic anemia 09/24/2017   Aortic atherosclerosis (HCC) 09/24/2017   Tracheostomy care (HCC) 09/23/2017   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/23/2017   Acute encephalopathy 09/23/2017   MSSA (methicillin susceptible Staphylococcus aureus) pneumonia (HCC) 09/23/2017   Severe protein-calorie malnutrition (HCC) 09/23/2017   Acute respiratory failure (HCC)    Acute ischemic colitis (HCC)    Sepsis (HCC) 09/03/2017   Alcohol use disorder, severe, dependence (HCC) 06/19/2016   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/13/2016   Chest tightness    History of stroke    Left hemiparesis (HCC)    Stroke (HCC)    Chest pain 11/03/2014   Lower urinary tract infectious disease 11/03/2014   HTN (hypertension) 11/03/2014   Anxiety and depression 07/30/2014   Muscle spasticity 07/30/2014   ICH (intracerebral  hemorrhage) (HCC) 05/30/2013   Spastic hemiplegia affecting nondominant side (HCC) 05/30/2013   Abnormality of gait 05/30/2013   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  ONSET DATE: ***  SUBJECTIVE:                                                                                                                                                                                           SUBJECTIVE STATEMENT: ***  PERTINENT HISTORY:  ***  PAIN:  Are you having pain? Yes: NPRS scale: *** Pain location: *** Pain description: *** Aggravating factors: *** Relieving factors: ***  PRECAUTIONS: {Therapy precautions:24002}  RED FLAGS: {PT Red Flags:29287}   WEIGHT BEARING RESTRICTIONS: No  FALLS:  Has patient fallen in last 6 months? {fallsyesno:27318}  LIVING ENVIRONMENT: Lives with: {[facility] lives with:25569::lives with their family} Lives in: {Lives in:25570} Stairs: {opstairs:27293} Has following equipment at home: {Assistive devices:23999}  OCCUPATION: ***  PLOF: {PLOF:24004}  PATIENT GOALS: ***  NEXT MD VISIT: ***  OBJECTIVE:  Note: Objective measures were completed at Evaluation unless otherwise noted.  DIAGNOSTIC FINDINGS:  Imaging-  X-rays of the lumbar spine show diffuse degenerative changes.  No acute  findings.  Xrays of the pelvis and left hip show no acute or structure abnormalities.  PATIENT SURVEYS:  PSFS: THE PATIENT SPECIFIC FUNCTIONAL SCALE  Place score of 0-10 (0 = unable to perform activity and 10 = able to perform activity at the same level as before injury or problem)  Activity Date: 10/23/23         2.     3.     4.      Total Score ***      Total Score = Sum of activity  scores/number of activities  Minimally Detectable Change: 3 points (for single activity); 2 points (for average score)  [facility] (nd). The Patient Specific Functional Scale . Retrieved from [email or web address]   COGNITION: Overall cognitive status: Within functional limits for tasks assessed     SENSATION: {sensation:27233}  MUSCLE LENGTH: Hamstrings: Right *** deg; Left *** deg Debby test: Right *** deg; Left *** deg  POSTURE: {posture:25561}  PALPATION: ***  LUMBAR ROM:   AROM eval  Flexion   Extension   Right lateral flexion   Left lateral flexion   Right rotation   Left rotation    (Blank rows = not tested)  LOWER EXTREMITY ROM:     {AROM/PROM:27142}  Right eval Left eval  Hip flexion    Hip extension    Hip abduction    Hip adduction    Hip internal rotation    Hip external rotation    Knee flexion    Knee extension    Ankle dorsiflexion    Ankle plantarflexion    Ankle inversion    Ankle eversion     (Blank rows = not tested)  LOWER EXTREMITY MMT:    MMT Right eval Left eval  Hip flexion    Hip extension    Hip abduction    Hip adduction    Hip internal rotation    Hip external rotation    Knee flexion    Knee extension    Ankle dorsiflexion    Ankle plantarflexion    Ankle inversion    Ankle eversion     (Blank rows = not tested)  LUMBAR SPECIAL TESTS:  Straight leg raise test: {pos/neg:25243} and FABER test: {pos/neg:25243}  FUNCTIONAL TESTS:  5 times sit to stand: ***  GAIT: Distance walked: *** Assistive device utilized: {Assistive devices:23999} Level of assistance: {Levels of assistance:24026} Comments: ***  TREATMENT DATE: ***  PATIENT EDUCATION:  Education details: HEP Person educated: {Person educated:25204} Education  method: Explanation, Demonstration, Tactile cues, and Verbal cues Education comprehension: verbalized understanding, returned demonstration, verbal cues required, and tactile cues required  HOME EXERCISE PROGRAM: ***  ASSESSMENT:  CLINICAL IMPRESSION: Patient is a 68 y.o. female who was seen today for physical therapy evaluation and treatment for ***. She has***   .  She would benefit from skilled PT to address these deficits and return to pain free activities.  OBJECTIVE IMPAIRMENTS: {opptimpairments:25111}.   ACTIVITY LIMITATIONS: {activitylimitations:27494}  PARTICIPATION LIMITATIONS: {participationrestrictions:25113}  PERSONAL FACTORS: {Personal factors:25162} are also affecting patient's functional outcome.   REHAB POTENTIAL: {rehabpotential:25112}  CLINICAL DECISION MAKING: Stable/uncomplicated  EVALUATION COMPLEXITY: {Evaluation complexity:25115}   GOALS: Goals reviewed with patient? Yes  SHORT TERM GOALS: Target date: ***  Pt to be independent with HEP. Baseline: Goal status: INITIAL  2.  Decrease pain by 1 level. Baseline:  Goal status: INITIAL  LONG TERM GOALS: Target date: ***  Increase ROM Baseline:  Goal status: INITIAL  2.  Increase strength Baseline:  Goal status: INITIAL  3.  Decrease max pain to 2/10 with all activities. Baseline:  Goal status: INITIAL  4.  *** Baseline:  Goal status: INITIAL  5.  *** Baseline:  Goal status: INITIAL  6.  *** Baseline:  Goal status: INITIAL  PLAN:  PT FREQUENCY: {rehab frequency:25116}  PT DURATION: {rehab duration:25117}  PLANNED INTERVENTIONS: 97164- PT Re-evaluation, 97110-Therapeutic exercises, 97530- Therapeutic activity, 97112- Neuromuscular re-education, 97535- Self Care, 02859- Manual therapy, [id number]- Gait training, [phone]- Aquatic Therapy, [phone]- Electrical stimulation (manual), Patient/Family education, Balance training, Stair training, Joint mobilization, Cryotherapy, and Moist  heat.  PLAN FOR NEXT SESSION: ***   Burnard CHRISTELLA Meth, PT 10/22/2023, 11:10 AM

## 2023-10-23 ENCOUNTER — Ambulatory Visit

## 2023-10-31 ENCOUNTER — Ambulatory Visit
Admission: RE | Admit: 2023-10-31 | Discharge: 2023-10-31 | Disposition: A | Discharge status: Home / Self Care | Source: Ambulatory Visit

## 2023-10-31 DIAGNOSIS — Z1231 Encounter for screening mammogram for malignant neoplasm of breast: Secondary | ICD-10-CM

## 2023-11-07 NOTE — Therapy (Signed)
 OUTPATIENT PHYSICAL THERAPY THORACOLUMBAR EVALUATION   Patient Name: Carla Bowen MRN: [id number] DOB:[birth year], 68 y.o., female Today's Date: 11/08/2023  END OF SESSION:  PT End of Session - 11/08/23 1425     Visit Number 1    Number of Visits 9    Date for PT Re-Evaluation 01/03/24    PT Start Time 1300    PT Stop Time 1338    PT Time Calculation (min) 38 min    Activity Tolerance Patient tolerated treatment well    Behavior During Therapy [facility] for tasks assessed/performed          Past Medical History:  Diagnosis Date   Anxiety    Aortic atherosclerosis (HCC) 09/24/2017   COPD (chronic obstructive pulmonary disease) (HCC)    Depression    Fibromyalgia    GERD (gastroesophageal reflux disease)    High cholesterol    Hypertension    Stroke [facility])    Past Surgical History:  Procedure Laterality Date   CESAREAN SECTION     TRACHEOSTOMY     Hx of tracheostomy   Patient Active Problem List   Diagnosis Date Noted   Paroxysmal atrial fibrillation (HCC) 11/17/2021   Dyslipidemia 11/15/2021   COPD (chronic obstructive pulmonary disease) (HCC)    Viral upper respiratory infection 12/21/2019   COPD exacerbation (HCC) 12/21/2019   Carpal tunnel syndrome of left wrist 03/27/2019   Hypoxemia    Tracheostomy status (HCC)    Alcohol abuse with intoxication (HCC) 09/24/2017   Alcohol withdrawal syndrome with complication, with unspecified complication (HCC) 09/24/2017   Macrocytic anemia 09/24/2017   Aortic atherosclerosis (HCC) 09/24/2017   Tracheostomy care (HCC) 09/23/2017   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/2017   Acute encephalopathy 09/23/2017   MSSA (methicillin susceptible Staphylococcus aureus) pneumonia (HCC) 09/23/2017   Severe protein-calorie malnutrition (HCC) 09/23/2017   Acute respiratory failure (HCC)    Acute ischemic colitis (HCC)    Sepsis (HCC) 09/03/2017   Alcohol use disorder, severe, dependence (HCC) 06/19/2016   Adjustment disorder with  disturbance of emotion 04/13/2016   Chest tightness    History of stroke    Left hemiparesis (HCC)    Stroke (HCC)    Chest pain 11/03/2014   Lower urinary tract infectious disease 11/03/2014   HTN (hypertension) 11/03/2014   Anxiety and depression 07/30/2014   Muscle spasticity 07/30/2014   ICH (intracerebral hemorrhage) (HCC) 05/30/2013   Spastic hemiplegia affecting nondominant side (HCC) 05/30/2013   Abnormality of gait 05/30/2013   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  Last 1-2 years slow increase in pain in left buttuck area which affects walking, stairs and sitting.  Primary MD gave her a steroid shot last week which lasted  couple days.  Hx/o increased falls .  Feels like L foot rolls in.   PERTINENT HISTORY:  CVA affecting L side   PAIN:  Are you having pain? Yes: NPRS scale: 8/10 max Pain location: L buttuck  Pain description: sharp, achy Aggravating factors: sitting, standing, stairs Relieving factors: medication  PRECAUTIONS: Fall    WEIGHT BEARING RESTRICTIONS: No  FALLS:  Has patient fallen in last 6 months? Yes. Number of falls 5  LIVING ENVIRONMENT: Lives with: lives alone Lives in: Mobile home Stairs: Yes: External: 5 steps; on left going up Has following equipment at home: None  OCCUPATION: not working  PLOF:  Independent  PATIENT GOALS: decrease pain  NEXT MD VISIT: unknown  OBJECTIVE:  Note: Objective measures were completed at Evaluation unless otherwise noted.  DIAGNOSTIC FINDINGS:  Imaging-  X-rays of the lumbar spine show diffuse degenerative changes.  No acute  findings.  Xrays of the pelvis and left hip show no acute or structure abnormalities.  PATIENT SURVEYS:  PSFS: THE PATIENT SPECIFIC FUNCTIONAL SCALE  Place score of 0-10 (0 = unable to perform activity and 10 = able to perform activity at the same level as before injury or problem)  Activity Date: 10/23/23    walking 7    2.stairs 5    3.sitting >30 min 3    4.      Total Score 5      Total Score = Sum of activity scores/number of activities  Minimally Detectable Change: 3 points (for single activity); 2 points (for average score)  [facility] (nd). The Patient Specific Functional Scale . Retrieved from [email or web address]   COGNITION: Overall cognitive status: Within functional limits for tasks assessed     SENSATION: Hypersensitive in L TFL area.  Pt reports she has a diagnosis of Fibromyalgia making her sensitive.  MUSCLE LENGTH: Hamstrings: Right 90 deg; Left 70 deg  POSTURE: L scap winging, L UE flexed, flattened lumbar lordosis  PALPATION: 2+ tenderness at L TFL and GT  LUMBAR ROM:   AROM eval  Flexion 50%  Extension 25%  Right lateral flexion 75%  Left lateral flexion 100%  Right rotation 75%  Left rotation 100%   (End range pain throughout)  LOWER EXTREMITY ROM:     A/P Right eval Left eval  Hip flexion  75 Pain  Hip extension    Hip abduction    Hip adduction    Hip internal rotation  WFL  Hip external rotation  [facility]  Knee flexion    Knee extension    Ankle dorsiflexion    Ankle plantarflexion    Ankle inversion    Ankle eversion     (Blank rows = not tested)  LOWER EXTREMITY MMT:    MMT Right eval  Left eval  Hip flexion 4+ 4  Hip extension    Hip abduction 4+ 4-  Hip adduction 4+ 4  Hip internal rotation    Hip external rotation    Knee flexion    Knee extension 5- 4  Ankle dorsiflexion    Ankle plantarflexion 4+ 4+  Ankle inversion    Ankle eversion     (Blank rows = not tested)  LUMBAR SPECIAL TESTS:  Straight leg raise test: Negative and FABER  test: Positive  FUNCTIONAL TESTS: not completed  GAIT: Distance walked: community Assistive device utilized: None Level of assistance: Complete Independence Comments: Altered gait with no AD due to remaining spastcity on the left  TREATMENT DATE:  11/08/23 Evaluation completed f/b percussive device work at L gluteal and instruction in HEP.                                                                                                                                PATIENT EDUCATION:  Education details: HEP Person educated: Patient Education method: Explanation, Demonstration, Actor cues, and Verbal cues Education comprehension: verbalized understanding, returned demonstration, verbal cues required, and tactile cues required  HOME EXERCISE PROGRAM: Access Code: [email or web address] URL: [email or web address] Date: 11/08/2023 Prepared by: Burnard Meth  Exercises - Lower Trunk Rotations  - 2 x daily - 7 x weekly - 2 sets - 10 reps - Supine Single Knee to Chest Stretch  - 2 x daily - 7 x weekly - 1 sets - 3 reps - 25 hold - Seated Piriformis Stretch with Trunk Bend  - 2 x daily - 7 x weekly - 1 sets - 3 reps - 25 hold - Supine Figure 4 Piriformis Stretch  - 2 x daily - 7 x weekly - 1 sets - 3 reps - 25 hold  ASSESSMENT:  CLINICAL IMPRESSION: Patient is a 68 y.o. female who was seen today for physical therapy evaluation and treatment for L buttuck pain restricting her sitting, standing and walking. She presents with pain, altered gait, decreased strength in LE, decreased ROM  and decreased flexibility.  She would  benefit from skilled PT to address these deficits and return to pain free activities.  OBJECTIVE IMPAIRMENTS: Abnormal gait, decreased activity tolerance, decreased balance, decreased endurance, difficulty walking, decreased ROM, decreased strength, impaired flexibility, and pain.   ACTIVITY LIMITATIONS: bending, sitting, standing, squatting, stairs, and transfers  PARTICIPATION LIMITATIONS: community activity and yard work  PERSONAL FACTORS: 1 comorbidity: CVA are also affecting patient's functional outcome.   REHAB POTENTIAL: Fair see above  CLINICAL DECISION MAKING: Stable/uncomplicated  EVALUATION COMPLEXITY: Moderate   GOALS: Goals reviewed with patient? Yes  SHORT TERM GOALS: Target date: 12/06/2023    Pt to be independent with HEP. Baseline: Goal status: INITIAL  2.  Decrease pain by 1 level. Baseline:  Goal status: INITIAL  LONG TERM GOALS: Target date: 01/03/2024    Increase ROM by 25%. Baseline:  Goal status: INITIAL  2.  Increase strength by at least a 1/2 grade. Baseline:  Goal status: INITIAL  3.  Decrease max pain to 2/10 with all activities. Baseline:  Goal status: INITIAL  4.  Increase PSFS score by 2 to show improved functional activities. Baseline:  Goal status: INITIAL PLAN:  PT FREQUENCY: 1-2x/week  PT DURATION: 8 weeks  PLANNED INTERVENTIONS: 97164- PT Re-evaluation, 97110-Therapeutic exercises, 97530- Therapeutic activity, [id number]- Neuromuscular re-education, 97535- Self Care, 02859- Manual therapy, [id number]- Gait training, [phone]- Aquatic Therapy,  02967- Electrical stimulation (manual), Patient/Family education, Balance training, Stair training, Joint mobilization, Cryotherapy, and Moist heat.  PLAN FOR NEXT SESSION: Balance assessment   Burnard CHRISTELLA Meth, PT 11/08/2023, 2:26 PM

## 2023-11-08 ENCOUNTER — Other Ambulatory Visit: Payer: Self-pay

## 2023-11-08 ENCOUNTER — Ambulatory Visit (INDEPENDENT_AMBULATORY_CARE_PROVIDER_SITE_OTHER)

## 2023-11-08 DIAGNOSIS — M25552 Pain in left hip: Secondary | ICD-10-CM

## 2023-11-08 DIAGNOSIS — R2689 Other abnormalities of gait and mobility: Secondary | ICD-10-CM

## 2023-11-08 DIAGNOSIS — M25652 Stiffness of left hip, not elsewhere classified: Secondary | ICD-10-CM | POA: Diagnosis not present

## 2023-11-27 NOTE — Therapy (Incomplete)
 OUTPATIENT PHYSICAL THERAPY THORACOLUMBAR TREATMENT  Patient Name: Carla Bowen MRN: [id number] DOB:1955/09/15, 68 y.o., female Today's Date: 11/27/2023  END OF SESSION:***    Past Medical History:  Diagnosis Date   Anxiety    Aortic atherosclerosis (HCC) 09/24/2017   COPD (chronic obstructive pulmonary disease) (HCC)    Depression    Fibromyalgia    GERD (gastroesophageal reflux disease)    High cholesterol    Hypertension    Stroke [facility])    Past Surgical History:  Procedure Laterality Date   CESAREAN SECTION     TRACHEOSTOMY     Hx of tracheostomy   Patient Active Problem List   Diagnosis Date Noted   Paroxysmal atrial fibrillation (HCC) 11/17/2021   Dyslipidemia 11/15/2021   COPD (chronic obstructive pulmonary disease) (HCC)    Viral upper respiratory infection 12/21/2019   COPD exacerbation (HCC) 12/21/2019   Carpal tunnel syndrome of left wrist 03/27/2019   Hypoxemia    Tracheostomy status (HCC)    Alcohol abuse with intoxication (HCC) 09/24/2017   Alcohol withdrawal syndrome with complication, with unspecified complication (HCC) 09/24/2017   Macrocytic anemia 09/24/2017   Aortic atherosclerosis (HCC) 09/24/2017   Tracheostomy care (HCC) 09/23/2017   Tracheostomy in place [facility]) 09/23/2017   Acute encephalopathy 09/23/2017   MSSA (methicillin susceptible Staphylococcus aureus) pneumonia (HCC) 09/23/2017   Severe protein-calorie malnutrition (HCC) 09/23/2017   Acute respiratory failure (HCC)    Acute ischemic colitis (HCC)    Sepsis (HCC) 09/03/2017   Alcohol use disorder, severe, dependence (HCC) 06/19/2016   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/13/2016   Chest tightness    History of stroke    Left hemiparesis (HCC)    Stroke (HCC)    Chest pain 11/03/2014   Lower urinary tract infectious disease 11/03/2014   HTN (hypertension) 11/03/2014   Anxiety and depression 07/30/2014   Muscle spasticity 07/30/2014   ICH (intracerebral  hemorrhage) (HCC) 05/30/2013   Spastic hemiplegia affecting nondominant side (HCC) 05/30/2013   Abnormality of gait 05/30/2013   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  ONSET DATE: inc over last year  SUBJECTIVE:                                                                                                                                                                                           SUBJECTIVE STATEMENT: ***Pt had CVA  2014 affecting L side.  Last 1-2 years slow increase in pain in left buttuck area which affects walking, stairs and sitting.  Primary MD gave her a steroid shot last week which lasted  couple days.  Hx/o increased falls .  Feels like L foot rolls in.   PERTINENT HISTORY:  CVA affecting L side   PAIN:  ***Are you having pain? Yes: NPRS scale: 8/10 max Pain location: L buttuck  Pain description: sharp, achy Aggravating factors: sitting, standing, stairs Relieving factors: medication  PRECAUTIONS: Fall    WEIGHT BEARING RESTRICTIONS: No  FALLS:  Has patient fallen in last 6 months? Yes. Number of falls 5  LIVING ENVIRONMENT: Lives with: lives alone Lives in: Mobile home Stairs: Yes: External: 5 steps; on left going up Has following equipment at home: None  OCCUPATION: not working  PLOF: Independent  PATIENT GOALS: decrease pain  NEXT MD VISIT: unknown  OBJECTIVE:  Note: Objective measures were completed at Evaluation unless otherwise noted.  DIAGNOSTIC FINDINGS:  Imaging-  X-rays of the lumbar spine show diffuse degenerative changes.  No acute  findings.  Xrays of the pelvis and left hip show no acute or structure abnormalities.  PATIENT SURVEYS:  PSFS: THE PATIENT  SPECIFIC FUNCTIONAL SCALE  Place score of 0-10 (0 = unable to perform activity and 10 = able to perform activity at the same level as before injury or problem)  Activity Date: 10/23/23    walking 7    2.stairs 5    3.sitting >30 min 3    4.      Total Score 5      Total Score = Sum of activity scores/number of activities  Minimally Detectable Change: 3 points (for single activity); 2 points (for average score)  [facility] (nd). The Patient Specific Functional Scale . Retrieved from [email or web address]   COGNITION: Overall cognitive status: Within functional limits for tasks assessed     SENSATION: Hypersensitive in L TFL area.  Pt reports she has a diagnosis of Fibromyalgia making her sensitive.  MUSCLE LENGTH: Hamstrings: Right 90 deg; Left 70 deg  POSTURE: L scap winging, L UE flexed, flattened lumbar lordosis  PALPATION: 2+ tenderness at L TFL and GT  LUMBAR ROM:   AROM eval  Flexion 50%  Extension 25%  Right lateral flexion 75%  Left lateral flexion 100%  Right rotation 75%  Left rotation 100%   (End range pain throughout)  LOWER EXTREMITY ROM:     A/P Right eval Left eval  Hip flexion  75 Pain  Hip extension    Hip abduction    Hip adduction    Hip internal rotation  WFL  Hip external rotation  [facility]  Knee flexion    Knee extension    Ankle dorsiflexion    Ankle plantarflexion    Ankle inversion    Ankle eversion     (Blank rows = not tested)  LOWER EXTREMITY MMT:    MMT Right eval Left eval  Hip flexion 4+ 4  Hip extension    Hip abduction 4+ 4-  Hip adduction 4+ 4  Hip internal rotation    Hip external rotation    Knee flexion    Knee extension 5- 4  Ankle dorsiflexion    Ankle plantarflexion 4+ 4+  Ankle inversion    Ankle eversion     (Blank rows = not tested)  LUMBAR SPECIAL TESTS:  Straight leg raise test: Negative and FABER test: Positive  FUNCTIONAL TESTS:  not completed  GAIT: Distance walked: Orthoptist utilized: None Level of assistance: Complete Independence Comments: Altered gait with no AD due to remaining spastcity on the left  TREATMENT DATE:  11/27/23***     11/08/23 Evaluation completed f/b percussive device work at L gluteal and instruction in HEP.                                                                                                                                PATIENT EDUCATION:  Education details: HEP Person educated: Patient Education method: Explanation, Demonstration, Actor cues, and Verbal cues Education comprehension: verbalized understanding, returned demonstration, verbal cues required, and tactile cues required  HOME EXERCISE PROGRAM: Access Code: [email or web address] URL: [email or web address] Date: 11/08/2023 Prepared by: Burnard Meth  Exercises - Lower Trunk Rotations  - 2 x daily - 7 x weekly - 2 sets - 10 reps - Supine Single Knee to Chest Stretch  - 2 x daily - 7 x weekly - 1 sets - 3 reps - 25 hold - Seated Piriformis Stretch with Trunk Bend  - 2 x daily - 7 x weekly - 1 sets - 3 reps - 25 hold - Supine Figure 4 Piriformis Stretch  - 2 x daily - 7 x weekly - 1 sets - 3 reps - 25 hold  ASSESSMENT:  CLINICAL IMPRESSION: ***Patient is a 68 y.o. female who was seen today for physical therapy evaluation and treatment for L buttuck pain restricting her sitting, standing and walking. She presents with pain, altered gait, decreased strength in LE, decreased ROM  and decreased flexibility.  She would benefit from skilled PT to address these deficits and return to pain free activities.  OBJECTIVE IMPAIRMENTS: Abnormal gait, decreased activity tolerance, decreased balance, decreased endurance, difficulty walking, decreased ROM, decreased strength, impaired flexibility, and pain.   ACTIVITY LIMITATIONS: bending, sitting, standing, squatting, stairs, and transfers  PARTICIPATION  LIMITATIONS: community activity and yard work  PERSONAL FACTORS: 1 comorbidity: CVA are also affecting patient's functional outcome.   REHAB POTENTIAL: Fair see above  CLINICAL DECISION MAKING: Stable/uncomplicated  EVALUATION COMPLEXITY: Moderate   GOALS: Goals reviewed with patient? Yes  SHORT TERM GOALS: Target date: 12/06/2023    Pt to be independent with HEP. Baseline: Goal status: INITIAL  2.  Decrease pain by 1 level. Baseline:  Goal status: INITIAL  LONG TERM GOALS: Target date: 01/03/2024    Increase ROM by 25%. Baseline:  Goal status: INITIAL  2.  Increase strength by at least a 1/2 grade. Baseline:  Goal status: INITIAL  3.  Decrease max pain to 2/10 with all activities. Baseline:  Goal status: INITIAL  4.  Increase PSFS score by 2 to show improved functional activities. Baseline:  Goal status: INITIAL PLAN:  PT FREQUENCY: 1-2x/week  PT DURATION: 8 weeks  PLANNED INTERVENTIONS: 97164- PT Re-evaluation, 97110-Therapeutic exercises, 97530- Therapeutic activity, [id number]- Neuromuscular re-education, 97535- Self Care, [id number]- Manual therapy, [id number]- Gait training, [phone]- Aquatic Therapy,  [id number]- Electrical stimulation (manual), Patient/Family education, Balance training, Stair training, Joint mobilization, Cryotherapy, and Moist heat.  PLAN FOR NEXT SESSION: ***Balance assessment   Burnard CHRISTELLA Meth, PT 11/27/2023, 7:56 AM

## 2023-11-28 ENCOUNTER — Encounter

## 2023-12-04 NOTE — Therapy (Incomplete)
 OUTPATIENT PHYSICAL THERAPY THORACOLUMBAR TREATMENT  Patient Name: Carla Bowen MRN: [id number] DOB:September 25, 1955, 68 y.o., female Today's Date: 12/04/2023  END OF SESSION:***    Past Medical History:  Diagnosis Date   Anxiety    Aortic atherosclerosis (HCC) 09/24/2017   COPD (chronic obstructive pulmonary disease) (HCC)    Depression    Fibromyalgia    GERD (gastroesophageal reflux disease)    High cholesterol    Hypertension    Stroke [facility])    Past Surgical History:  Procedure Laterality Date   CESAREAN SECTION     TRACHEOSTOMY     Hx of tracheostomy   Patient Active Problem List   Diagnosis Date Noted   Paroxysmal atrial fibrillation (HCC) 11/17/2021   Dyslipidemia 11/15/2021   COPD (chronic obstructive pulmonary disease) (HCC)    Viral upper respiratory infection 12/21/2019   COPD exacerbation (HCC) 12/21/2019   Carpal tunnel syndrome of left wrist 03/27/2019   Hypoxemia    Tracheostomy status (HCC)    Alcohol abuse with intoxication (HCC) 09/24/2017   Alcohol withdrawal syndrome with complication, with unspecified complication (HCC) 09/24/2017   Macrocytic anemia 09/24/2017   Aortic atherosclerosis (HCC) 09/24/2017   Tracheostomy care (HCC) 09/23/2017   Tracheostomy in place [facility]) 09/23/2017   Acute encephalopathy 09/23/2017   MSSA (methicillin susceptible Staphylococcus aureus) pneumonia (HCC) 09/23/2017   Severe protein-calorie malnutrition (HCC) 09/23/2017   Acute respiratory failure (HCC)    Acute ischemic colitis (HCC)    Sepsis (HCC) 09/03/2017   Alcohol use disorder, severe, dependence (HCC) 06/19/2016   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/13/2016   Chest tightness    History of stroke    Left hemiparesis (HCC)    Stroke (HCC)    Chest pain 11/03/2014   Lower urinary tract infectious disease 11/03/2014   HTN (hypertension) 11/03/2014   Anxiety and depression 07/30/2014   Muscle spasticity 07/30/2014   ICH (intracerebral  hemorrhage) (HCC) 05/30/2013   Spastic hemiplegia affecting nondominant side (HCC) 05/30/2013   Abnormality of gait 05/30/2013   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  ONSET DATE: inc over last year  SUBJECTIVE:                                                                                                                                                                                           SUBJECTIVE STATEMENT: ***Pt had CVA  2014 affecting L side.  Last 1-2 years slow increase in pain in left buttuck area which affects walking, stairs and sitting.  Primary MD gave her a steroid shot last week which lasted  couple days.  Hx/o increased falls .  Feels like L foot rolls in.   PERTINENT HISTORY:  CVA affecting L side   PAIN:  ***Are you having pain? Yes: NPRS scale: 8/10 max Pain location: L buttuck  Pain description: sharp, achy Aggravating factors: sitting, standing, stairs Relieving factors: medication  PRECAUTIONS: Fall    WEIGHT BEARING RESTRICTIONS: No  FALLS:  Has patient fallen in last 6 months? Yes. Number of falls 5  LIVING ENVIRONMENT: Lives with: lives alone Lives in: Mobile home Stairs: Yes: External: 5 steps; on left going up Has following equipment at home: None  OCCUPATION: not working  PLOF: Independent  PATIENT GOALS: decrease pain  NEXT MD VISIT: unknown  OBJECTIVE:  Note: Objective measures were completed at Evaluation unless otherwise noted.  DIAGNOSTIC FINDINGS:  Imaging-  X-rays of the lumbar spine show diffuse degenerative changes.  No acute  findings.  Xrays of the pelvis and left hip show no acute or structure abnormalities.  PATIENT SURVEYS:  PSFS: THE PATIENT  SPECIFIC FUNCTIONAL SCALE  Place score of 0-10 (0 = unable to perform activity and 10 = able to perform activity at the same level as before injury or problem)  Activity Date: 10/23/23    walking 7    2.stairs 5    3.sitting >30 min 3    4.      Total Score 5      Total Score = Sum of activity scores/number of activities  Minimally Detectable Change: 3 points (for single activity); 2 points (for average score)  [facility] (nd). The Patient Specific Functional Scale . Retrieved from [email or web address]   COGNITION: Overall cognitive status: Within functional limits for tasks assessed     SENSATION: Hypersensitive in L TFL area.  Pt reports she has a diagnosis of Fibromyalgia making her sensitive.  MUSCLE LENGTH: Hamstrings: Right 90 deg; Left 70 deg  POSTURE: L scap winging, L UE flexed, flattened lumbar lordosis  PALPATION: 2+ tenderness at L TFL and GT  LUMBAR ROM:   AROM eval  Flexion 50%  Extension 25%  Right lateral flexion 75%  Left lateral flexion 100%  Right rotation 75%  Left rotation 100%   (End range pain throughout)  LOWER EXTREMITY ROM:     A/P Right eval Left eval  Hip flexion  75 Pain  Hip extension    Hip abduction    Hip adduction    Hip internal rotation  WFL  Hip external rotation  [facility]  Knee flexion    Knee extension    Ankle dorsiflexion    Ankle plantarflexion    Ankle inversion    Ankle eversion     (Blank rows = not tested)  LOWER EXTREMITY MMT:    MMT Right eval Left eval  Hip flexion 4+ 4  Hip extension    Hip abduction 4+ 4-  Hip adduction 4+ 4  Hip internal rotation    Hip external rotation    Knee flexion    Knee extension 5- 4  Ankle dorsiflexion    Ankle plantarflexion 4+ 4+  Ankle inversion    Ankle eversion     (Blank rows = not tested)  LUMBAR SPECIAL TESTS:  Straight leg raise test: Negative and FABER test: Positive  FUNCTIONAL TESTS:  not completed  GAIT: Distance walked: Orthoptist utilized: None Level of assistance: Complete Independence Comments: Altered gait with no AD due to remaining spastcity on the left  TREATMENT DATE:  11/27/23***     11/08/23 Evaluation completed f/b percussive device work at L gluteal and instruction in HEP.                                                                                                                                PATIENT EDUCATION:  Education details: HEP Person educated: Patient Education method: Explanation, Demonstration, Actor cues, and Verbal cues Education comprehension: verbalized understanding, returned demonstration, verbal cues required, and tactile cues required  HOME EXERCISE PROGRAM: Access Code: [email or web address] URL: [email or web address] Date: 11/08/2023 Prepared by: Burnard Meth  Exercises - Lower Trunk Rotations  - 2 x daily - 7 x weekly - 2 sets - 10 reps - Supine Single Knee to Chest Stretch  - 2 x daily - 7 x weekly - 1 sets - 3 reps - 25 hold - Seated Piriformis Stretch with Trunk Bend  - 2 x daily - 7 x weekly - 1 sets - 3 reps - 25 hold - Supine Figure 4 Piriformis Stretch  - 2 x daily - 7 x weekly - 1 sets - 3 reps - 25 hold  ASSESSMENT:  CLINICAL IMPRESSION: ***Patient is a 68 y.o. female who was seen today for physical therapy evaluation and treatment for L buttuck pain restricting her sitting, standing and walking. She presents with pain, altered gait, decreased strength in LE, decreased ROM  and decreased flexibility.  She would benefit from skilled PT to address these deficits and return to pain free activities.  OBJECTIVE IMPAIRMENTS: Abnormal gait, decreased activity tolerance, decreased balance, decreased endurance, difficulty walking, decreased ROM, decreased strength, impaired flexibility, and pain.   ACTIVITY LIMITATIONS: bending, sitting, standing, squatting, stairs, and transfers  PARTICIPATION  LIMITATIONS: community activity and yard work  PERSONAL FACTORS: 1 comorbidity: CVA are also affecting patient's functional outcome.   REHAB POTENTIAL: Fair see above  CLINICAL DECISION MAKING: Stable/uncomplicated  EVALUATION COMPLEXITY: Moderate   GOALS: Goals reviewed with patient? Yes  SHORT TERM GOALS: Target date: 12/06/2023***    Pt to be independent with HEP. Baseline: Goal status: INITIAL  2.  Decrease pain by 1 level. Baseline:  Goal status: INITIAL  LONG TERM GOALS: Target date: 01/03/2024    Increase ROM by 25%. Baseline:  Goal status: INITIAL  2.  Increase strength by at least a 1/2 grade. Baseline:  Goal status: INITIAL  3.  Decrease max pain to 2/10 with all activities. Baseline:  Goal status: INITIAL  4.  Increase PSFS score by 2 to show improved functional activities. Baseline:  Goal status: INITIAL PLAN:  PT FREQUENCY: 1-2x/week  PT DURATION: 8 weeks  PLANNED INTERVENTIONS: 97164- PT Re-evaluation, 97110-Therapeutic exercises, 97530- Therapeutic activity, [id number]- Neuromuscular re-education, 97535- Self Care, 02859- Manual therapy, [id number]- Gait training, [phone]- Aquatic Therapy,  02967- Electrical stimulation (manual), Patient/Family education, Balance training, Stair training, Joint mobilization, Cryotherapy, and Moist heat.  PLAN FOR NEXT SESSION: ***Balance assessment   Burnard CHRISTELLA Meth, PT 12/04/2023, 7:56 AM

## 2023-12-05 ENCOUNTER — Encounter

## 2023-12-11 NOTE — Therapy (Incomplete)
 OUTPATIENT PHYSICAL THERAPY THORACOLUMBAR TREATMENT  Patient Name: Carla Bowen MRN: [id number] DOB:December 11, 1955, 68 y.o., female Today's Date: 12/11/2023  END OF SESSION:***    Past Medical History:  Diagnosis Date   Anxiety    Aortic atherosclerosis 09/24/2017   COPD (chronic obstructive pulmonary disease) (HCC)    Depression    Fibromyalgia    GERD (gastroesophageal reflux disease)    High cholesterol    Hypertension    Stroke [facility])    Past Surgical History:  Procedure Laterality Date   CESAREAN SECTION     TRACHEOSTOMY     Hx of tracheostomy   Patient Active Problem List   Diagnosis Date Noted   Paroxysmal atrial fibrillation (HCC) 11/17/2021   Dyslipidemia 11/15/2021   COPD (chronic obstructive pulmonary disease) (HCC)    Viral upper respiratory infection 12/21/2019   COPD exacerbation (HCC) 12/21/2019   Carpal tunnel syndrome of left wrist 03/27/2019   Hypoxemia    Tracheostomy status (HCC)    Alcohol abuse with intoxication 09/24/2017   Alcohol withdrawal syndrome with complication, with unspecified complication (HCC) 09/24/2017   Macrocytic anemia 09/24/2017   Aortic atherosclerosis 09/24/2017   Tracheostomy care (HCC) 09/23/2017   Tracheostomy in place [facility]) 09/23/2017   Acute encephalopathy 09/23/2017   MSSA (methicillin susceptible Staphylococcus aureus) pneumonia (HCC) 09/23/2017   Severe protein-calorie malnutrition 09/23/2017   Acute respiratory failure (HCC)    Acute ischemic colitis    Sepsis (HCC) 09/03/2017   Alcohol use disorder, severe, dependence (HCC) 06/19/2016   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/13/2016   Chest tightness    History of stroke    Left hemiparesis (HCC)    Stroke (HCC)    Chest pain 11/03/2014   Lower urinary tract infectious disease 11/03/2014   HTN (hypertension) 11/03/2014   Anxiety and depression 07/30/2014   Muscle spasticity 07/30/2014   ICH (intracerebral hemorrhage) (HCC) 05/30/2013    Spastic hemiplegia affecting nondominant side (HCC) 05/30/2013   Abnormality of gait 05/30/2013   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  ONSET DATE: inc over last year  SUBJECTIVE:                                                                                                                                                                                           SUBJECTIVE STATEMENT: ***Pt had CVA 2014 affecting L side.  Last 1-2 years slow increase in pain in left buttuck area which affects walking, stairs and sitting.  Primary MD gave her a steroid shot last week which lasted  couple days.  Hx/o increased falls .  Feels like L foot rolls in.   PERTINENT HISTORY:  CVA affecting L side   PAIN:  ***Are you having pain? Yes: NPRS scale: 8/10 max Pain location: L buttuck  Pain description: sharp, achy Aggravating factors: sitting, standing, stairs Relieving factors: medication  PRECAUTIONS: Fall    WEIGHT BEARING RESTRICTIONS: No  FALLS:  Has patient fallen in last 6 months? Yes. Number of falls 5  LIVING ENVIRONMENT: Lives with: lives alone Lives in: Mobile home Stairs: Yes: External: 5 steps; on left going up Has following equipment at home: None  OCCUPATION: not working  PLOF: Independent  PATIENT GOALS: decrease pain  NEXT MD VISIT: unknown  OBJECTIVE:  Note: Objective measures were completed at Evaluation unless otherwise noted.  DIAGNOSTIC FINDINGS:  Imaging-  X-rays of the lumbar spine show diffuse degenerative changes.  No acute  findings.  Xrays of the pelvis and left hip show no acute or structure abnormalities.  PATIENT SURVEYS:  PSFS: THE PATIENT SPECIFIC FUNCTIONAL SCALE  Place  score of 0-10 (0 = unable to perform activity and 10 = able to perform activity at the same level as before injury or problem)  Activity Date: 10/23/23    walking 7    2.stairs 5    3.sitting >30 min 3    4.      Total Score 5      Total Score = Sum of activity scores/number of activities  Minimally Detectable Change: 3 points (for single activity); 2 points (for average score)  [facility] (nd). The Patient Specific Functional Scale . Retrieved from [email or web address]   COGNITION: Overall cognitive status: Within functional limits for tasks assessed     SENSATION: Hypersensitive in L TFL area.  Pt reports she has a diagnosis of Fibromyalgia making her sensitive.  MUSCLE LENGTH: Hamstrings: Right 90 deg; Left 70 deg  POSTURE: L scap winging, L UE flexed, flattened lumbar lordosis  PALPATION: 2+ tenderness at L TFL and GT  LUMBAR ROM:   AROM eval  Flexion 50%  Extension 25%  Right lateral flexion 75%  Left lateral flexion 100%  Right rotation 75%  Left rotation 100%   (End range pain throughout)  LOWER EXTREMITY ROM:     A/P Right eval Left eval  Hip flexion  75 Pain  Hip extension    Hip abduction    Hip adduction    Hip internal rotation  WFL  Hip external rotation  [facility]  Knee flexion    Knee extension    Ankle dorsiflexion    Ankle plantarflexion    Ankle inversion    Ankle eversion     (Blank rows = not tested)  LOWER EXTREMITY MMT:    MMT Right eval Left eval  Hip flexion 4+ 4  Hip extension    Hip abduction 4+ 4-  Hip adduction 4+ 4  Hip internal rotation    Hip external rotation    Knee flexion    Knee extension 5- 4  Ankle dorsiflexion    Ankle plantarflexion 4+ 4+  Ankle inversion    Ankle eversion     (Blank rows = not tested)  LUMBAR SPECIAL TESTS:  Straight leg raise test: Negative and FABER test: Positive  FUNCTIONAL TESTS: not completed  GAIT: Distance  walked: Orthoptist utilized: None Level of assistance: Complete Independence Comments: Altered gait with no AD due to remaining spastcity on the left  TREATMENT DATE:  11/27/23***     11/08/23 Evaluation completed f/b percussive device work at L gluteal and instruction in HEP.                                                                                                                                PATIENT EDUCATION:  Education details: HEP Person educated: Patient Education method: Explanation, Demonstration, Actor cues, and Verbal cues Education comprehension: verbalized understanding, returned demonstration, verbal cues required, and tactile cues required  HOME EXERCISE PROGRAM: Access Code: [email or web address] URL: [email or web address] Date: 11/08/2023 Prepared by: Burnard Meth  Exercises - Lower Trunk Rotations  - 2 x daily - 7 x weekly - 2 sets - 10 reps - Supine Single Knee to Chest Stretch  - 2 x daily - 7 x weekly - 1 sets - 3 reps - 25 hold - Seated Piriformis Stretch with Trunk Bend  - 2 x daily - 7 x weekly - 1 sets - 3 reps - 25 hold - Supine Figure 4 Piriformis Stretch  - 2 x daily - 7 x weekly - 1 sets - 3 reps - 25 hold  ASSESSMENT:  CLINICAL IMPRESSION: ***Patient is a 68 y.o. female who was seen today for physical therapy evaluation and treatment for L buttuck pain restricting her sitting, standing and walking. She presents with pain, altered gait, decreased strength in LE, decreased ROM  and decreased flexibility.  She would benefit from skilled PT to address these deficits and return to pain free activities.  OBJECTIVE IMPAIRMENTS: Abnormal gait, decreased activity tolerance, decreased balance, decreased endurance, difficulty walking, decreased ROM, decreased strength, impaired flexibility, and pain.   ACTIVITY LIMITATIONS: bending, sitting, standing, squatting, stairs, and transfers  PARTICIPATION LIMITATIONS: community activity and  yard work  PERSONAL FACTORS: 1 comorbidity: CVA are also affecting patient's functional outcome.   REHAB POTENTIAL: Fair see above  CLINICAL DECISION MAKING: Stable/uncomplicated  EVALUATION COMPLEXITY: Moderate   GOALS: Goals reviewed with patient? Yes  SHORT TERM GOALS: Target date: 12/06/2023***    Pt to be independent with HEP. Baseline: Goal status: INITIAL  2.  Decrease pain by 1 level. Baseline:  Goal status: INITIAL  LONG TERM GOALS: Target date: 01/03/2024    Increase ROM by 25%. Baseline:  Goal status: INITIAL  2.  Increase strength by at least a 1/2 grade. Baseline:  Goal status: INITIAL  3.  Decrease max pain to 2/10 with all activities. Baseline:  Goal status: INITIAL  4.  Increase PSFS score by 2 to show improved functional activities. Baseline:  Goal status: INITIAL PLAN:  PT FREQUENCY: 1-2x/week  PT DURATION: 8 weeks  PLANNED INTERVENTIONS: 97164- PT Re-evaluation, 97110-Therapeutic exercises, 97530- Therapeutic activity, [id number]- Neuromuscular re-education, 97535- Self Care, [id number]- Manual therapy, [id number]- Gait training, [phone]- Aquatic Therapy, [phone]- Electrical stimulation (manual), Patient/Family  education, Balance training, Stair training, Joint mobilization, Cryotherapy, and Moist heat.  PLAN FOR NEXT SESSION: ***Balance assessment   Burnard CHRISTELLA Meth, PT 12/11/2023, 4:20 PM

## 2023-12-12 ENCOUNTER — Encounter

## 2023-12-13 ENCOUNTER — Telehealth: Payer: Self-pay

## 2023-12-13 NOTE — Telephone Encounter (Signed)
 Spoke with pt about missed appointment on 12/12/23 for PT.  She stated she thought she cancelled future appointments and doesn't need PT.  Patient was told to call back if she feels something changes and she needs PT.

## 2023-12-19 ENCOUNTER — Encounter

## 2024-01-15 ENCOUNTER — Encounter: Payer: Self-pay | Admitting: Radiology

## 2024-04-11 ENCOUNTER — Encounter (HOSPITAL_COMMUNITY): Payer: Self-pay

## 2024-04-11 ENCOUNTER — Emergency Department (HOSPITAL_COMMUNITY)

## 2024-04-11 ENCOUNTER — Emergency Department (HOSPITAL_COMMUNITY)
Admission: EM | Admit: 2024-04-11 | Discharge: 2024-04-11 | Disposition: A | Discharge status: Home / Self Care | Attending: Emergency Medicine | Admitting: Emergency Medicine

## 2024-04-11 ENCOUNTER — Other Ambulatory Visit: Payer: Self-pay

## 2024-04-11 DIAGNOSIS — Z7952 Long term (current) use of systemic steroids: Secondary | ICD-10-CM | POA: Insufficient documentation

## 2024-04-11 DIAGNOSIS — Z79899 Other long term (current) drug therapy: Secondary | ICD-10-CM | POA: Insufficient documentation

## 2024-04-11 DIAGNOSIS — R0602 Shortness of breath: Secondary | ICD-10-CM | POA: Diagnosis present

## 2024-04-11 DIAGNOSIS — E876 Hypokalemia: Secondary | ICD-10-CM | POA: Insufficient documentation

## 2024-04-11 DIAGNOSIS — I10 Essential (primary) hypertension: Secondary | ICD-10-CM | POA: Insufficient documentation

## 2024-04-11 DIAGNOSIS — J441 Chronic obstructive pulmonary disease with (acute) exacerbation: Secondary | ICD-10-CM | POA: Diagnosis not present

## 2024-04-11 DIAGNOSIS — Z7951 Long term (current) use of inhaled steroids: Secondary | ICD-10-CM | POA: Insufficient documentation

## 2024-04-11 LAB — BASIC METABOLIC PANEL WITH GFR
Anion gap: 16 — ABNORMAL HIGH (ref 5–15)
Anion gap: 14 (ref 5–15)
BUN: 11 mg/dL (ref 8–23)
BUN: 10 mg/dL (ref 8–23)
CO2: 22 mmol/L (ref 22–32)
CO2: 22 mmol/L (ref 22–32)
Calcium: 9.1 mg/dL (ref 8.9–10.3)
Calcium: 8.7 mg/dL — ABNORMAL LOW (ref 8.9–10.3)
Chloride: 105 mmol/L (ref 98–111)
Chloride: 104 mmol/L (ref 98–111)
Creatinine, Ser: 0.72 mg/dL (ref 0.44–1.00)
Creatinine, Ser: 0.65 mg/dL (ref 0.44–1.00)
GFR, Estimated: >60 mL/min
GFR, Estimated: >60 mL/min
Glucose, Bld: 150 mg/dL — ABNORMAL HIGH (ref 70–99)
Glucose, Bld: 99 mg/dL (ref 70–99)
Potassium: 2.7 mmol/L — CL (ref 3.5–5.1)
Potassium: 3.2 mmol/L — ABNORMAL LOW (ref 3.5–5.1)
Sodium: 143 mmol/L (ref 135–145)
Sodium: 140 mmol/L (ref 135–145)

## 2024-04-11 LAB — CBC
HCT: 44.4 % (ref 36.0–46.0)
Hemoglobin: 14.3 g/dL (ref 12.0–15.0)
MCH: 30.2 pg (ref 26.0–34.0)
MCHC: 32.2 g/dL (ref 30.0–36.0)
MCV: 93.7 fL (ref 80.0–100.0)
Platelets: 275 10*3/uL (ref 150–400)
RBC: 4.74 MIL/uL (ref 3.87–5.11)
RDW: 12.6 % (ref 11.5–15.5)
WBC: 6.6 10*3/uL (ref 4.0–10.5)
nRBC: 0 % (ref 0.0–0.2)

## 2024-04-11 LAB — RESP PANEL BY RT-PCR (RSV, FLU A&B, COVID)  RVPGX2
Influenza A by PCR: NEGATIVE
Influenza B by PCR: NEGATIVE
Resp Syncytial Virus by PCR: NEGATIVE
SARS Coronavirus 2 by RT PCR: NEGATIVE

## 2024-04-11 MED ORDER — IPRATROPIUM-ALBUTEROL 0.5-2.5 (3) MG/3ML IN SOLN
3.0000 mL | Freq: Once | RESPIRATORY_TRACT | Status: AC
  Administered 2024-04-11: 3 mL via RESPIRATORY_TRACT
  Filled 2024-04-11: qty 3

## 2024-04-11 MED ORDER — POTASSIUM CHLORIDE 10 MEQ/100ML IV SOLN
10.0000 meq | Freq: Once | INTRAVENOUS | Status: AC
  Administered 2024-04-11: 10 meq via INTRAVENOUS
  Filled 2024-04-11: qty 100

## 2024-04-11 MED ORDER — SODIUM CHLORIDE 0.9 % IV BOLUS
500.0000 mL | Freq: Once | INTRAVENOUS | Status: AC
  Administered 2024-04-11: 500 mL via INTRAVENOUS

## 2024-04-11 MED ORDER — AMOXICILLIN-POT CLAVULANATE 875-125 MG PO TABS
1.0000 | ORAL_TABLET | Freq: Once | ORAL | Status: AC
  Administered 2024-04-11: 1 via ORAL
  Filled 2024-04-11: qty 1

## 2024-04-11 MED ORDER — POTASSIUM CHLORIDE 20 MEQ PO PACK
80.0000 meq | PACK | Freq: Every day | ORAL | Status: DC
  Filled 2024-04-11 (×2): qty 4

## 2024-04-11 MED ORDER — AMOXICILLIN-POT CLAVULANATE 875-125 MG PO TABS: 1.0000 | ORAL_TABLET | Freq: Two times a day (BID) | ORAL | 0 refills | Status: AC

## 2024-04-11 NOTE — ED Notes (Signed)
 Patient maintains appropriate oxygenation while ambulating. Provider made aware.

## 2024-04-11 NOTE — ED Provider Notes (Signed)
 " [place] EMERGENCY DEPARTMENT AT [facility] Provider Note   CSN: [id number] Arrival date & time: 04/11/24  9580     Patient presents with: Shortness of Breath   Carla Bowen is a 69 y.o. female with history of hypertension, thalamic pain syndrome, alcohol abuse, COPD.  Presents to ED complaining of shortness of breath.  Patient reports that for the last 1 week she has had head cold to include sinus pressure and congestion.  She was seen at PCP today who prescribed her Z-Pak.  States that for the last 3 days she has been more short of breath.  She denies any chest pain.  Denies lightheadedness or dizziness.  Denies nausea or vomiting.  States that she has history of COPD but reports that she does not feel as if she truly has this because she has not had a cigarette in 5 days.  She was apparently picked up by EMS with oxygen saturation 80% room air.  Patient received DuoNeb, albuterol  treatment, mag.  On arrival to ED, patient reports that her shortness of breath has resolved.  She was not given steroids by EMS due to the patient reporting that she is allergic to steroids.  When asked about her reaction, she reports that she gets very moody and breaks out in hives.  She was not given steroids by EMS and she adamantly refuses excepting them here in the department.  Workup initiated.   Shortness of Breath      Prior to Admission medications  Medication Sig Start Date End Date Taking? Authorizing Provider  baclofen  (LIORESAL ) 10 MG tablet Take 10 mg by mouth 3 (three) times daily as needed. 10/24/19   [provider]  Cholecalciferol (VITAMIN D3) 50 MCG (2000 UT) TABS Take by mouth.    [provider]  Fluticasone -Umeclidin-Vilant (TRELEGY ELLIPTA) 100-62.5-25 MCG/INH AEPB Inhale into the lungs.    [provider]  gabapentin  (NEURONTIN ) 100 MG capsule Take 100 mg by mouth 3 (three) times daily.    [provider]  gabapentin  (NEURONTIN ) 600  MG tablet Take 600 mg by mouth at bedtime.    [provider]  levocetirizine (XYZAL) 5 MG tablet  12/05/18   [provider]  linaclotide (LINZESS) 72 MCG capsule Take 72 mcg by mouth daily before breakfast.    [provider]  losartan (COZAAR) 100 MG tablet Take 100 mg by mouth daily. 01/06/20   [provider]  meclizine  (ANTIVERT ) 25 MG tablet Take 1 tablet (25 mg total) by mouth 3 (three) times daily as needed for dizziness. 01/02/20   Nivia Colon, PA-C  methylPREDNISolone  (MEDROL  DOSEPAK) 4 MG TBPK tablet Take as directed 10/03/23   Jerri Kay CHRISTELLA, MD  metoprolol  tartrate (LOPRESSOR ) 25 MG tablet Take 25 mg by mouth 2 (two) times daily. 03/21/20   [provider]  nortriptyline  (PAMELOR ) 10 MG capsule TAKE 1 CAPSULE BY MOUTH EVERYDAY AT BEDTIME 06/04/22   Kirsteins, Prentice BRAVO, MD  omeprazole (PRILOSEC) 40 MG capsule Take 40 mg by mouth daily.    [provider]  potassium chloride  (KLOR-CON  M) 10 MEQ tablet Take 10 mEq by mouth 2 (two) times daily.    [provider]  rosuvastatin  (CRESTOR ) 10 MG tablet Take 10 mg by mouth daily.    [provider]    Allergies: Sulfa antibiotics, Ciprofloxacin , Codeine, and Tramadol     Review of Systems  Respiratory:  Positive for shortness of breath.   All other systems reviewed and  are negative.   Updated Vital Signs BP (!) 128/58   Pulse 88   Temp 97.8 F (36.6 C) (Oral)   Resp 20   Ht 5' 1 (1.549 m)   Wt 49.4 kg   LMP  (LMP Unknown)   SpO2 96%   BMI 20.60 kg/m   Physical Exam Vitals and nursing note reviewed.  Constitutional:      General: She is not in acute distress.    Appearance: She is well-developed.  HENT:     Head: Normocephalic and atraumatic.  Eyes:     Conjunctiva/sclera: Conjunctivae normal.  Cardiovascular:     Rate and Rhythm: Normal rate and regular rhythm.     Heart sounds: No murmur heard. Pulmonary:     Effort: Pulmonary effort is normal. No  respiratory distress.     Breath sounds: Normal breath sounds.     Comments: Decreased breath sounds, no wheezing Abdominal:     Palpations: Abdomen is soft.     Tenderness: There is no abdominal tenderness.  Musculoskeletal:        General: No swelling.     Cervical back: Neck supple.  Skin:    General: Skin is warm and dry.     Capillary Refill: Capillary refill takes less than 2 seconds.  Neurological:     Mental Status: She is alert.  Psychiatric:        Mood and Affect: Mood normal.     (all labs ordered are listed, but only abnormal results are displayed) Labs Reviewed  BASIC METABOLIC PANEL WITH GFR - Abnormal; Notable for the following components:      Result Value   Potassium 2.7 (*)    Glucose, Bld 150 (*)    Anion gap 16 (*)    All other components within normal limits  RESP PANEL BY RT-PCR (RSV, FLU A&B, COVID)  [id number]  CBC    EKG: EKG Interpretation Date/Time:  Thursday April 11 2024 05:17:16 EST Ventricular Rate:  84 PR Interval:  137 QRS Duration:  85 QT Interval:  403 QTC Calculation: 477 R Axis:   51  Text Interpretation: Sinus rhythm Atrial premature complexes Minimal ST depression Artifact in lead(s) I III aVR aVL aVF V2 V3 Confirmed by Trine Likes [phone]) on 04/11/2024 5:33:06 AM  Radiology: [facility] Chest 2 View Result Date: 04/11/2024 EXAM: 2 VIEW(S) XRAY OF THE CHEST 04/11/2024 05:30:00 AM COMPARISON: 11/06/21. CLINICAL HISTORY: Shortness of breath. FINDINGS: LUNGS AND PLEURA: No focal pulmonary opacity. No pleural effusion. No pneumothorax. HEART AND MEDIASTINUM: Aortic atherosclerotic plaque. No acute abnormality of the cardiac and mediastinal silhouettes. BONES AND SOFT TISSUES: No acute osseous abnormality. IMPRESSION: 1. No acute findings. Electronically signed by: Waddell Calk MD 04/11/2024 06:15 AM EST RP Workstation: [id number]    Procedures   Medications Ordered in the ED  potassium chloride  (KLOR-CON ) packet 80 mEq (has no  administration in time range)  potassium chloride  10 mEq in 100 mL IVPB (10 mEq Intravenous New Bag/Given 04/11/24 0627)  ipratropium-albuterol  (DUONEB) 0.5-2.5 (3) MG/3ML nebulizer solution 3 mL (3 mLs Nebulization Given 04/11/24 0552)  sodium chloride  0.9 % bolus 500 mL (500 mLs Intravenous New Bag/Given 04/11/24 0627)    Clinical Course as of 04/11/24 0642  Thu Apr 11, 2024  0604 Hypokalemic most likely due to shift of potassium intracellularly due to albuterol . Usually is hypokalemic. Will treat with oral 80 packet, 10 iv, roadtest to assess for hypoxia, repeat bmp if not hypoxic to ensure hypokalemia has resolved. [CG]  Clinical Course User Index [CG] Ruthell Lonni FALCON, PA-C    Medical Decision Making Amount and/or Complexity of Data Reviewed Labs: ordered. Radiology: ordered.  Risk Prescription drug management.   A 69 year old female presents for evaluation of shortness of breath.  On exam HD stable.  No tachycardia, afebrile.  Lung sounds decreased bilaterally, no wheezing, no accessory muscle use.  Abdomen soft and compressible.  Neuroexam at baseline.  No edema bilateral lower extremities.  Patient received 2 albuterol  treatments, DuoNebs, magnesium  with EMS.  Arrives with no wheezing but does have decreased breath sounds.  CBC here without leukocytosis or anemia.  Metabolic panel with potassium 2.7, anion gap 16.  Patient is usually hypokalemic but never as low as 2.7.  Most likely due to albuterol  use, causing potassium to shift intracellularly.  Will replete her potassium with oral packet of 80 mEq, IV run of 10 mEq.  Patient will then be road tested to assess for hypoxia.  If patient is hypoxic, requires admission.  If no hypoxia, will repeat BMP to ensure that hypokalemia has improved.  At end of shift, patient workup not complete.  Signed out to oncoming provider Hoy RIGGERS pending reevaluation.  Plan of management discussed.   Final diagnoses:  COPD  exacerbation [facility])    ED Discharge Orders     None          Ruthell Lonni FALCON RIGGERS 04/11/24 [id number]    Trine Raynell Moder, MD 04/11/24 [phone]  "

## 2024-04-11 NOTE — ED Triage Notes (Signed)
 Pt BIB [facility] from home for SOB, was seen at PCP yesterday & given a Zpak however hasn't gotten any better.  EMS administered 2 duoneb, 1 albuterol  tx, 2g of mag via 18g in RAC.  Room air 88% On neb tx 100% BP 120/70 HR 86 Pt states she feels much better after neb tx & that her PCP called her in a neb machine yesterday but she hadn't gotten it yet.

## 2024-04-11 NOTE — Discharge Instructions (Signed)
 Please continue taking the entire course of azithromycin .  You can start taking the Augmentin  course today as well as prescribed.  Please follow-up with your primary care provider in the next 48-72 hours.  Seek emergency care if experiencing any new or worsening symptoms.

## 2024-04-11 NOTE — ED Provider Notes (Signed)
 Accepted handoff at shift change from [place] PA-C. Please see prior provider note for more detail.   Briefly: Patient is 69 y.o. Presents to ED complaining of shortness of breath. Patient reports that for the last 1 week she has had head cold to include sinus pressure and congestion. She was seen at PCP today who prescribed her Z-Pak. States that for the last 3 days she has been more short of breath. She denies any chest pain. Denies lightheadedness or dizziness. Denies nausea or vomiting. States that she has history of COPD but reports that she does not feel as if she truly has this because she has not had a cigarette in 5 days. She was apparently picked up by EMS with oxygen saturation 80% room air. Patient received DuoNeb, albuterol  treatment, mag. On arrival to ED, patient reports that her shortness of breath has resolved. She was not given steroids by EMS due to the patient reporting that she is allergic to steroids. When asked about her reaction, she reports that she gets very moody and breaks out in hives. She was not given steroids by EMS and she adamantly refuses excepting them here in the department. Workup initiated.   Plan:  - dispo pending ambulation trial and repeat BMP. - Patient ambulated with O2 >96% on RA.  Patient stating that she is feeling a lot better and her lungs are also sounding clear.  Patient's repeat BMP is now mildly hypokalemic at 3.2 after IV potassium supplementation in ED (which is near patient's baseline).  Patient stated that she takes daily potassium supplementation and agrees to continue this and follow-up with PCP for repeat labs.   - CBC without leukocytosis or anemia.  Respiratory panel negative.  Chest x-ray without acute process. - shared all results with patient.  Answered all questions.  Patient requesting to go home stating that she feels a lot better.  Educated patient that standard of care for COPD exacerbation is oral steroids and antibiotics.  Patient  refusing steroids stating that she cannot tolerate them.  Patient does agree to finish azithromycin  course and start taking Augmentin  as well which I have prescribed.  Patient agrees to follow-up with PCP in the next couple of days. - Patient afebrile with stable vitals.  Provided with return precautions.  Discharged in good condition.    Hoy Nidia FALCON, [place] 04/11/24 1010    Trine Raynell Moder, [place] 04/15/24 [phone]

## 2024-04-11 NOTE — ED Notes (Signed)
 1x ambulated patient to restroom.
